# Patient Record
Sex: Male | Born: 1940
Health system: Southern US, Community
[De-identification: ages and names within clinical notes are randomized; demographics above are authoritative.]

## PROBLEM LIST (undated history)

## (undated) DIAGNOSIS — I2510 Atherosclerotic heart disease of native coronary artery without angina pectoris: Secondary | ICD-10-CM

## (undated) DIAGNOSIS — E7800 Pure hypercholesterolemia, unspecified: Secondary | ICD-10-CM

## (undated) DIAGNOSIS — R001 Bradycardia, unspecified: Secondary | ICD-10-CM

## (undated) DIAGNOSIS — I5189 Other ill-defined heart diseases: Secondary | ICD-10-CM

## (undated) DIAGNOSIS — H579 Unspecified disorder of eye and adnexa: Secondary | ICD-10-CM

## (undated) DIAGNOSIS — U071 COVID-19: Secondary | ICD-10-CM

## (undated) DIAGNOSIS — K635 Polyp of colon: Secondary | ICD-10-CM

## (undated) DIAGNOSIS — G4733 Obstructive sleep apnea (adult) (pediatric): Secondary | ICD-10-CM

## (undated) DIAGNOSIS — R918 Other nonspecific abnormal finding of lung field: Secondary | ICD-10-CM

## (undated) DIAGNOSIS — H9190 Unspecified hearing loss, unspecified ear: Secondary | ICD-10-CM

## (undated) DIAGNOSIS — M25519 Pain in unspecified shoulder: Secondary | ICD-10-CM

## (undated) DIAGNOSIS — K449 Diaphragmatic hernia without obstruction or gangrene: Secondary | ICD-10-CM

## (undated) DIAGNOSIS — R600 Localized edema: Secondary | ICD-10-CM

## (undated) DIAGNOSIS — K219 Gastro-esophageal reflux disease without esophagitis: Secondary | ICD-10-CM

## (undated) DIAGNOSIS — R42 Dizziness and giddiness: Secondary | ICD-10-CM

## (undated) DIAGNOSIS — N419 Inflammatory disease of prostate, unspecified: Secondary | ICD-10-CM

## (undated) DIAGNOSIS — N400 Enlarged prostate without lower urinary tract symptoms: Secondary | ICD-10-CM

## (undated) DIAGNOSIS — I10 Essential (primary) hypertension: Secondary | ICD-10-CM

## (undated) HISTORY — PX: CORONARY ANGIOPLASTY: SHX604

## (undated) HISTORY — DX: Obstructive sleep apnea (adult) (pediatric): G47.33

## (undated) HISTORY — DX: Localized edema: R60.0

## (undated) HISTORY — DX: Dizziness and giddiness: R42

## (undated) HISTORY — DX: COVID-19: U07.1

## (undated) HISTORY — PX: EYE SURGERY: SHX253

## (undated) HISTORY — DX: Inflammatory disease of prostate, unspecified: N41.9

## (undated) HISTORY — DX: Polyp of colon: K63.5

## (undated) HISTORY — PX: OTHER SURGICAL HISTORY: SHX169

## (undated) HISTORY — DX: Essential (primary) hypertension: I10

## (undated) HISTORY — DX: Pain in unspecified shoulder: M25.519

## (undated) HISTORY — PX: TRANSURETHRAL RESECTION OF PROSTATE: SHX73

---

## 1999-04-28 ENCOUNTER — Ambulatory Visit (HOSPITAL_COMMUNITY): Admission: RE | Admit: 1999-04-28 | Discharge: 1999-04-28 | Payer: Self-pay | Admitting: Gastroenterology

## 1999-04-28 ENCOUNTER — Encounter (INDEPENDENT_AMBULATORY_CARE_PROVIDER_SITE_OTHER): Payer: Self-pay | Admitting: Specialist

## 2000-03-03 ENCOUNTER — Ambulatory Visit (HOSPITAL_COMMUNITY): Admission: RE | Admit: 2000-03-03 | Discharge: 2000-03-03 | Payer: Self-pay | Admitting: Otolaryngology

## 2000-03-03 ENCOUNTER — Encounter: Payer: Self-pay | Admitting: Otolaryngology

## 2000-12-12 ENCOUNTER — Inpatient Hospital Stay (HOSPITAL_COMMUNITY): Admission: EM | Admit: 2000-12-12 | Discharge: 2000-12-13 | Payer: Self-pay | Admitting: Emergency Medicine

## 2000-12-12 ENCOUNTER — Encounter: Payer: Self-pay | Admitting: Cardiology

## 2001-10-12 ENCOUNTER — Encounter (HOSPITAL_BASED_OUTPATIENT_CLINIC_OR_DEPARTMENT_OTHER): Payer: Self-pay | Admitting: Internal Medicine

## 2001-10-12 ENCOUNTER — Encounter: Admission: RE | Admit: 2001-10-12 | Discharge: 2001-10-12 | Payer: Self-pay | Admitting: Internal Medicine

## 2003-05-02 ENCOUNTER — Emergency Department (HOSPITAL_COMMUNITY): Admission: AD | Admit: 2003-05-02 | Discharge: 2003-05-03 | Payer: Self-pay

## 2003-05-02 ENCOUNTER — Encounter: Payer: Self-pay | Admitting: Emergency Medicine

## 2003-05-26 ENCOUNTER — Ambulatory Visit (HOSPITAL_COMMUNITY): Admission: RE | Admit: 2003-05-26 | Discharge: 2003-05-26 | Payer: Self-pay | Admitting: Internal Medicine

## 2003-05-26 ENCOUNTER — Encounter (HOSPITAL_BASED_OUTPATIENT_CLINIC_OR_DEPARTMENT_OTHER): Payer: Self-pay | Admitting: Internal Medicine

## 2003-09-25 ENCOUNTER — Emergency Department (HOSPITAL_COMMUNITY): Admission: AD | Admit: 2003-09-25 | Discharge: 2003-09-25 | Payer: Self-pay | Admitting: Family Medicine

## 2006-10-31 ENCOUNTER — Encounter: Admission: RE | Admit: 2006-10-31 | Discharge: 2006-10-31 | Payer: Self-pay | Admitting: Internal Medicine

## 2007-01-13 ENCOUNTER — Emergency Department (HOSPITAL_COMMUNITY): Admission: EM | Admit: 2007-01-13 | Discharge: 2007-01-13 | Payer: Self-pay | Admitting: Family Medicine

## 2007-12-27 ENCOUNTER — Encounter: Admission: RE | Admit: 2007-12-27 | Discharge: 2007-12-27 | Payer: Self-pay | Admitting: Internal Medicine

## 2010-11-19 ENCOUNTER — Ambulatory Visit
Admission: RE | Admit: 2010-11-19 | Discharge: 2010-11-19 | Disposition: A | Discharge status: Home / Self Care | Payer: 59 | Source: Ambulatory Visit | Attending: Urology | Admitting: Urology

## 2010-11-19 ENCOUNTER — Other Ambulatory Visit: Payer: Self-pay | Admitting: Urology

## 2010-11-19 DIAGNOSIS — N400 Enlarged prostate without lower urinary tract symptoms: Secondary | ICD-10-CM

## 2010-11-24 ENCOUNTER — Other Ambulatory Visit: Payer: Self-pay | Admitting: Urology

## 2010-11-24 ENCOUNTER — Ambulatory Visit (HOSPITAL_BASED_OUTPATIENT_CLINIC_OR_DEPARTMENT_OTHER)
Admission: RE | Admit: 2010-11-24 | Discharge: 2010-11-25 | Disposition: A | Discharge status: Home / Self Care | Payer: 59 | Source: Ambulatory Visit | Attending: Urology | Admitting: Urology

## 2010-11-24 DIAGNOSIS — N138 Other obstructive and reflux uropathy: Secondary | ICD-10-CM | POA: Insufficient documentation

## 2010-11-24 DIAGNOSIS — N401 Enlarged prostate with lower urinary tract symptoms: Secondary | ICD-10-CM | POA: Insufficient documentation

## 2010-11-24 DIAGNOSIS — Z01812 Encounter for preprocedural laboratory examination: Secondary | ICD-10-CM | POA: Insufficient documentation

## 2010-12-14 NOTE — Op Note (Signed)
[name], [name]             ACCOUNT NO.:  [id number]  MEDICAL RECORD NO.:  [id number]          PATIENT TYPE:  AMB  LOCATION:                               FACILITY:  [facility]  PHYSICIAN:  Bertram Millard. Ulice Follett, M.D.DATE OF BIRTH:  02/14/1941  DATE OF PROCEDURE:  11/24/2010 DATE OF DISCHARGE:                              OPERATIVE REPORT   POSTOPERATIVE DIAGNOSIS:  Benign prostatic hypertrophy with obstruction, symptomatic.  POSTOPERATIVE DIAGNOSIS:  Benign prostatic hypertrophy with obstruction, symptomatic.  PRINCIPLE PROCEDURES:  Transurethral resection of the prostate with gyrus.  SURGEON:  Bertram Millard. Burdette Forehand, M.D.  ANESTHESIA:  General with LMA.  COMPLICATIONS:  None.  SPECIMEN:  Prostate chips to Pathology.  ESTIMATED BLOOD LOSS:  Less than 300 cc.  DRAINS:  24-French three-way Foley catheter.  BRIEF HISTORY:  [name] is a nice 70 year old male who I have been following for years.  He had symptomatic BPH, and underwent TUNA procedure in August 2004.  The patient has had symptomatic worsening, and has been on medical therapy.  Because of this large gland, and his worsening symptoms, he has desired surgical management so he can get off the medications.  The risks and complications of TURP have been discussed with the patient and his wife.  They understand these and desire to proceed.  DESCRIPTION OF PROCEDURE:  The patient was identified in the holding area and received preoperative IV antibiotics.  He was taken to the operating room where general anesthetic was administered using LMA.  He was placed in the dorsal lithotomy position.  Genitalia and perineum were prepped and draped.  Time-out was then performed.  The procedure then commenced.  A 28-French resectoscope sheath was placed with the obturator.  Inspection of the bladder revealed some trabeculations, the ureteral orifices were carefully identified in their position to the proximity of the bladder  neck.  The rest of the bladder was found to be normal.  Resection was then performed, first in the midline with taking down the median lobe which was somewhat small.  The cutting loop was used with the gyrus device.  The resection was taken down to the capsular fibers between the 5 o'clock and 7 o'clock position.  I then turned resection anteriorly to the 12 o'clock position.  I then moved from the 12 o'clock position counterclockwise, resecting the right prostatic lobe, again down to capsular fibers.  Some small perforations were made and some periprostatic fat was identified and small little bleeders coagulated.  The majority of the right lobe was taken down, carefully down to the surgical capsule.  I then performed the same procedure on the left lateral lobe, which was somewhat smaller than the right.  Again, resection was taken down to the surgical capsule.  Small bleeders were carefully electrocoagulated.  The chips were evacuated from the bladder.  Following completion of the resection, the fossa was neatly trimmed, and then all small bleeders carefully coagulated.  There was some bleeding from the sinus vessels on the right lobe in the small capsular perforation.  All chips were then irrigated again from the bladder, the prostate was found to be hemostatic and  the scope was removed.  I placed a 24-French three-way Foley catheter and filled the balloon with 45 cc of water.  This was then hooked to dependent drainage with overhead irrigation, with a slight amount of traction.  The patient tolerated the procedure well.  He was awakened and taken to the PACU in stable condition.  Chips were sent to Pathology for permanent section.     Bertram Millard. Macai Sisneros, M.D.     SMD/MEDQ  D:  11/24/2010  T:  11/24/2010  Job:  [id number]  cc:   Dr. Ivery Quale  Electronically Signed by Marcine Matar M.D. on 12/14/2010 01:20:33 PM

## 2011-01-14 NOTE — Cardiovascular Report (Signed)
[place]. [facility]  Patient:    [name], [name]                        MRN: [id number] Proc. Date: 12/13/00 Adm. Date:  [id number] Disc. Date: [id number] Attending:  Eleanora Neighbor CC:         Dr. Ivery Quale   Cardiac Catheterization  PROCEDURE:  Left heart catheterization with selective coronary angiography, left ventricular angiography.  INDICATIONS:  [name] presents with anterior chest pain.  He had myocardial infarction ruled out and is here for cardiac catheterization.  TYPE AND SITE OF ENTRY:  Percutaneous right femoral artery.  CATHETERS:  A 6 French 4 Judkins curved right and left coronary catheters, 6 French pigtail ventriculographic catheter.  CONTRAST MATERIAL:  Omnipaque.  MEDICATIONS GIVEN PRIOR TO THE PROCEDURE:  Valium 10 mg p.o.  MEDICATIONS GIVEN DURING THE PROCEDURE:  Versed 2 mg IV.  COMMENTS:  The patient tolerated the procedure well.  HEMODYNAMIC DATA:  The aortic pressure was 99/61, LV pressure was 102/17. There was no aortic valve gradient noted on pullback.  ANGIOGRAPHIC DATA:  The left ventricular angiogram was performed in the RAO position.  Overall, cardiac size and silhouette are normal.  Left ventricular function is normal.  The global ejection fraction is 60-65%.  A arteriogram demonstrated puncture that was probably just superior to the bifurcation of the femoral artery.  It was felt that the Perclose was not necessarily appropriate.  CORONARY ARTERIES: 1. Right coronary artery:  The right coronary artery has minor irregularities but is essentially normal.  It is a large dominant vessel.  2. Left main:  The left main coronary artery is normal.  3. Left anterior descending:  The left anterior descending has minor irregularities proximally but is essentially normal.  4. Left circumflex:  The left circumflex is normal.  5. Intermediate coronary:  The intermediate coronary has no  significant obstructive disease and is essentially normal.  OVERALL IMPRESSION: 1. Normal left ventricular function. 2. Essentially normal coronary arteries with minimal early atherosclerotic    irregularity.  PLAN:  It is felt that [name] is at low risk for coronary events.  We will continue to encourage him to modify cardiovascular risk factors. DD:  12/13/00 TD:  12/14/00 Job: [id number]

## 2011-01-14 NOTE — Discharge Summary (Signed)
[place]. [facility]  Patient:    CORNELIS, KLUVER                        MRN: [id number] Adm. Date:  [id number] Disc. Date: [id number] Attending:  Eleanora Neighbor Dictator:   Jennet Maduro. Earl Gala, R.N., A.N.P. CC:         Dr. Ivery Quale                           Discharge Summary  HISTORY OF PRESENT ILLNESS:  Mr. Cuthrell is a 70 year old white male who has basically been in good physical health with really no previous cardiac problems, who presents to the hospital with a one-week history of a sharp substernal chest discomfort which has been somewhat off and on.  He noted on the day prior to admission that he felt like his left arm was somewhat asleep. There was really no shortness of breath but he has had some relation with his discomfort in regard with exercise over the previous weekend.  There has been no real radiation of the discomfort, no clamminess, no nausea, no vomiting. He was treated with nitroglycerin in the emergency room with prompt relief. He was subsequently admitted for further evaluation.  PAST MEDICAL HISTORY:  This is significant for vasectomy, BPH, and gastroesophageal reflux disease.  LABORATORY DATA ON ADMISSION:  Cardiac enzymes were negative.  EKG showed normal sinus rhythm, it was nonacute.  CBC was all within normal limits. Chemistries were all within normal limits as well.  Guaiac stool was negative.  A portable chest x-ray showed no acute pulmonary disease.  HOSPITAL COURSE:  The patient was admitted to telemetry.  He was placed on IV nitroglycerin as well as IV heparin.  We preceded on with cardiac catheterization the following day per Colleen Can. Deborah Chalk, M.D.  This procedure was tolerated well without any known complications.  Left ventricular function is normal. The coronaries demonstrate only minor irregularities.  There is no frank obstructive disease.  His symptoms are not felt to be from a cardiac origin and he is  felt to be stable from a cardiac standpoint.  Post procedure he was transferred back to 5100.  His nitroglycerin and heparin were discontinued and plans were now, after his bedrest is complete and if the groin remains stable, he will be stable for discharge this afternoon.  CONDITION ON DISCHARGE:  Stable.  DISCHARGE MEDICATIONS:  Have advised him to take a coated aspirin daily.  He may take Tylenol as needed for any discomfort.  He may resume his Proscar and Flomax as he was taking before.  ACTIVITY:  Light.  He is not to return to work until this coming Friday.  DIET:  Low fat.  FOLLOW-UP:  He is to call our office for any problems with his groin.  That number is made available to him.  Otherwise, will have him plan to follow up with Dr. Ivery Quale in the next one to two weeks. DD:  12/13/00 TD:  12/14/00 Job: [id number]

## 2011-02-23 ENCOUNTER — Ambulatory Visit (HOSPITAL_BASED_OUTPATIENT_CLINIC_OR_DEPARTMENT_OTHER)
Admission: RE | Admit: 2011-02-23 | Discharge: 2011-02-23 | Disposition: A | Discharge status: Home / Self Care | Payer: 59 | Source: Ambulatory Visit | Attending: Urology | Admitting: Urology

## 2011-02-23 DIAGNOSIS — R42 Dizziness and giddiness: Secondary | ICD-10-CM | POA: Insufficient documentation

## 2011-02-23 DIAGNOSIS — Z7982 Long term (current) use of aspirin: Secondary | ICD-10-CM | POA: Insufficient documentation

## 2011-02-23 DIAGNOSIS — N35919 Unspecified urethral stricture, male, unspecified site: Secondary | ICD-10-CM | POA: Insufficient documentation

## 2011-02-23 DIAGNOSIS — G4733 Obstructive sleep apnea (adult) (pediatric): Secondary | ICD-10-CM | POA: Insufficient documentation

## 2011-02-23 DIAGNOSIS — Z79899 Other long term (current) drug therapy: Secondary | ICD-10-CM | POA: Insufficient documentation

## 2011-02-23 DIAGNOSIS — E7800 Pure hypercholesterolemia, unspecified: Secondary | ICD-10-CM | POA: Insufficient documentation

## 2011-02-23 DIAGNOSIS — K219 Gastro-esophageal reflux disease without esophagitis: Secondary | ICD-10-CM | POA: Insufficient documentation

## 2011-02-24 NOTE — Op Note (Signed)
  [name], [name]                 ACCOUNT NO.:  [id number]  MEDICAL RECORD NO.:  [id number]  LOCATION:                                 FACILITY:  PHYSICIAN:  [name], M.D.DATE OF BIRTH:  November 24, 1940  DATE OF PROCEDURE:  02/23/2011 DATE OF DISCHARGE:                              OPERATIVE REPORT   PREOPERATIVE DIAGNOSIS:  Urethral strictures, status post transurethral resection of the prostate.  POSTOPERATIVE DIAGNOSES:  Urethral strictures, status post transurethral resection of the prostate.  PRINCIPAL PROCEDURES:  Cystoscopy, balloon dilatation of urethral stricture.  SURGEON:  Donald Millard. Judea Riches, MD  ANESTHESIA:  General with LMA.  COMPLICATIONS:  None.  BRIEF HISTORY:  [name] is a 70 year old male who underwent TURP for BPH just 3 months ago.  He had 33 g of tissue resected.  Initially, he voided well, but he has been having problems with intermittency and feeling of incomplete emptying recently.  He was found to have a urethral stricture on cystoscopy recently.  He presents at this time for balloon dilatation of a significant urethral stricture.  Risks and complications have been discussed with the patient and are understood. He desires to proceed.  DESCRIPTION OF PROCEDURE:  The patient was identified in the holding area and received preoperative IV antibiotics.  He was taken to the operating room where general anesthetic was administered with LMA.  He is placed in the dorsal lithotomy position.  Genitalia and perineum were prepped and draped.  Time-out was then performed.  The procedure then commenced.  A 22-French panendoscope was advanced up to the urethra to the very dense stricture.  This was in the bulbous urethra.  I passed a guidewire through the stricture, which was negotiated, passed this very easily up into the bladder which was confirmed using fluoroscopy.  I then passed a 15-mm balloon, 10 cm through the stricture, and using  fluoroscopic guidance, filled the balloon to 20 atmospheres with saline.  This was left inflated for approximately 3 minutes.  The balloon was then deflated and the scope advanced.  This showed an excellent opening of the urethra, which easily admitted 22-French Foley.  The prostatic urethra was wide open without evidence of regrowth of any BPH.  The bladder was inspected.  It was quite full of urine, but no lesions were seen.  There were mild trabeculations.  At this point, the bladder was drained.  The urethra was dilated to 28-French with sounds and then a Foley catheter was placed and hooked to dependent drainage to a leg bag.  The patient tolerated procedure well.  He was awakened, extubated and taken to PACU in stable condition.     Donald Millard. Nishawn Rotan, M.D.     SMD/MEDQ  D:  02/23/2011  T:  02/23/2011  Job:  [id number]  cc:   Barry Dienes. Eloise Harman, M.D. Fax: [phone]  Electronically Signed by Marcine Matar M.D. on 02/24/2011 82:95:62 AM

## 2011-11-17 ENCOUNTER — Other Ambulatory Visit: Payer: Self-pay | Admitting: Dermatology

## 2011-12-09 DIAGNOSIS — H5010 Unspecified exotropia: Secondary | ICD-10-CM | POA: Insufficient documentation

## 2011-12-09 DIAGNOSIS — H269 Unspecified cataract: Secondary | ICD-10-CM | POA: Insufficient documentation

## 2011-12-09 DIAGNOSIS — H35379 Puckering of macula, unspecified eye: Secondary | ICD-10-CM | POA: Insufficient documentation

## 2012-01-20 DIAGNOSIS — H532 Diplopia: Secondary | ICD-10-CM | POA: Insufficient documentation

## 2012-04-26 DIAGNOSIS — Z79899 Other long term (current) drug therapy: Secondary | ICD-10-CM | POA: Diagnosis not present

## 2012-04-26 DIAGNOSIS — Z7982 Long term (current) use of aspirin: Secondary | ICD-10-CM | POA: Diagnosis not present

## 2012-04-26 DIAGNOSIS — H35379 Puckering of macula, unspecified eye: Secondary | ICD-10-CM | POA: Diagnosis not present

## 2012-05-18 DIAGNOSIS — Z23 Encounter for immunization: Secondary | ICD-10-CM | POA: Diagnosis not present

## 2012-05-25 DIAGNOSIS — N400 Enlarged prostate without lower urinary tract symptoms: Secondary | ICD-10-CM | POA: Diagnosis not present

## 2012-05-25 DIAGNOSIS — R972 Elevated prostate specific antigen [PSA]: Secondary | ICD-10-CM | POA: Diagnosis not present

## 2012-05-25 DIAGNOSIS — N35919 Unspecified urethral stricture, male, unspecified site: Secondary | ICD-10-CM | POA: Diagnosis not present

## 2012-06-06 DIAGNOSIS — H5010 Unspecified exotropia: Secondary | ICD-10-CM | POA: Diagnosis not present

## 2012-06-06 DIAGNOSIS — H269 Unspecified cataract: Secondary | ICD-10-CM | POA: Diagnosis not present

## 2012-06-06 DIAGNOSIS — H35379 Puckering of macula, unspecified eye: Secondary | ICD-10-CM | POA: Diagnosis not present

## 2012-11-16 DIAGNOSIS — H269 Unspecified cataract: Secondary | ICD-10-CM | POA: Diagnosis not present

## 2012-11-16 DIAGNOSIS — H35379 Puckering of macula, unspecified eye: Secondary | ICD-10-CM | POA: Diagnosis not present

## 2012-11-16 DIAGNOSIS — H5010 Unspecified exotropia: Secondary | ICD-10-CM | POA: Diagnosis not present

## 2012-11-16 DIAGNOSIS — H532 Diplopia: Secondary | ICD-10-CM | POA: Diagnosis not present

## 2013-02-20 DIAGNOSIS — Z125 Encounter for screening for malignant neoplasm of prostate: Secondary | ICD-10-CM | POA: Diagnosis not present

## 2013-02-20 DIAGNOSIS — I10 Essential (primary) hypertension: Secondary | ICD-10-CM | POA: Diagnosis not present

## 2013-02-20 DIAGNOSIS — E785 Hyperlipidemia, unspecified: Secondary | ICD-10-CM | POA: Diagnosis not present

## 2013-02-27 DIAGNOSIS — Z0000 Encounter for general adult medical examination without abnormal findings: Secondary | ICD-10-CM | POA: Diagnosis not present

## 2013-02-27 DIAGNOSIS — Z6827 Body mass index (BMI) 27.0-27.9, adult: Secondary | ICD-10-CM | POA: Diagnosis not present

## 2013-02-27 DIAGNOSIS — I10 Essential (primary) hypertension: Secondary | ICD-10-CM | POA: Diagnosis not present

## 2013-02-27 DIAGNOSIS — K219 Gastro-esophageal reflux disease without esophagitis: Secondary | ICD-10-CM | POA: Diagnosis not present

## 2013-02-27 DIAGNOSIS — G4733 Obstructive sleep apnea (adult) (pediatric): Secondary | ICD-10-CM | POA: Diagnosis not present

## 2013-02-27 DIAGNOSIS — Z79899 Other long term (current) drug therapy: Secondary | ICD-10-CM | POA: Diagnosis not present

## 2013-02-27 DIAGNOSIS — Z1331 Encounter for screening for depression: Secondary | ICD-10-CM | POA: Diagnosis not present

## 2013-02-27 DIAGNOSIS — E785 Hyperlipidemia, unspecified: Secondary | ICD-10-CM | POA: Diagnosis not present

## 2013-03-06 DIAGNOSIS — G4733 Obstructive sleep apnea (adult) (pediatric): Secondary | ICD-10-CM | POA: Diagnosis not present

## 2013-03-12 DIAGNOSIS — L570 Actinic keratosis: Secondary | ICD-10-CM | POA: Diagnosis not present

## 2013-03-12 DIAGNOSIS — L821 Other seborrheic keratosis: Secondary | ICD-10-CM | POA: Diagnosis not present

## 2013-03-12 DIAGNOSIS — L719 Rosacea, unspecified: Secondary | ICD-10-CM | POA: Diagnosis not present

## 2013-03-12 DIAGNOSIS — D2370 Other benign neoplasm of skin of unspecified lower limb, including hip: Secondary | ICD-10-CM | POA: Diagnosis not present

## 2013-03-12 DIAGNOSIS — D1801 Hemangioma of skin and subcutaneous tissue: Secondary | ICD-10-CM | POA: Diagnosis not present

## 2013-05-17 DIAGNOSIS — Z23 Encounter for immunization: Secondary | ICD-10-CM | POA: Diagnosis not present

## 2013-05-21 DIAGNOSIS — H35379 Puckering of macula, unspecified eye: Secondary | ICD-10-CM | POA: Diagnosis not present

## 2013-05-21 DIAGNOSIS — H5010 Unspecified exotropia: Secondary | ICD-10-CM | POA: Diagnosis not present

## 2013-05-21 DIAGNOSIS — H532 Diplopia: Secondary | ICD-10-CM | POA: Diagnosis not present

## 2013-05-21 DIAGNOSIS — H269 Unspecified cataract: Secondary | ICD-10-CM | POA: Diagnosis not present

## 2013-05-24 DIAGNOSIS — R3915 Urgency of urination: Secondary | ICD-10-CM | POA: Diagnosis not present

## 2013-05-24 DIAGNOSIS — N400 Enlarged prostate without lower urinary tract symptoms: Secondary | ICD-10-CM | POA: Diagnosis not present

## 2013-06-27 DIAGNOSIS — H903 Sensorineural hearing loss, bilateral: Secondary | ICD-10-CM | POA: Diagnosis not present

## 2013-08-20 DIAGNOSIS — H35379 Puckering of macula, unspecified eye: Secondary | ICD-10-CM | POA: Diagnosis not present

## 2013-08-20 DIAGNOSIS — H269 Unspecified cataract: Secondary | ICD-10-CM | POA: Diagnosis not present

## 2013-08-20 DIAGNOSIS — H5010 Unspecified exotropia: Secondary | ICD-10-CM | POA: Diagnosis not present

## 2013-08-20 DIAGNOSIS — H532 Diplopia: Secondary | ICD-10-CM | POA: Diagnosis not present

## 2013-08-26 DIAGNOSIS — H35359 Cystoid macular degeneration, unspecified eye: Secondary | ICD-10-CM | POA: Insufficient documentation

## 2013-08-29 HISTORY — PX: PARS PLANA VITRECTOMY: SHX2166

## 2013-09-05 ENCOUNTER — Encounter (HOSPITAL_COMMUNITY): Payer: Self-pay | Admitting: Emergency Medicine

## 2013-09-05 ENCOUNTER — Emergency Department (HOSPITAL_COMMUNITY): Payer: Medicare Other

## 2013-09-05 ENCOUNTER — Emergency Department (HOSPITAL_COMMUNITY)
Admission: EM | Admit: 2013-09-05 | Discharge: 2013-09-05 | Disposition: A | Discharge status: Home / Self Care | Payer: Medicare Other | Attending: Emergency Medicine | Admitting: Emergency Medicine

## 2013-09-05 DIAGNOSIS — Z8719 Personal history of other diseases of the digestive system: Secondary | ICD-10-CM | POA: Diagnosis not present

## 2013-09-05 DIAGNOSIS — E785 Hyperlipidemia, unspecified: Secondary | ICD-10-CM | POA: Diagnosis not present

## 2013-09-05 DIAGNOSIS — R079 Chest pain, unspecified: Secondary | ICD-10-CM

## 2013-09-05 HISTORY — DX: Unspecified disorder of eye and adnexa: H57.9

## 2013-09-05 HISTORY — DX: Diaphragmatic hernia without obstruction or gangrene: K44.9

## 2013-09-05 HISTORY — DX: Unspecified hearing loss, unspecified ear: H91.90

## 2013-09-05 HISTORY — DX: Benign prostatic hyperplasia without lower urinary tract symptoms: N40.0

## 2013-09-05 HISTORY — DX: Pure hypercholesterolemia, unspecified: E78.00

## 2013-09-05 HISTORY — DX: Gastro-esophageal reflux disease without esophagitis: K21.9

## 2013-09-05 LAB — BASIC METABOLIC PANEL
BUN: 14 mg/dL (ref 6–23)
CHLORIDE: 102 meq/L (ref 96–112)
CO2: 20 mEq/L (ref 19–32)
Calcium: 9.8 mg/dL (ref 8.4–10.5)
Creatinine, Ser: 0.91 mg/dL (ref 0.50–1.35)
GFR calc non Af Amer: 82 mL/min — ABNORMAL LOW (ref >90)
Glucose, Bld: 110 mg/dL — ABNORMAL HIGH (ref 70–99)
POTASSIUM: 4.4 meq/L (ref 3.7–5.3)
Sodium: 139 mEq/L (ref 137–147)

## 2013-09-05 LAB — CBC WITH DIFFERENTIAL/PLATELET
BASOS ABS: 0 10*3/uL (ref 0.0–0.1)
BASOS PCT: 1 % (ref 0–1)
Eosinophils Absolute: 0.1 10*3/uL (ref 0.0–0.7)
Eosinophils Relative: 2 % (ref 0–5)
HCT: 44.2 % (ref 39.0–52.0)
HEMOGLOBIN: 15.4 g/dL (ref 13.0–17.0)
Lymphocytes Relative: 11 % — ABNORMAL LOW (ref 12–46)
Lymphs Abs: 0.8 10*3/uL (ref 0.7–4.0)
MCH: 32.6 pg (ref 26.0–34.0)
MCHC: 34.8 g/dL (ref 30.0–36.0)
MCV: 93.4 fL (ref 78.0–100.0)
Monocytes Absolute: 0.8 10*3/uL (ref 0.1–1.0)
Monocytes Relative: 11 % (ref 3–12)
NEUTROS ABS: 5.5 10*3/uL (ref 1.7–7.7)
NEUTROS PCT: 76 % (ref 43–77)
Platelets: 249 10*3/uL (ref 150–400)
RBC: 4.73 MIL/uL (ref 4.22–5.81)
RDW: 12.6 % (ref 11.5–15.5)
WBC: 7.2 10*3/uL (ref 4.0–10.5)

## 2013-09-05 LAB — POCT I-STAT TROPONIN I: TROPONIN I, POC: 0.01 ng/mL (ref 0.00–0.08)

## 2013-09-05 LAB — TROPONIN I: Troponin I: <0.3 ng/mL (ref <0.30)

## 2013-09-05 MED ORDER — NITROGLYCERIN 0.4 MG SL SUBL
SUBLINGUAL_TABLET | SUBLINGUAL | Status: AC
  Filled 2013-09-05: qty 25

## 2013-09-05 MED ORDER — ROSUVASTATIN CALCIUM 20 MG PO TABS: 20.0000 mg | ORAL_TABLET | Freq: Every day | ORAL | Status: DC

## 2013-09-05 MED ORDER — ASPIRIN 81 MG PO CHEW
243.0000 mg | CHEWABLE_TABLET | Freq: Once | ORAL | Status: AC
  Administered 2013-09-05: 243 mg via ORAL
  Filled 2013-09-05: qty 3

## 2013-09-05 MED ORDER — IOHEXOL 350 MG/ML SOLN
80.0000 mL | Freq: Once | INTRAVENOUS | Status: AC | PRN
  Administered 2013-09-05: 80 mL via INTRAVENOUS

## 2013-09-05 NOTE — Discharge Instructions (Signed)
1. Medications: Crestor, usual home medications 2. Treatment: rest, drink plenty of fluids,  3. Follow Up: Please followup with your primary doctor for discussion of your diagnoses and further evaluation after today's visit; please also followup with Dr. [name] in the cardiology clinic for further evaluation of your chest pain   Chest Pain (Nonspecific)  HOME CARE INSTRUCTIONS  For the next few days, avoid physical activities that bring on chest pain. Continue physical activities as directed.  Do not smoke cigarettes or drink alcohol until your symptoms are gone.  Only take over-the-counter or prescription medicine for pain, discomfort, or fever as directed by your caregiver.  Follow your caregiver's suggestions for further testing if your chest pain does not go away.  Keep any follow-up appointments you made. If you do not go to an appointment, you could develop lasting (chronic) problems with pain. If there is any problem keeping an appointment, you must call to reschedule.  SEEK MEDICAL CARE IF:  You think you are having problems from the medicine you are taking. Read your medicine instructions carefully.  Your chest pain does not go away, even after treatment.  You develop a rash with blisters on your chest.  SEEK IMMEDIATE MEDICAL CARE IF:  You have increased chest pain or pain that spreads to your arm, neck, jaw, back, or belly (abdomen).  You develop shortness of breath, an increasing cough, or you are coughing up blood.  You have severe back or abdominal pain, feel sick to your stomach (nauseous) or throw up (vomit).  You develop severe weakness, fainting, or chills.  You have an oral temperature above 102 F (38.9 C), not controlled by medicine.   THIS IS AN EMERGENCY. Do not wait to see if the pain will go away. Get medical help at once. Call your local emergency services (911 in [place] Do not drive yourself to the hospital.

## 2013-09-05 NOTE — H&P (Signed)
History and Physical  Patient ID: [name] MRN: [id number], DOB: 03/20/1941 Date of Encounter: 09/05/2013, 2:40 PM Primary Physician: No primary provider on file. Primary Cardiologist: New, being seen by Dr. [name]  Chief Complaint: CP Reason for Admission: CP  HPI: [name] is a 73 y/o nonsmoking M with no prior cardiac history (essentially normal coronaries 2002), controlled GERD, HL who presented to [facility] with chest pain. He has no HTN or family history of CAD. He is active and exercises 2x/week at a local health club. Over the holidays he and his wife took a hiatus. They went back on Monday and exercised at a slower pace than usual but did well. Today he was on the treadmill doing well for about 30-40 minutes. However, towards the end of exercise while on an incline, he developed L sided chest pressure radiating to his shoulder blade. His wife notes he had mild SOB but he does not remember any significant breathing disturbance beyond usual exertion level. He denies any nausea, vomiting, palpitations, syncope, dizziness, lightheadedness or acute diaphoresis. He kept exercising for several minutes through the pain and it seemed to worsen slightly. He thought he'd better stopped so got off the treadmill. The pain persisted then gradually began to subside. His wife drove him to the ER. He was still having slight chest pain when he arrived but it is gone now. Total time of pain 15 minutes. Otherwise no acute symptoms, no LEE, recent travel, bedrest or surgery. He reports prior episodes of similar pain, occuring exclusively with harder treadmill exertions, but this doesn't always occur. ECG is unremarkable and labwork normal except glu 110, neg troponin x1. CXR shows small-mod hiatal hernia but otherwise nonacute. He took 81mg  ASA this AM then 243mg  in the ER.  Past Medical History  Diagnosis Date  . Acid reflux     a. well-controlled with ranitidine.  . High cholesterol   . BPH  (benign prostatic hyperplasia)     a. s/p TURP 2012.  Marland Kitchen Hard of hearing   . Eye problem     a. L eye blurred vision - seeing eye doctor for tissue pulling away from eye.  . Hiatal hernia     a. Small-mod by CXR 2015.     Most Recent Cardiac Studies: Cardiac Cath 2002 Cardiac Catheterization  PROCEDURE: Left heart catheterization with selective coronary angiography,  left ventricular angiography.  INDICATIONS: Mr. Salmi presents with anterior chest pain. He had myocardial  infarction ruled out and is here for cardiac catheterization.  TYPE AND SITE OF ENTRY: Percutaneous right femoral artery.  CATHETERS: A 6 French 4 Judkins curved right and left coronary catheters, 6  French pigtail ventriculographic catheter.  CONTRAST MATERIAL: Omnipaque.  MEDICATIONS GIVEN PRIOR TO THE PROCEDURE: Valium 10 mg p.o.  MEDICATIONS GIVEN DURING THE PROCEDURE: Versed 2 mg IV.  COMMENTS: The patient tolerated the procedure well.  HEMODYNAMIC DATA: The aortic pressure was 99/61, LV pressure was 102/17.  There was no aortic valve gradient noted on pullback.  ANGIOGRAPHIC DATA: The left ventricular angiogram was performed in the RAO  position. Overall, cardiac size and silhouette are normal. Left ventricular  function is normal. The global ejection fraction is 60-65%.  A arteriogram demonstrated puncture that was probably just superior to the  bifurcation of the femoral artery. It was felt that the Perclose was not  necessarily appropriate.  CORONARY ARTERIES:  1. Right coronary artery: The right coronary artery has minor irregularities  but is essentially  normal. It is a large dominant vessel.  2. Left main: The left main coronary artery is normal.  3. Left anterior descending: The left anterior descending has minor  irregularities proximally but is essentially normal.  4. Left circumflex: The left circumflex is normal.  5. Intermediate coronary: The intermediate coronary has no significant    obstructive disease and is essentially normal.  OVERALL IMPRESSION:  1. Normal left ventricular function.  2. Essentially normal coronary arteries with minimal early atherosclerotic  irregularity.  PLAN: It is felt that Mr. Mckoy is at low risk for coronary events. We will  continue to encourage him to modify cardiovascular risk factors.    Surgical History:  Past Surgical History  Procedure Laterality Date  . Urethral stricture surgery    . Transurethral resection of prostate       Home Meds: Prior to Admission medications   Medication Sig Start Date End Date Taking? Authorizing Provider  aspirin 81 MG tablet Take 81 mg by mouth daily.   Yes Historical Provider, MD  Bromfenac Sodium 0.09 % SOLN Place 1 drop into the left eye 2 (two) times daily. 08/20/13  Yes Historical Provider, MD  Calcium Carbonate-Vitamin D (CALTRATE 600+D PO) Take 1 tablet by mouth daily.   Yes Historical Provider, MD  ketorolac (ACULAR) 0.5 % ophthalmic solution Place 1 drop into the left eye 2 (two) times daily. 07/07/13  Yes Historical Provider, MD  meclizine (ANTIVERT) 25 MG tablet Take 25 mg by mouth daily. 06/05/13  Yes Historical Provider, MD  Multiple Vitamins-Minerals (CENTRUM SILVER PO) Take 1 tablet by mouth daily.   Yes Historical Provider, MD  Phosphatidylserine-DHA-EPA (VAYACOG PO) Take 1 capsule by mouth daily.   Yes Historical Provider, MD  pravastatin (PRAVACHOL) 40 MG tablet Take 40 mg by mouth daily. 06/05/13  Yes Historical Provider, MD  ranitidine (ZANTAC) 150 MG capsule Take 150 mg by mouth 2 (two) times daily. 09/03/13  Yes Historical Provider, MD    Allergies:  Allergies  Allergen Reactions  . Sulfa Antibiotics Nausea Only and Rash    History   Social History  . Marital Status: Married    Spouse Name: N/A    Number of Children: N/A  . Years of Education: N/A   Occupational History  . Not on file.   Social History Main Topics  . Smoking status: Never Smoker   . Smokeless  tobacco: Not on file  . Alcohol Use: Yes     Comment: Once per month  . Drug Use: No  . Sexual Activity: Not on file   Other Topics Concern  . Not on file   Social History Narrative  . No narrative on file     Family History  Problem Relation Age of Onset  . Stroke Father     Died at 10  . Other Mother     Died at 63 - "old age"  . CAD Neg Hx     Review of Systems: General: negative for chills, fever, night sweats or significant weight changes.  Cardiovascular: negative for chest pain, edema, orthopnea, palpitations, paroxysmal nocturnal dyspnea, shortness of breath or dyspnea on exertion Dermatological: negative for rash Respiratory: negative for cough or wheezing Urologic: negative for hematuria Abdominal: negative for nausea, vomiting, diarrhea, bright red blood per rectum, melena, or hematemesis Neurologic: negative for visual changes, syncope, or dizziness All other systems reviewed and are otherwise negative except as noted above.  Labs:   Lab Results  Component Value Date   WBC  7.2 09/05/2013   HGB 15.4 09/05/2013   HCT 44.2 09/05/2013   MCV 93.4 09/05/2013   PLT 249 09/05/2013     Recent Labs Lab 09/05/13 1045  NA 139  K 4.4  CL 102  CO2 20  BUN 14  CREATININE 0.91  CALCIUM 9.8  GLUCOSE 110*   POC troponin neg x 1  Radiology/Studies:  Dg Chest 2 View  09/05/2013   CLINICAL DATA:  Left-sided chest pain while exercising.  EXAM: CHEST  2 VIEW  COMPARISON:  DG CHEST 2 VIEW dated 11/19/2010  FINDINGS: Lateral view degraded by patient arm position. Midline trachea. Normal heart size with a small the moderate hiatal hernia. No pleural effusion or pneumothorax. Clear lungs.  IMPRESSION: Small to moderate hiatal hernia.  No acute cardiopulmonary disease.   Electronically Signed   By: Abigail Miyamoto M.D.   On: 09/05/2013 11:39    EKG: NSR 71bpm no acute ST-T changes  Physical Exam: Blood pressure 117/66, pulse 51, temperature 98.1 F (36.7 C), resp. rate 14, weight  187 lb (84.823 kg), SpO2 100.00%. General: Well developed, well nourished WM in no acute distress. Head: Normocephalic, atraumatic, sclera non-icteric, no xanthomas, nares are without discharge.  Neck: Negative for carotid bruits. JVD not elevated. Lungs: Clear bilaterally to auscultation without wheezes, rales, or rhonchi. Breathing is unlabored. Heart: RRR with S1 S2. No murmurs, rubs, or gallops appreciated. Abdomen: Soft, non-tender, non-distended with normoactive bowel sounds. No hepatomegaly. No rebound/guarding. No obvious abdominal masses. Msk:  Strength and tone appear normal for age. Extremities: No clubbing or cyanosis. No edema.  Distal pedal pulses are 2+ and equal bilaterally. Neuro: Alert and oriented X 3. No focal deficit. No facial asymmetry. Moves all extremities spontaneously. Psych:  Responds to questions appropriately with a normal affect.   ASSESSMENT AND PLAN:  1. Chest pain with typical/atypical features and no evidence of ischemia 2. Hyperlipidemia 3. Mild hyperglycemia 4. Small-mod hiatal hernia by CXR  Patient seen and examined with Dr. [name]. He would be an ideal candidate for cardiac CT for risk stratification. He will not require beta blocker prep due to HR in the 50's. If this is low risk, he would be a candidate for discharge from the ER with outpatient follow-up. If abnormal, he will need admission for cath tomorrow.    Signed, Melina Copa PA-C 09/05/2013, 2:40 PM  The patient was seen, examined and discussed with Melina Copa, PA-C and I agree with the above.  In summary, this is a 69 younger appearing gentleman who has been very active, exercising daily without any limitations. He developed rather atypical left sided sharp chest pain today that started after 30 minutes on treadmill and resolved 15 minutes later. Patient's risk factors include hyperlipidemia (treated with statin). No family h/o CAD, no smoking, hypertension. He denies DM, however he has mildly  elevated glucose today. He is currently asymptomatic, ECG is completely normal, first troponin is negative. We will proceed with coronary CT to evaluate for AS plague burden.   Continue ASA, pravastatin, no heparin for now, unless second troponin becomes negative. If coronary CT normal or non-obstructive CAD he can be discharged from the ED.  The patient was seen, examined and discussed with Melina Copa, PA-C and I agree with the above.  09/05/2013

## 2013-09-05 NOTE — ED Provider Notes (Signed)
CSN: [id number]     Arrival date & time 09/05/13  1019 History   First MD Initiated Contact with Patient 09/05/13 1028     Chief Complaint  Patient presents with  . Chest Pain   (Consider location/radiation/quality/duration/timing/severity/associated sxs/prior Treatment) The history is provided by the patient and medical records.   This is a 73 year old male with past medical history significant for hyperlipidemia, acid reflux, presented to the ED for chest pain. Patient states he exercises weekly at a local health club, today toward the end of the workout he was walking on the treadmill he began having some sharp, left-sided chest pain with radiation to the shoulder blade. He denies any radiation into the extremities or neck, palpitations, dizziness, weakness, nausea, or vomiting.  Pt was sweating and breathing heavily from the workout so unsure of diaphoresis or SOB.  Pt notes he has had episodes like this in the past, lasting <15 minutes with each occurrence, usually following exertional activities.  Pt was evaluated for CP once before-- cardiac catherization in 2002 with minimal atherosclerotic disease.  No hx of MI.  Pt is not a smoker.  VS stable on arrival.  Past Medical History  Diagnosis Date  . Acid reflux   . High cholesterol    No past surgical history on file. No family history on file. History  Substance Use Topics  . Smoking status: Never Smoker   . Smokeless tobacco: Not on file  . Alcohol Use: Yes    Review of Systems  Cardiovascular: Positive for chest pain.  All other systems reviewed and are negative.    Allergies  Sulfa antibiotics  Home Medications  No current outpatient prescriptions on file. BP 130/68  Pulse 72  Temp(Src) 98.1 F (36.7 C)  Resp 16  Wt 187 lb (84.823 kg)  SpO2 96%  Physical Exam  Nursing note and vitals reviewed. Constitutional: He is oriented to person, place, and time. He appears well-developed and well-nourished. No distress.   HENT:  Head: Normocephalic and atraumatic.  Mouth/Throat: Oropharynx is clear and moist.  Eyes: Conjunctivae and EOM are normal. Pupils are equal, round, and reactive to light.  Neck: Normal range of motion. Neck supple.  Cardiovascular: Normal rate, regular rhythm and normal heart sounds.   Pulmonary/Chest: Effort normal and breath sounds normal. No respiratory distress. He has no wheezes.  Musculoskeletal: Normal range of motion.  Neurological: He is alert and oriented to person, place, and time.  Skin: Skin is warm and dry. He is not diaphoretic.  Psychiatric: He has a normal mood and affect.    ED Course  Procedures (including critical care time)   Date: 09/05/2013  Rate: 71  Rhythm: normal sinus rhythm  QRS Axis: normal  Intervals: normal  ST/T Wave abnormalities: normal  Conduction Disutrbances:none  Narrative Interpretation:   Old EKG Reviewed: none available   Labs Review Labs Reviewed  CBC WITH DIFFERENTIAL - Abnormal; Notable for the following:    Lymphocytes Relative 11 (*)    All other components within normal limits  BASIC METABOLIC PANEL - Abnormal; Notable for the following:    Glucose, Bld 110 (*)    GFR calc non Af Amer 82 (*)    All other components within normal limits  POCT I-STAT TROPONIN I   Imaging Review Dg Chest 2 View  09/05/2013   CLINICAL DATA:  Left-sided chest pain while exercising.  EXAM: CHEST  2 VIEW  COMPARISON:  DG CHEST 2 VIEW dated 11/19/2010  FINDINGS: Lateral view  degraded by patient arm position. Midline trachea. Normal heart size with a small the moderate hiatal hernia. No pleural effusion or pneumothorax. Clear lungs.  IMPRESSION: Small to moderate hiatal hernia.  No acute cardiopulmonary disease.   Electronically Signed   By: Abigail Miyamoto M.D.   On: 09/05/2013 11:39    EKG Interpretation   None       MDM   1. Chest pain   2. Hyperlipidemia    EKG NSR, no acute ischemic changes.  Trop negative.  CXR clear.  Labs as above.   Pt has had intermittent episodes of exertional chest pain over the past few weeks without any recent cardiac testing-- this is concerning and i feel he would benefit from hospital admission for further evaluation and cycling of cardiac enzymes.  Spoke with pts PCP, Dr. Philip Aspen from [facility]-- states he agrees with assessment but would prefer cardiac admission.  Consulted cardiology-- they have seen and evaluated pt in the ED and ordered cardiac CT.  If negative, pt possibly could be discharged home with OP FU.  3:46 PM Cardiac CT pending.  Care signed out to [place] who will follow results.  Larene Pickett, PA-C 09/05/13 1547

## 2013-09-05 NOTE — ED Provider Notes (Signed)
Medical screening examination/treatment/procedure(s) were performed by non-physician practitioner and as supervising physician I was immediately available for consultation/collaboration.  EKG Interpretation   None         Ephraim Hamburger, MD 09/05/13 2034

## 2013-09-05 NOTE — ED Notes (Signed)
Patient transported to X-ray 

## 2013-09-05 NOTE — ED Notes (Signed)
Pt on home O2 at night at 2L/m/[place]

## 2013-09-05 NOTE — ED Provider Notes (Signed)
Care assumed from Donald Carnes, PA-C.    [name] presents with exertional CP on the treadmill at the gyn this AM.  Last cath 2002 with minimal dz.  Pt with no CP on arrival.  No associated symptoms.  PCP: [name] from [facility] eval complete with plan for cardiac CT and disposition accordingly.  Cardiac CT for risk stratification and assessment of plaque burden.  If reassuring then plan for d/c but will need to consult with Cards [name]).    Face to face Exam:   General: Awake  HEENT: Atraumatic  Resp: Normal effort  Cardiac: RRR, no murmur Abd: Nondistended  Neuro:No focal weakness  Lymph: No adenopathy    5:57 PM  CT initially read with no acute incidental noncardiac findings to account for the patient's symptoms. However approximately one hour later an addendum was added stating moderate diffuse nonobstructive CAD aggressive medical management recommended.  Will consult with cardiology.  6:50 PM Discussed with Dr. Meda Beasley he recommends patient stop pravastatin and begin Crestor. He is to followup with primary care and cardiology for further evaluation of chest pain. Patient alert, oriented, nontoxic and nonseptic appearing. Vital signs stable.  BP 110/53  Pulse 50  Temp(Src) 98.1 F (36.7 C)  Resp 15  Wt 187 lb (84.823 kg)  SpO2 100%   Donald Butts, PA-C 09/05/13 1851

## 2013-09-05 NOTE — ED Notes (Addendum)
Was exersizingg this am and had some cp into his back no n/v

## 2013-09-06 ENCOUNTER — Telehealth: Payer: Self-pay | Admitting: Cardiology

## 2013-09-06 NOTE — Telephone Encounter (Signed)
Pt is going to see Dr [name] for the first time on 09/23/13. He cant afford Creator and wants to know if it can be changed to a med that is less expensive. Please advise.

## 2013-09-06 NOTE — Telephone Encounter (Signed)
New message  The cost of crestor is $ 238.49 . Looking for alternative . Because insurance will not pay for it.

## 2013-09-06 NOTE — Telephone Encounter (Signed)
Yes, He can be changed to atorvastatin (LIpitor) that comes as generic. But I would like to see him first. [place] Siren

## 2013-09-09 NOTE — Telephone Encounter (Signed)
**Note De-Identified  Obfuscation** Per pt request his appt. with Dr [name] has been moved up to tomorrow at 3:15.

## 2013-09-10 ENCOUNTER — Ambulatory Visit (INDEPENDENT_AMBULATORY_CARE_PROVIDER_SITE_OTHER): Payer: Medicare Other | Admitting: Cardiology

## 2013-09-10 ENCOUNTER — Encounter: Payer: Self-pay | Admitting: Cardiology

## 2013-09-10 VITALS — BP 137/76 | HR 57 | Ht 70.0 in | Wt 190.0 lb

## 2013-09-10 DIAGNOSIS — E785 Hyperlipidemia, unspecified: Secondary | ICD-10-CM | POA: Diagnosis not present

## 2013-09-10 DIAGNOSIS — I2510 Atherosclerotic heart disease of native coronary artery without angina pectoris: Secondary | ICD-10-CM | POA: Diagnosis not present

## 2013-09-10 DIAGNOSIS — I10 Essential (primary) hypertension: Secondary | ICD-10-CM | POA: Diagnosis not present

## 2013-09-10 DIAGNOSIS — E7800 Pure hypercholesterolemia, unspecified: Secondary | ICD-10-CM

## 2013-09-10 HISTORY — DX: Essential (primary) hypertension: I10

## 2013-09-10 MED ORDER — ATORVASTATIN CALCIUM 40 MG PO TABS
40.0000 mg | ORAL_TABLET | Freq: Every day | ORAL | Status: DC
Start: 2013-09-10 — End: 2014-08-11

## 2013-09-10 NOTE — Patient Instructions (Addendum)
Your physician wants you to follow-up in: [place] will receive a reminder letter in the mail two months in advance. If you don't receive a letter, please call our office to schedule the follow-up appointment.  STOP CRESTOR  STOP PRAVASTATIN  Your physician recommends that you return for lab work in: [place] TO LAB WORK

## 2013-09-10 NOTE — ED Provider Notes (Signed)
Medical screening examination/treatment/procedure(s) were conducted as a shared visit with non-physician practitioner(s) and myself.  I personally evaluated the patient during the encounter.  EKG Interpretation    Date/Time:  Thursday September 05 2013 10:23:15 EST Ventricular Rate:  71 PR Interval:  158 QRS Duration: 84 QT Interval:  394 QTC Calculation: 428 R Axis:   22 Text Interpretation:  Normal sinus rhythm Normal ECG ED PHYSICIAN INTERPRETATION AVAILABLE IN [facility] Confirmed by TEST, RECORD ([id number]) on 09/07/2013 2:56:09 PM           Patient seen and examined for chest pain. Patient had pain after working out earlier today and does report intermittent exertional chest pain over the last several weeks. Patient will require further workup for this chest pain, cardiology consulted to determine treatment course.   Orpah Greek, MD 09/10/13 1058

## 2013-09-10 NOTE — Progress Notes (Signed)
Patient ID: [name], male   DOB: [birth year], 73 y.o.   MRN: [id number]     Patient Name: [name] Date of Encounter: 09/10/2013  Primary Care Provider:  [name], MD Primary Cardiologist:  Ena Dawley, H  Problem List   Past Medical History  Diagnosis Date  . Acid reflux     a. well-controlled with ranitidine.  . High cholesterol   . BPH (benign prostatic hyperplasia)     a. s/p TURP 2012.  Marland Kitchen Hard of hearing   . Eye problem     a. L eye blurred vision - seeing eye doctor for tissue pulling away from eye.  . Hiatal hernia     a. Small-mod by CXR 2015.   Past Surgical History  Procedure Laterality Date  . Urethral stricture surgery    . Transurethral resection of prostate     Allergies  Allergies  Allergen Reactions  . Sulfa Antibiotics Nausea Only and Rash   HPI  The patient seen in the ER on 09/05/2012 with chest pain after 40 minutes on the treadmill (please see consult note for full history). Coronary CT showed moderate non-obstructive CAD, discharged from the ED.  Home Medications  Prior to Admission medications   Medication Sig Start Date End Date Taking? Authorizing Provider  aspirin 81 MG tablet Take 81 mg by mouth daily.   Yes Historical Provider, MD  Bromfenac Sodium 0.09 % SOLN Place 1 drop into the left eye 2 (two) times daily. 08/20/13  Yes Historical Provider, MD  Calcium Carbonate-Vitamin D (CALTRATE 600+D PO) Take 1 tablet by mouth daily.   Yes Historical Provider, MD  ketorolac (ACULAR) 0.5 % ophthalmic solution Place 1 drop into the left eye 2 (two) times daily. 07/07/13  Yes Historical Provider, MD  meclizine (ANTIVERT) 25 MG tablet Take 25 mg by mouth daily. 06/05/13  Yes Historical Provider, MD  Multiple Vitamins-Minerals (CENTRUM SILVER PO) Take 1 tablet by mouth daily.   Yes Historical Provider, MD  Phosphatidylserine-DHA-EPA (VAYACOG PO) Take 1 capsule by mouth daily.   Yes Historical Provider, MD  pravastatin (PRAVACHOL) 40  MG tablet Take 40 mg by mouth daily. 06/05/13  Yes Historical Provider, MD  ranitidine (ZANTAC) 150 MG capsule Take 150 mg by mouth 2 (two) times daily. 09/03/13  Yes Historical Provider, MD  rosuvastatin (CRESTOR) 20 MG tablet Take 1 tablet (20 mg total) by mouth daily. 09/05/13   Jarrett Soho Muthersbaugh, PA-C   Family History  Family History  Problem Relation Age of Onset  . Stroke Father     Died at 7  . Other Mother     Died at 50 - "old age"  . CAD Neg Hx     Social History  History   Social History  . Marital Status: Married    Spouse Name: N/A    Number of Children: N/A  . Years of Education: N/A   Occupational History  . Not on file.   Social History Main Topics  . Smoking status: Never Smoker   . Smokeless tobacco: Not on file  . Alcohol Use: Yes     Comment: Once per month  . Drug Use: No  . Sexual Activity: Not on file   Other Topics Concern  . Not on file   Social History Narrative  . No narrative on file     Review of Systems, as per HPI, otherwise negative General:  No chills, fever, night sweats or weight changes.  Cardiovascular:  No  chest pain, dyspnea on exertion, edema, orthopnea, palpitations, paroxysmal nocturnal dyspnea. Dermatological: No rash, lesions/masses Respiratory: No cough, dyspnea Urologic: No hematuria, dysuria Abdominal:   No nausea, vomiting, diarrhea, bright red blood per rectum, melena, or hematemesis Neurologic:  No visual changes, wkns, changes in mental status. All other systems reviewed and are otherwise negative except as noted above.  Physical Exam  Blood pressure 137/76, pulse 57, height 5\' 10"  (1.778 m), weight 190 lb (86.183 kg).  General: Pleasant, NAD Psych: Normal affect. Neuro: Alert and oriented X 3. Moves all extremities spontaneously. HEENT: Normal  Neck: Supple without bruits or JVD. Lungs:  Resp regular and unlabored, CTA. Heart: RRR no s3, s4, or murmurs. Abdomen: Soft, non-tender, non-distended, BS + x 4.    Extremities: No clubbing, cyanosis or edema. DP/PT/Radials 2+ and equal bilaterally.  Labs:  No results found for this basename: CKTOTAL, CKMB, TROPONINI,  in the last 72 hours Lab Results  Component Value Date   WBC 7.2 09/05/2013   HGB 15.4 09/05/2013   HCT 44.2 09/05/2013   MCV 93.4 09/05/2013   PLT 249 09/05/2013    Recent Labs Lab 09/05/13 1045  NA 139  K 4.4  CL 102  CO2 20  BUN 14  CREATININE 0.91  CALCIUM 9.8  GLUCOSE 110*   Accessory Clinical Findings  Echocardiogram - none  ECG - SR, normal ECG   Assessment & Plan  1. 89 younger appearing gentleman who has been very active, exercising daily without any limitations. He developed rather atypical left sided sharp chest pain today that started after 30 minutes on treadmill and resolved 15 minutes later. Patient's risk factors include hyperlipidemia (treated with statin). No family h/o CAD, no smoking, hypertension. He denies DM, however he has mildly elevated glucose today.  He is currently asymptomatic, ECG is completely normal, first troponin is negative.  We will proceed with coronary CT to evaluate for AS plague burden.  Continue ASA, pravastatin, no heparin for now, unless second troponin becomes negative.  Moderate diffuse non-obstructive CAD. Aggressive medical management is recommended. We will start 40 mg atorvastatin QHS, follow lipids and LFTs in 3 weeks.  He is encouraged to continue exercing, but advised to buy a HR monitor and not to exceed HR 125 BPM, no need for stress testing at this point.  2. HTN - controlled  Follow up in 4 months    Dorothy Spark, MD, [facility] 09/10/2013, 3:30 PM

## 2013-09-23 ENCOUNTER — Encounter: Payer: 59 | Admitting: Cardiology

## 2013-10-03 ENCOUNTER — Other Ambulatory Visit (INDEPENDENT_AMBULATORY_CARE_PROVIDER_SITE_OTHER): Payer: Medicare Other

## 2013-10-03 DIAGNOSIS — E7800 Pure hypercholesterolemia, unspecified: Secondary | ICD-10-CM | POA: Diagnosis not present

## 2013-10-03 LAB — LIPID PANEL
Cholesterol: 113 mg/dL (ref 0–200)
HDL: 50.5 mg/dL (ref >39.00)
LDL Cholesterol: 48 mg/dL (ref 0–99)
Total CHOL/HDL Ratio: 2
Triglycerides: 72 mg/dL (ref 0.0–149.0)
VLDL: 14.4 mg/dL (ref 0.0–40.0)

## 2013-10-03 LAB — HEPATIC FUNCTION PANEL
ALT: 22 U/L (ref 0–53)
AST: 23 U/L (ref 0–37)
Albumin: 4 g/dL (ref 3.5–5.2)
Alkaline Phosphatase: 51 U/L (ref 39–117)
Bilirubin, Direct: 0.1 mg/dL (ref 0.0–0.3)
Total Bilirubin: 1 mg/dL (ref 0.3–1.2)
Total Protein: 6.6 g/dL (ref 6.0–8.3)

## 2013-12-03 DIAGNOSIS — H35359 Cystoid macular degeneration, unspecified eye: Secondary | ICD-10-CM | POA: Diagnosis not present

## 2013-12-03 DIAGNOSIS — H532 Diplopia: Secondary | ICD-10-CM | POA: Diagnosis not present

## 2013-12-03 DIAGNOSIS — H35379 Puckering of macula, unspecified eye: Secondary | ICD-10-CM | POA: Diagnosis not present

## 2013-12-31 DIAGNOSIS — Z87438 Personal history of other diseases of male genital organs: Secondary | ICD-10-CM | POA: Insufficient documentation

## 2013-12-31 DIAGNOSIS — K219 Gastro-esophageal reflux disease without esophagitis: Secondary | ICD-10-CM | POA: Insufficient documentation

## 2013-12-31 DIAGNOSIS — R42 Dizziness and giddiness: Secondary | ICD-10-CM | POA: Insufficient documentation

## 2013-12-31 DIAGNOSIS — I2510 Atherosclerotic heart disease of native coronary artery without angina pectoris: Secondary | ICD-10-CM | POA: Insufficient documentation

## 2013-12-31 DIAGNOSIS — E7800 Pure hypercholesterolemia, unspecified: Secondary | ICD-10-CM | POA: Insufficient documentation

## 2013-12-31 DIAGNOSIS — G4733 Obstructive sleep apnea (adult) (pediatric): Secondary | ICD-10-CM | POA: Insufficient documentation

## 2014-01-15 DIAGNOSIS — I2510 Atherosclerotic heart disease of native coronary artery without angina pectoris: Secondary | ICD-10-CM | POA: Diagnosis not present

## 2014-01-15 DIAGNOSIS — K219 Gastro-esophageal reflux disease without esophagitis: Secondary | ICD-10-CM | POA: Diagnosis not present

## 2014-01-15 DIAGNOSIS — H35379 Puckering of macula, unspecified eye: Secondary | ICD-10-CM | POA: Diagnosis not present

## 2014-01-15 DIAGNOSIS — Z882 Allergy status to sulfonamides status: Secondary | ICD-10-CM | POA: Diagnosis not present

## 2014-01-15 DIAGNOSIS — E7800 Pure hypercholesterolemia, unspecified: Secondary | ICD-10-CM | POA: Diagnosis not present

## 2014-01-15 DIAGNOSIS — H35359 Cystoid macular degeneration, unspecified eye: Secondary | ICD-10-CM | POA: Diagnosis not present

## 2014-01-15 DIAGNOSIS — Z9889 Other specified postprocedural states: Secondary | ICD-10-CM | POA: Diagnosis not present

## 2014-01-15 DIAGNOSIS — G4733 Obstructive sleep apnea (adult) (pediatric): Secondary | ICD-10-CM | POA: Diagnosis not present

## 2014-01-16 DIAGNOSIS — Z9889 Other specified postprocedural states: Secondary | ICD-10-CM | POA: Diagnosis not present

## 2014-01-16 DIAGNOSIS — H35359 Cystoid macular degeneration, unspecified eye: Secondary | ICD-10-CM | POA: Diagnosis not present

## 2014-01-16 DIAGNOSIS — H35379 Puckering of macula, unspecified eye: Secondary | ICD-10-CM | POA: Diagnosis not present

## 2014-01-24 DIAGNOSIS — H35379 Puckering of macula, unspecified eye: Secondary | ICD-10-CM | POA: Diagnosis not present

## 2014-01-24 DIAGNOSIS — H35359 Cystoid macular degeneration, unspecified eye: Secondary | ICD-10-CM | POA: Diagnosis not present

## 2014-02-06 ENCOUNTER — Encounter: Payer: Self-pay | Admitting: *Deleted

## 2014-02-07 ENCOUNTER — Ambulatory Visit: Payer: Medicare Other | Admitting: Cardiology

## 2014-02-10 ENCOUNTER — Encounter: Payer: Self-pay | Admitting: Cardiology

## 2014-02-10 ENCOUNTER — Ambulatory Visit (INDEPENDENT_AMBULATORY_CARE_PROVIDER_SITE_OTHER): Payer: Medicare Other | Admitting: Cardiology

## 2014-02-10 VITALS — BP 122/64 | HR 62 | Ht 70.0 in | Wt 192.0 lb

## 2014-02-10 DIAGNOSIS — I2510 Atherosclerotic heart disease of native coronary artery without angina pectoris: Secondary | ICD-10-CM

## 2014-02-10 NOTE — Patient Instructions (Signed)
Your physician recommends that you continue on your current medications as directed. Please refer to the Current Medication list given to you today.  Your physician recommends that you return for lab work in: today  Your physician wants you to follow-up in: 6 months. You will receive a reminder letter in the mail two months in advance. If you don't receive a letter, please call our office to schedule the follow-up appointment.   

## 2014-02-10 NOTE — Progress Notes (Signed)
Patient ID: MAYA SCHOLER, male   DOB: 1940/11/01, 73 y.o.   MRN: [id number]    Patient Name: [name] Date of Encounter: 02/10/2014  Primary Care Provider:  [name], MD Primary Cardiologist:  Dorothy Spark  Problem List   Past Medical History  Diagnosis Date  . Acid reflux     a. well-controlled with ranitidine.  . High cholesterol   . BPH (benign prostatic hyperplasia)     a. s/p TURP 2012.  Marland Kitchen Hard of hearing   . Eye problem     a. L eye blurred vision - seeing eye doctor for tissue pulling away from eye.  . Hiatal hernia     a. Small-mod by CXR 2015.  Marland Kitchen Essential hypertension 09/10/2013   Past Surgical History  Procedure Laterality Date  . Urethral stricture surgery    . Transurethral resection of prostate    . Pars plana vitrectomy Left 2015    WITH MEMBRANE PEEL, 23 GAUGE   Allergies  Allergies  Allergen Reactions  . Sulfa Antibiotics Nausea Only and Rash  . Sulfamethoxazole Nausea Only and Rash  . Sulfasalazine Nausea Only and Rash   HPI  The patient seen in the ER on 09/05/2012 with chest pain after 30 minutes on the treadmill (please see consult note for full history). Coronary CT showed moderate non-obstructive CAD, discharged from the ED. the patient was started on statins and is coming for her six-month followup. In the meanwhile he underwent retinal surgery and is doing well with exercise restriction. He is still exercising just avoiding bending down and lifting weights. He is completely asymptomatic and denies any side effects of Lipitor is concerned about impaired memory with Lipitor.  Home Medications  Prior to Admission medications   Medication Sig Start Date End Date Taking? Authorizing Provider  aspirin 81 MG tablet Take 81 mg by mouth daily.   Yes Historical Provider, MD  Bromfenac Sodium 0.09 % SOLN Place 1 drop into the left eye 2 (two) times daily. 08/20/13  Yes Historical Provider, MD  Calcium Carbonate-Vitamin D (CALTRATE 600+D  PO) Take 1 tablet by mouth daily.   Yes Historical Provider, MD  ketorolac (ACULAR) 0.5 % ophthalmic solution Place 1 drop into the left eye 2 (two) times daily. 07/07/13  Yes Historical Provider, MD  meclizine (ANTIVERT) 25 MG tablet Take 25 mg by mouth daily. 06/05/13  Yes Historical Provider, MD  Multiple Vitamins-Minerals (CENTRUM SILVER PO) Take 1 tablet by mouth daily.   Yes Historical Provider, MD  Phosphatidylserine-DHA-EPA (VAYACOG PO) Take 1 capsule by mouth daily.   Yes Historical Provider, MD  pravastatin (PRAVACHOL) 40 MG tablet Take 40 mg by mouth daily. 06/05/13  Yes Historical Provider, MD  ranitidine (ZANTAC) 150 MG capsule Take 150 mg by mouth 2 (two) times daily. 09/03/13  Yes Historical Provider, MD  rosuvastatin (CRESTOR) 20 MG tablet Take 1 tablet (20 mg total) by mouth daily. 09/05/13   Jarrett Soho Muthersbaugh, PA-C   Family History  Family History  Problem Relation Age of Onset  . Stroke Father     Died at 21  . Other Mother     Died at 21 - "old age"  . CAD Neg Hx     Social History  History   Social History  . Marital Status: Married    Spouse Name: N/A    Number of Children: N/A  . Years of Education: N/A   Occupational History  . Not on file.   Social  History Main Topics  . Smoking status: Never Smoker   . Smokeless tobacco: Not on file  . Alcohol Use: Yes     Comment: Once per month  . Drug Use: No  . Sexual Activity: Not on file   Other Topics Concern  . Not on file   Social History Narrative  . No narrative on file     Review of Systems, as per HPI, otherwise negative General:  No chills, fever, night sweats or weight changes.  Cardiovascular:  No chest pain, dyspnea on exertion, edema, orthopnea, palpitations, paroxysmal nocturnal dyspnea. Dermatological: No rash, lesions/masses Respiratory: No cough, dyspnea Urologic: No hematuria, dysuria Abdominal:   No nausea, vomiting, diarrhea, bright red blood per rectum, melena, or  hematemesis Neurologic:  No visual changes, wkns, changes in mental status. All other systems reviewed and are otherwise negative except as noted above.  Physical Exam  Blood pressure 122/64, pulse 62, height 5\' 10"  (1.778 m), weight 192 lb (87.091 kg), SpO2 96.00%.  General: Pleasant, NAD Psych: Normal affect. Neuro: Alert and oriented X 3. Moves all extremities spontaneously. HEENT: Normal  Neck: Supple without bruits or JVD. Lungs:  Resp regular and unlabored, CTA. Heart: RRR no s3, s4, or murmurs. Abdomen: Soft, non-tender, non-distended, BS + x 4.  Extremities: No clubbing, cyanosis or edema. DP/PT/Radials 2+ and equal bilaterally.  Labs:  No results found for this basename: CKTOTAL, CKMB, TROPONINI,  in the last 72 hours Lab Results  Component Value Date   WBC 7.2 09/05/2013   HGB 15.4 09/05/2013   HCT 44.2 09/05/2013   MCV 93.4 09/05/2013   PLT 249 09/05/2013   No results found for this basename: NA, K, CL, CO2, BUN, CREATININE, CALCIUM, LABALBU, PROT, BILITOT, ALKPHOS, ALT, AST, GLUCOSE,  in the last 168 hours Accessory Clinical Findings  Echocardiogram - none  ECG - SR, normal ECG   Assessment & Plan  1. 39 younger appearing gentleman who has been very active, exercising daily without any limitations. He developed rather atypical left sided sharp chest pain today that started after 30 minutes on treadmill and resolved 15 minutes later. Patient's risk factors include hyperlipidemia (treated with statin). No family h/o CAD, no smoking, hypertension. He denies DM, however he had mildly elevated glucose in the past.  He is currently asymptomatic, ECG is completely normal, cardiac CT showed Moderate diffuse non-obstructive CAD. Aggressive medical was management is recommended. Pravastatin was switched to 40 mg atorvastatin QHS, we will check LFTs today  He is encouraged to continue exercing, but advised to buy a HR monitor and not to exceed HR 125 BPM, no need for stress testing at  this point.  2. HTN - controlled  Follow up in 6 months    Dorothy Spark, MD, [facility] 02/10/2014, 9:10 AM

## 2014-02-11 LAB — COMPREHENSIVE METABOLIC PANEL
ALT: 22 IU/L (ref 0–44)
AST: 21 IU/L (ref 0–40)
Albumin/Globulin Ratio: 3 — ABNORMAL HIGH (ref 1.1–2.5)
Albumin: 4.5 g/dL (ref 3.5–4.8)
Alkaline Phosphatase: 68 IU/L (ref 39–117)
BUN/Creatinine Ratio: 13 (ref 10–22)
BUN: 12 mg/dL (ref 8–27)
CO2: 25 mmol/L (ref 18–29)
Calcium: 9.4 mg/dL (ref 8.6–10.2)
Chloride: 103 mmol/L (ref 97–108)
Creatinine, Ser: 0.93 mg/dL (ref 0.76–1.27)
GFR calc Af Amer: 93 mL/min/{1.73_m2} (ref >59)
GFR calc non Af Amer: 81 mL/min/{1.73_m2} (ref >59)
Globulin, Total: 1.5 g/dL (ref 1.5–4.5)
Glucose: 94 mg/dL (ref 65–99)
Potassium: 4.8 mmol/L (ref 3.5–5.2)
Sodium: 140 mmol/L (ref 134–144)
Total Bilirubin: 0.4 mg/dL (ref 0.0–1.2)
Total Protein: 6 g/dL (ref 6.0–8.5)

## 2014-02-14 ENCOUNTER — Encounter: Payer: Self-pay | Admitting: *Deleted

## 2014-02-27 DIAGNOSIS — Z79899 Other long term (current) drug therapy: Secondary | ICD-10-CM | POA: Diagnosis not present

## 2014-02-27 DIAGNOSIS — I10 Essential (primary) hypertension: Secondary | ICD-10-CM | POA: Diagnosis not present

## 2014-02-27 DIAGNOSIS — Z125 Encounter for screening for malignant neoplasm of prostate: Secondary | ICD-10-CM | POA: Diagnosis not present

## 2014-02-27 DIAGNOSIS — E785 Hyperlipidemia, unspecified: Secondary | ICD-10-CM | POA: Diagnosis not present

## 2014-03-07 DIAGNOSIS — Z0000 Encounter for general adult medical examination without abnormal findings: Secondary | ICD-10-CM | POA: Diagnosis not present

## 2014-03-07 DIAGNOSIS — M545 Low back pain, unspecified: Secondary | ICD-10-CM | POA: Diagnosis not present

## 2014-03-07 DIAGNOSIS — G4733 Obstructive sleep apnea (adult) (pediatric): Secondary | ICD-10-CM | POA: Diagnosis not present

## 2014-03-07 DIAGNOSIS — F29 Unspecified psychosis not due to a substance or known physiological condition: Secondary | ICD-10-CM | POA: Diagnosis not present

## 2014-03-07 DIAGNOSIS — Z1331 Encounter for screening for depression: Secondary | ICD-10-CM | POA: Diagnosis not present

## 2014-03-07 DIAGNOSIS — Z6827 Body mass index (BMI) 27.0-27.9, adult: Secondary | ICD-10-CM | POA: Diagnosis not present

## 2014-03-07 DIAGNOSIS — E785 Hyperlipidemia, unspecified: Secondary | ICD-10-CM | POA: Diagnosis not present

## 2014-03-07 DIAGNOSIS — Z79899 Other long term (current) drug therapy: Secondary | ICD-10-CM | POA: Diagnosis not present

## 2014-03-10 DIAGNOSIS — Z1212 Encounter for screening for malignant neoplasm of rectum: Secondary | ICD-10-CM | POA: Diagnosis not present

## 2014-04-29 DIAGNOSIS — D239 Other benign neoplasm of skin, unspecified: Secondary | ICD-10-CM | POA: Diagnosis not present

## 2014-04-29 DIAGNOSIS — D1801 Hemangioma of skin and subcutaneous tissue: Secondary | ICD-10-CM | POA: Diagnosis not present

## 2014-04-29 DIAGNOSIS — L570 Actinic keratosis: Secondary | ICD-10-CM | POA: Diagnosis not present

## 2014-04-29 DIAGNOSIS — L821 Other seborrheic keratosis: Secondary | ICD-10-CM | POA: Diagnosis not present

## 2014-05-22 DIAGNOSIS — Z23 Encounter for immunization: Secondary | ICD-10-CM | POA: Diagnosis not present

## 2014-05-30 DIAGNOSIS — R3915 Urgency of urination: Secondary | ICD-10-CM | POA: Diagnosis not present

## 2014-05-30 DIAGNOSIS — Z87448 Personal history of other diseases of urinary system: Secondary | ICD-10-CM | POA: Diagnosis not present

## 2014-05-30 DIAGNOSIS — L720 Epidermal cyst: Secondary | ICD-10-CM | POA: Diagnosis not present

## 2014-05-30 DIAGNOSIS — N400 Enlarged prostate without lower urinary tract symptoms: Secondary | ICD-10-CM | POA: Diagnosis not present

## 2014-07-01 DIAGNOSIS — H35372 Puckering of macula, left eye: Secondary | ICD-10-CM | POA: Diagnosis not present

## 2014-07-01 DIAGNOSIS — H35352 Cystoid macular degeneration, left eye: Secondary | ICD-10-CM | POA: Diagnosis not present

## 2014-07-01 DIAGNOSIS — H532 Diplopia: Secondary | ICD-10-CM | POA: Diagnosis not present

## 2014-08-11 ENCOUNTER — Ambulatory Visit (HOSPITAL_COMMUNITY): Payer: Medicare Other | Attending: Cardiology | Admitting: Cardiology

## 2014-08-11 ENCOUNTER — Encounter: Payer: Self-pay | Admitting: Cardiology

## 2014-08-11 ENCOUNTER — Ambulatory Visit (INDEPENDENT_AMBULATORY_CARE_PROVIDER_SITE_OTHER): Payer: Medicare Other | Admitting: Cardiology

## 2014-08-11 VITALS — BP 126/78 | HR 56 | Ht 70.0 in | Wt 188.0 lb

## 2014-08-11 DIAGNOSIS — E785 Hyperlipidemia, unspecified: Secondary | ICD-10-CM

## 2014-08-11 DIAGNOSIS — I2510 Atherosclerotic heart disease of native coronary artery without angina pectoris: Secondary | ICD-10-CM | POA: Insufficient documentation

## 2014-08-11 DIAGNOSIS — R072 Precordial pain: Secondary | ICD-10-CM

## 2014-08-11 DIAGNOSIS — R413 Other amnesia: Secondary | ICD-10-CM | POA: Diagnosis not present

## 2014-08-11 DIAGNOSIS — I6523 Occlusion and stenosis of bilateral carotid arteries: Secondary | ICD-10-CM | POA: Diagnosis not present

## 2014-08-11 DIAGNOSIS — G454 Transient global amnesia: Secondary | ICD-10-CM | POA: Insufficient documentation

## 2014-08-11 DIAGNOSIS — I10 Essential (primary) hypertension: Secondary | ICD-10-CM | POA: Diagnosis not present

## 2014-08-11 LAB — HEPATIC FUNCTION PANEL
ALT: 25 U/L (ref 0–53)
AST: 32 U/L (ref 0–37)
Albumin: 4.1 g/dL (ref 3.5–5.2)
Alkaline Phosphatase: 55 U/L (ref 39–117)
Bilirubin, Direct: 0.1 mg/dL (ref 0.0–0.3)
Total Bilirubin: 0.6 mg/dL (ref 0.2–1.2)
Total Protein: 6.2 g/dL (ref 6.0–8.3)

## 2014-08-11 LAB — BASIC METABOLIC PANEL
BUN: 12 mg/dL (ref 6–23)
CO2: 28 mEq/L (ref 19–32)
Calcium: 9.4 mg/dL (ref 8.4–10.5)
Chloride: 106 mEq/L (ref 96–112)
Creatinine, Ser: 0.9 mg/dL (ref 0.4–1.5)
GFR: 87.55 mL/min (ref >60.00)
Glucose, Bld: 96 mg/dL (ref 70–99)
Potassium: 4.6 mEq/L (ref 3.5–5.1)
Sodium: 138 mEq/L (ref 135–145)

## 2014-08-11 MED ORDER — PRAVASTATIN SODIUM 40 MG PO TABS: 40.0000 mg | ORAL_TABLET | Freq: Every evening | ORAL | Status: DC

## 2014-08-11 NOTE — Patient Instructions (Addendum)
Your physician wants you to follow-up in:  12 months.  You will receive a reminder letter in the mail two months in advance. If you don't receive a letter, please call our office to schedule the follow-up appointment.   Your physician has requested that you have a carotid duplex. This test is an ultrasound of the carotid arteries in your neck. It looks at blood flow through these arteries that supply the brain with blood. Allow one hour for this exam. There are no restrictions or special instructions.   Your physician has recommended you make the following change in your medication--Stop Atorvastatin. Start Pravastatin 40 mg by mouth daily   Lab work to be done today--BMP, Liver profile

## 2014-08-11 NOTE — Progress Notes (Signed)
Carotid duplex performed 

## 2014-08-11 NOTE — Progress Notes (Signed)
Patient ID: [name], male   DOB: [birth year], 73 y.o.   MRN: [id number]    Patient Name: [name] Date of Encounter: 08/11/2014  Primary Care Provider:  [name], MD Primary Cardiologist:  Dorothy Spark  Problem List   Past Medical History  Diagnosis Date  . Acid reflux     a. well-controlled with ranitidine.  . High cholesterol   . BPH (benign prostatic hyperplasia)     a. s/p TURP 2012.  Marland Kitchen Hard of hearing   . Eye problem     a. L eye blurred vision - seeing eye doctor for tissue pulling away from eye.  . Hiatal hernia     a. Small-mod by CXR 2015.  Marland Kitchen Essential hypertension 09/10/2013   Past Surgical History  Procedure Laterality Date  . Urethral stricture surgery    . Transurethral resection of prostate    . Pars plana vitrectomy Left 2015    WITH MEMBRANE PEEL, 23 GAUGE   Allergies  Allergies  Allergen Reactions  . Sulfa Antibiotics Nausea Only and Rash  . Sulfamethoxazole Nausea Only and Rash  . Sulfasalazine Nausea Only and Rash   HPI  The patient seen in the ER on 09/05/2012 with chest pain after 30 minutes on the treadmill (please see consult note for full history). Coronary CT showed moderate non-obstructive CAD, discharged from the ED. the patient was started on statins and is coming for her six-month followup. In the meanwhile he underwent retinal surgery and is doing well with exercise restriction. He is still exercising just avoiding bending down and lifting weights. He is completely asymptomatic and denies any side effects of Lipitor is concerned about impaired memory with Lipitor.  08/11/14 - the patient is coming after 6 months , he states that he is physically very fit exercises for an hour twice a week without any problems in regards of chest pain or shortness of breath. He also denies palpitations, lower extremity edema or syncope. His most significant concern is memory loss for which she was seeing his primary care physician.  Home  Medications  Prior to Admission medications   Medication Sig Start Date End Date Taking? Authorizing Provider  aspirin 81 MG tablet Take 81 mg by mouth daily.   Yes Historical Provider, MD  Bromfenac Sodium 0.09 % SOLN Place 1 drop into the left eye 2 (two) times daily. 08/20/13  Yes Historical Provider, MD  Calcium Carbonate-Vitamin D (CALTRATE 600+D PO) Take 1 tablet by mouth daily.   Yes Historical Provider, MD  ketorolac (ACULAR) 0.5 % ophthalmic solution Place 1 drop into the left eye 2 (two) times daily. 07/07/13  Yes Historical Provider, MD  meclizine (ANTIVERT) 25 MG tablet Take 25 mg by mouth daily. 06/05/13  Yes Historical Provider, MD  Multiple Vitamins-Minerals (CENTRUM SILVER PO) Take 1 tablet by mouth daily.   Yes Historical Provider, MD  Phosphatidylserine-DHA-EPA (VAYACOG PO) Take 1 capsule by mouth daily.   Yes Historical Provider, MD  pravastatin (PRAVACHOL) 40 MG tablet Take 40 mg by mouth daily. 06/05/13  Yes Historical Provider, MD  ranitidine (ZANTAC) 150 MG capsule Take 150 mg by mouth 2 (two) times daily. 09/03/13  Yes Historical Provider, MD  rosuvastatin (CRESTOR) 20 MG tablet Take 1 tablet (20 mg total) by mouth daily. 09/05/13   Jarrett Soho Muthersbaugh, PA-C   Family History  Family History  Problem Relation Age of Onset  . Stroke Father     Died at 59  . Other Mother  Died at 8 - "old age"  . CAD Neg Hx     Social History  History   Social History  . Marital Status: Married    Spouse Name: N/A    Number of Children: N/A  . Years of Education: N/A   Occupational History  . Not on file.   Social History Main Topics  . Smoking status: Never Smoker   . Smokeless tobacco: Not on file  . Alcohol Use: Yes     Comment: Once per month  . Drug Use: No  . Sexual Activity: Not on file   Other Topics Concern  . Not on file   Social History Narrative     Review of Systems, as per HPI, otherwise negative General:  No chills, fever, night sweats or weight  changes.  Cardiovascular:  No chest pain, dyspnea on exertion, edema, orthopnea, palpitations, paroxysmal nocturnal dyspnea. Dermatological: No rash, lesions/masses Respiratory: No cough, dyspnea Urologic: No hematuria, dysuria Abdominal:   No nausea, vomiting, diarrhea, bright red blood per rectum, melena, or hematemesis Neurologic:  No visual changes, wkns, changes in mental status. All other systems reviewed and are otherwise negative except as noted above.  Physical Exam  Blood pressure 126/78, pulse 56, height 5\' 10"  (1.778 m), weight 188 lb (85.276 kg), SpO2 96 %.  General: Pleasant, NAD Psych: Normal affect. Neuro: Alert and oriented X 3. Moves all extremities spontaneously. HEENT: Normal  Neck: Supple without bruits or JVD. Lungs:  Resp regular and unlabored, CTA. Heart: RRR no s3, s4, or murmurs. Abdomen: Soft, non-tender, non-distended, BS + x 4.  Extremities: No clubbing, cyanosis or edema. DP/PT/Radials 2+ and equal bilaterally.  Labs:  No results for input(s): CKTOTAL, CKMB, TROPONINI in the last 72 hours. Lab Results  Component Value Date   WBC 7.2 09/05/2013   HGB 15.4 09/05/2013   HCT 44.2 09/05/2013   MCV 93.4 09/05/2013   PLT 249 09/05/2013   No results for input(s): NA, K, CL, CO2, BUN, CREATININE, CALCIUM, PROT, BILITOT, ALKPHOS, ALT, AST, GLUCOSE in the last 168 hours.  Invalid input(s): LABALBU Accessory Clinical Findings  Echocardiogram - none  ECG - SR, normal ECG   Assessment & Plan  1. Atypical chest pain - 63 younger appearing gentleman who has been very active, exercising daily without any limitations. He developed rather atypical left sided sharp chest pain today that started after 30 minutes on treadmill and resolved 15 minutes later. Patient's risk factors include hyperlipidemia (treated with statin). No family h/o CAD, no smoking, hypertension. He denies DM, however he had mildly elevated glucose in the past.  Cardiac CT showed moderate  diffuse non-obstructive CAD. Aggressive medical  Management was initiated, and pravastatin was switched to atorvastatin. The patient is doing great and he is completely asymptomatic however he has noticed more memory loss compared to prior.  We will switch back to pravastatin 40 mg daily we will check LFTs today.  He is encouraged to continue exercing, but advised to buy a HR monitor and not to exceed HR 125 BPM, no need for stress testing at this point.  2. HTN - controlled  3.  Memory loss -  We'll switch Lipitor to pravastatin 40 mg daily check today. We will also check ultrasound of the carotids bilaterally.  4.  Hyperlipidemia -  As above.  Follow up in 1 year.    Dorothy Spark, MD, [facility] 08/11/2014, 11:47 AM

## 2014-08-15 ENCOUNTER — Encounter: Payer: Self-pay | Admitting: *Deleted

## 2014-08-18 ENCOUNTER — Telehealth: Payer: Self-pay | Admitting: Cardiology

## 2014-08-18 DIAGNOSIS — H903 Sensorineural hearing loss, bilateral: Secondary | ICD-10-CM | POA: Diagnosis not present

## 2014-08-18 NOTE — Telephone Encounter (Signed)
New message        Pt returning ivy call

## 2014-08-18 NOTE — Telephone Encounter (Signed)
Informed the pt that per Dr [name] his carotid duplex was normal , with minimal plaque. Pt verbalized understanding

## 2014-09-10 DIAGNOSIS — H903 Sensorineural hearing loss, bilateral: Secondary | ICD-10-CM | POA: Diagnosis not present

## 2014-10-15 DIAGNOSIS — H5203 Hypermetropia, bilateral: Secondary | ICD-10-CM | POA: Diagnosis not present

## 2014-10-15 DIAGNOSIS — H2513 Age-related nuclear cataract, bilateral: Secondary | ICD-10-CM | POA: Diagnosis not present

## 2014-11-05 DIAGNOSIS — H2513 Age-related nuclear cataract, bilateral: Secondary | ICD-10-CM | POA: Diagnosis not present

## 2014-11-11 DIAGNOSIS — R310 Gross hematuria: Secondary | ICD-10-CM | POA: Diagnosis not present

## 2014-11-24 DIAGNOSIS — N400 Enlarged prostate without lower urinary tract symptoms: Secondary | ICD-10-CM | POA: Diagnosis not present

## 2014-11-24 DIAGNOSIS — R310 Gross hematuria: Secondary | ICD-10-CM | POA: Diagnosis not present

## 2014-12-10 DIAGNOSIS — R310 Gross hematuria: Secondary | ICD-10-CM | POA: Diagnosis not present

## 2015-01-20 ENCOUNTER — Emergency Department (HOSPITAL_COMMUNITY)
Admission: EM | Admit: 2015-01-20 | Discharge: 2015-01-20 | Disposition: A | Discharge status: Home / Self Care | Payer: Medicare Other | Attending: Emergency Medicine | Admitting: Emergency Medicine

## 2015-01-20 ENCOUNTER — Encounter (HOSPITAL_COMMUNITY): Payer: Self-pay | Admitting: Cardiology

## 2015-01-20 ENCOUNTER — Emergency Department (HOSPITAL_COMMUNITY): Payer: Medicare Other

## 2015-01-20 DIAGNOSIS — Z87438 Personal history of other diseases of male genital organs: Secondary | ICD-10-CM | POA: Diagnosis not present

## 2015-01-20 DIAGNOSIS — Z7982 Long term (current) use of aspirin: Secondary | ICD-10-CM | POA: Insufficient documentation

## 2015-01-20 DIAGNOSIS — Z79899 Other long term (current) drug therapy: Secondary | ICD-10-CM | POA: Diagnosis not present

## 2015-01-20 DIAGNOSIS — Z8669 Personal history of other diseases of the nervous system and sense organs: Secondary | ICD-10-CM | POA: Insufficient documentation

## 2015-01-20 DIAGNOSIS — K219 Gastro-esophageal reflux disease without esophagitis: Secondary | ICD-10-CM | POA: Diagnosis not present

## 2015-01-20 DIAGNOSIS — E780 Pure hypercholesterolemia: Secondary | ICD-10-CM | POA: Insufficient documentation

## 2015-01-20 DIAGNOSIS — I10 Essential (primary) hypertension: Secondary | ICD-10-CM | POA: Diagnosis not present

## 2015-01-20 DIAGNOSIS — R1031 Right lower quadrant pain: Secondary | ICD-10-CM | POA: Diagnosis not present

## 2015-01-20 LAB — URINALYSIS, ROUTINE W REFLEX MICROSCOPIC
Bilirubin Urine: NEGATIVE
GLUCOSE, UA: NEGATIVE mg/dL
Hgb urine dipstick: NEGATIVE
Ketones, ur: NEGATIVE mg/dL
Leukocytes, UA: NEGATIVE
Nitrite: NEGATIVE
PROTEIN: NEGATIVE mg/dL
SPECIFIC GRAVITY, URINE: 1.011 (ref 1.005–1.030)
Urobilinogen, UA: 0.2 mg/dL (ref 0.0–1.0)
pH: 7 (ref 5.0–8.0)

## 2015-01-20 LAB — COMPREHENSIVE METABOLIC PANEL
ALT: 24 U/L (ref 17–63)
AST: 28 U/L (ref 15–41)
Albumin: 3.9 g/dL (ref 3.5–5.0)
Alkaline Phosphatase: 48 U/L (ref 38–126)
Anion gap: 7 (ref 5–15)
BILIRUBIN TOTAL: 0.9 mg/dL (ref 0.3–1.2)
BUN: 9 mg/dL (ref 6–20)
CALCIUM: 9.1 mg/dL (ref 8.9–10.3)
CO2: 26 mmol/L (ref 22–32)
Chloride: 106 mmol/L (ref 101–111)
Creatinine, Ser: 0.98 mg/dL (ref 0.61–1.24)
GFR calc Af Amer: >60 mL/min (ref >60)
GFR calc non Af Amer: >60 mL/min (ref >60)
Glucose, Bld: 106 mg/dL — ABNORMAL HIGH (ref 65–99)
Potassium: 4.6 mmol/L (ref 3.5–5.1)
SODIUM: 139 mmol/L (ref 135–145)
TOTAL PROTEIN: 6.1 g/dL — AB (ref 6.5–8.1)

## 2015-01-20 LAB — CBC WITH DIFFERENTIAL/PLATELET
Basophils Absolute: 0.1 10*3/uL (ref 0.0–0.1)
Basophils Relative: 1 % (ref 0–1)
EOS PCT: 3 % (ref 0–5)
Eosinophils Absolute: 0.1 10*3/uL (ref 0.0–0.7)
HCT: 44.6 % (ref 39.0–52.0)
HEMOGLOBIN: 15 g/dL (ref 13.0–17.0)
Lymphocytes Relative: 18 % (ref 12–46)
Lymphs Abs: 1 10*3/uL (ref 0.7–4.0)
MCH: 31.4 pg (ref 26.0–34.0)
MCHC: 33.6 g/dL (ref 30.0–36.0)
MCV: 93.3 fL (ref 78.0–100.0)
MONOS PCT: 13 % — AB (ref 3–12)
Monocytes Absolute: 0.7 10*3/uL (ref 0.1–1.0)
Neutro Abs: 3.4 10*3/uL (ref 1.7–7.7)
Neutrophils Relative %: 65 % (ref 43–77)
PLATELETS: 219 10*3/uL (ref 150–400)
RBC: 4.78 MIL/uL (ref 4.22–5.81)
RDW: 13 % (ref 11.5–15.5)
WBC: 5.2 10*3/uL (ref 4.0–10.5)

## 2015-01-20 MED ORDER — IOHEXOL 300 MG/ML  SOLN
100.0000 mL | Freq: Once | INTRAMUSCULAR | Status: AC | PRN
  Administered 2015-01-20: 100 mL via INTRAVENOUS

## 2015-01-20 MED ORDER — IOHEXOL 300 MG/ML  SOLN
25.0000 mL | Freq: Once | INTRAMUSCULAR | Status: AC | PRN
  Administered 2015-01-20: 25 mL via ORAL

## 2015-01-20 MED ORDER — OXYCODONE-ACETAMINOPHEN 5-325 MG PO TABS: 1.0000 | ORAL_TABLET | Freq: Four times a day (QID) | ORAL | Status: DC | PRN

## 2015-01-20 MED ORDER — SODIUM CHLORIDE 0.9 % IV BOLUS (SEPSIS)
500.0000 mL | Freq: Once | INTRAVENOUS | Status: AC
Start: 2015-01-20 — End: 2015-01-20
  Administered 2015-01-20: 500 mL via INTRAVENOUS

## 2015-01-20 NOTE — Discharge Instructions (Signed)

## 2015-01-20 NOTE — ED Provider Notes (Signed)
CSN: [id number]     Arrival date & time 01/20/15  0865 History   First MD Initiated Contact with Patient 01/20/15 [phone]     Chief Complaint  Patient presents with  . Abdominal Pain     (Consider location/radiation/quality/duration/timing/severity/associated sxs/prior Treatment) Patient is a 74 y.o. male presenting with abdominal pain. The history is provided by the patient.  Abdominal Pain Pain location:  RLQ Pain quality: sharp   Pain radiates to:  Does not radiate Pain severity:  Moderate Onset quality:  Sudden Duration:  5 hours Timing:  Intermittent Progression:  Waxing and waning Chronicity:  New Context comment:  While sleeping Relieved by:  Nothing Worsened by:  Nothing tried Ineffective treatments:  None tried Associated symptoms: no chest pain, no cough, no diarrhea, no dysuria, no fever, no hematuria, no nausea, no shortness of breath and no vomiting     Past Medical History  Diagnosis Date  . Acid reflux     a. well-controlled with ranitidine.  . High cholesterol   . BPH (benign prostatic hyperplasia)     a. s/p TURP 2012.  Marland Kitchen Hard of hearing   . Eye problem     a. L eye blurred vision - seeing eye doctor for tissue pulling away from eye.  . Hiatal hernia     a. Small-mod by CXR 2015.  Marland Kitchen Essential hypertension 09/10/2013   Past Surgical History  Procedure Laterality Date  . Urethral stricture surgery    . Transurethral resection of prostate    . Pars plana vitrectomy Left 2015    WITH MEMBRANE PEEL, 23 GAUGE   Family History  Problem Relation Age of Onset  . Stroke Father     Died at 35  . Other Mother     Died at 6 - "old age"  . CAD Neg Hx    History  Substance Use Topics  . Smoking status: Never Smoker   . Smokeless tobacco: Not on file  . Alcohol Use: Yes     Comment: Once per month    Review of Systems  Constitutional: Negative for fever.  HENT: Negative for drooling and rhinorrhea.   Eyes: Negative for pain.  Respiratory: Negative  for cough and shortness of breath.   Cardiovascular: Negative for chest pain and leg swelling.  Gastrointestinal: Positive for abdominal pain. Negative for nausea, vomiting and diarrhea.  Genitourinary: Negative for dysuria and hematuria.  Musculoskeletal: Negative for gait problem and neck pain.  Skin: Negative for color change.  Neurological: Negative for numbness and headaches.  Hematological: Negative for adenopathy.  Psychiatric/Behavioral: Negative for behavioral problems.  All other systems reviewed and are negative.     Allergies  Sulfa antibiotics; Sulfamethoxazole; and Sulfasalazine  Home Medications   Prior to Admission medications   Medication Sig Start Date End Date Taking? Authorizing Provider  acetaminophen (TYLENOL) 500 MG tablet Take 1,000 mg by mouth every 6 (six) hours as needed for mild pain or moderate pain.   Yes Historical Provider, MD  aspirin 81 MG tablet Take 81 mg by mouth daily.   Yes Historical Provider, MD  Calcium Carbonate-Vitamin D (CALTRATE 600+D PO) Take 1 tablet by mouth daily.   Yes Historical Provider, MD  COENZYME Q-10 PO Take 1 tablet by mouth daily.   Yes Historical Provider, MD  meclizine (ANTIVERT) 25 MG tablet Take 25 mg by mouth daily. 06/05/13  Yes Historical Provider, MD  Multiple Vitamins-Minerals (CENTRUM SILVER PO) Take 1 tablet by mouth daily.   Yes Historical  Provider, MD  Polyethyl Glycol-Propyl Glycol (SYSTANE OP) Apply 2 drops to eye daily as needed (Dry eyes). 2 drops each eye   Yes Historical Provider, MD  pravastatin (PRAVACHOL) 40 MG tablet Take 1 tablet (40 mg total) by mouth every evening. 08/11/14  Yes Dorothy Spark, MD  ranitidine (ZANTAC) 150 MG capsule Take 150 mg by mouth 2 (two) times daily. 09/03/13  Yes Historical Provider, MD  VAYACOG 100-19.5-6.5 MG CAPS Take 1 capsule by mouth daily. 08/05/14  Yes Historical Provider, MD   BP 138/72 mmHg  Pulse 50  Temp(Src) 97.9 F (36.6 C) (Oral)  Resp 18  Ht 5\' 10"  (1.778  m)  Wt 180 lb (81.647 kg)  BMI 25.83 kg/m2  SpO2 96% Physical Exam  Constitutional: He is oriented to person, place, and time. He appears well-developed and well-nourished.  HENT:  Head: Normocephalic and atraumatic.  Right Ear: External ear normal.  Left Ear: External ear normal.  Nose: Nose normal.  Mouth/Throat: Oropharynx is clear and moist. No oropharyngeal exudate.  Eyes: Conjunctivae and EOM are normal. Pupils are equal, round, and reactive to light.  Neck: Normal range of motion. Neck supple.  Cardiovascular: Normal rate, regular rhythm, normal heart sounds and intact distal pulses.  Exam reveals no gallop and no friction rub.   No murmur heard. Pulmonary/Chest: Effort normal and breath sounds normal. No respiratory distress. He has no wheezes.  Abdominal: Soft. Bowel sounds are normal. He exhibits no distension. There is tenderness (mild ttp of RLQ). There is no rebound and no guarding.  Musculoskeletal: Normal range of motion. He exhibits no edema or tenderness.  Neurological: He is alert and oriented to person, place, and time.  Skin: Skin is warm and dry.  Psychiatric: He has a normal mood and affect. His behavior is normal.  Nursing note and vitals reviewed.   ED Course  Procedures (including critical care time) Labs Review Labs Reviewed  CBC WITH DIFFERENTIAL/PLATELET - Abnormal; Notable for the following:    Monocytes Relative 13 (*)    All other components within normal limits  COMPREHENSIVE METABOLIC PANEL - Abnormal; Notable for the following:    Glucose, Bld 106 (*)    Total Protein 6.1 (*)    All other components within normal limits  URINALYSIS, ROUTINE W REFLEX MICROSCOPIC    Imaging Review Ct Abdomen Pelvis W Contrast  01/20/2015   CLINICAL DATA:  Right lower quadrant pain for 1 day  EXAM: CT ABDOMEN AND PELVIS WITH CONTRAST  TECHNIQUE: Multidetector CT imaging of the abdomen and pelvis was performed using the standard protocol following bolus  administration of intravenous contrast.  CONTRAST:  11mL OMNIPAQUE IOHEXOL 300 MG/ML  SOLN  COMPARISON:  11/24/2014  FINDINGS: Lung bases are free of acute infiltrate or sizable effusion. The previously seen nodular density in the right upper lobe is stable. A moderate-sized hiatal hernia is noted. It is stable in appearance from the prior exam.  The liver, gallbladder, spleen, adrenal glands and pancreas are within normal limits. The kidneys are well visualized bilaterally show no evidence of renal calculi or obstructive changes. A few tiny cysts are noted within the kidneys bilaterally.  Aortoiliac calcifications are noted. The appendix is not well visualized although no inflammatory changes to suggest appendicitis are seen. The bladder is partially distended. Changes of enlarged prostate are again seen with a TURP defect stable in appearance. No pelvic mass lesion or sidewall abnormality is noted. Bony structures show degenerative change of the lumbar spine. No compression  deformities are noted.  IMPRESSION: Stable right upper lobe nodule. In retrospect this is been stable since January of 2015 consistent with a benign etiology. No further followup is necessary.  Nonvisualization of the appendix although no inflammatory changes to suggest appendicitis are seen.  Chronic changes as described above.   Electronically Signed   By: Inez Catalina M.D.   On: 01/20/2015 11:52     EKG Interpretation None      MDM   Final diagnoses:  RLQ abdominal pain    9:30 AM 74 y.o. male who presents with sharp right lower quadrant pain which began this morning at 4 AM. He's been intermittent since that time. He denies any nausea, vomiting, fevers, or diarrhea. Vital signs unremarkable here. We'll get screening labs and imaging. The patient declines pain medicine at this time.  12:56 PM: I interpreted/reviewed the labs and/or imaging which were non-contributory.  Pt denies any testicular or genital pain. No GU sx.   Continues to appear well on exam. He has not required any pain medicine here. I have discussed the diagnosis/risks/treatment options with the patient and believe the pt to be eligible for discharge home to follow-up with his pcp in 1-2 days for repeat eval. We also discussed returning to the ED immediately if new or worsening sx occur. We discussed the sx which are most concerning (e.g., worsening pain, fever, vomiting) that necessitate immediate return. Medications administered to the patient during their visit and any new prescriptions provided to the patient are listed below.  Medications given during this visit Medications  sodium chloride 0.9 % bolus 500 mL (0 mLs Intravenous Stopped 01/20/15 1048)  iohexol (OMNIPAQUE) 300 MG/ML solution 25 mL (25 mLs Oral Contrast Given 01/20/15 0950)  iohexol (OMNIPAQUE) 300 MG/ML solution 100 mL (100 mLs Intravenous Contrast Given 01/20/15 1116)    New Prescriptions   OXYCODONE-ACETAMINOPHEN (PERCOCET) 5-325 MG PER TABLET    Take 1-2 tablets by mouth every 6 (six) hours as needed for moderate pain.      Pamella Pert, MD 01/20/15 1256

## 2015-01-20 NOTE — ED Notes (Signed)
Pt reports sharp right lower quad pain that started this morning. Denies any urinary or bowel changes. No n/v. Denies any hx of kidney stones.

## 2015-02-19 DIAGNOSIS — H2513 Age-related nuclear cataract, bilateral: Secondary | ICD-10-CM | POA: Diagnosis not present

## 2015-02-19 DIAGNOSIS — H35372 Puckering of macula, left eye: Secondary | ICD-10-CM | POA: Diagnosis not present

## 2015-03-04 DIAGNOSIS — H2513 Age-related nuclear cataract, bilateral: Secondary | ICD-10-CM | POA: Diagnosis not present

## 2015-03-04 DIAGNOSIS — H35372 Puckering of macula, left eye: Secondary | ICD-10-CM | POA: Diagnosis not present

## 2015-03-16 DIAGNOSIS — Z125 Encounter for screening for malignant neoplasm of prostate: Secondary | ICD-10-CM | POA: Diagnosis not present

## 2015-03-16 DIAGNOSIS — E785 Hyperlipidemia, unspecified: Secondary | ICD-10-CM | POA: Diagnosis not present

## 2015-03-16 DIAGNOSIS — I10 Essential (primary) hypertension: Secondary | ICD-10-CM | POA: Diagnosis not present

## 2015-03-23 DIAGNOSIS — E785 Hyperlipidemia, unspecified: Secondary | ICD-10-CM | POA: Diagnosis not present

## 2015-03-23 DIAGNOSIS — Z1389 Encounter for screening for other disorder: Secondary | ICD-10-CM | POA: Diagnosis not present

## 2015-03-23 DIAGNOSIS — I10 Essential (primary) hypertension: Secondary | ICD-10-CM | POA: Diagnosis not present

## 2015-03-23 DIAGNOSIS — Z6827 Body mass index (BMI) 27.0-27.9, adult: Secondary | ICD-10-CM | POA: Diagnosis not present

## 2015-03-23 DIAGNOSIS — G4733 Obstructive sleep apnea (adult) (pediatric): Secondary | ICD-10-CM | POA: Diagnosis not present

## 2015-03-23 DIAGNOSIS — Z23 Encounter for immunization: Secondary | ICD-10-CM | POA: Diagnosis not present

## 2015-03-23 DIAGNOSIS — Z0000 Encounter for general adult medical examination without abnormal findings: Secondary | ICD-10-CM | POA: Diagnosis not present

## 2015-03-23 DIAGNOSIS — K219 Gastro-esophageal reflux disease without esophagitis: Secondary | ICD-10-CM | POA: Diagnosis not present

## 2015-03-23 DIAGNOSIS — F0390 Unspecified dementia without behavioral disturbance: Secondary | ICD-10-CM | POA: Diagnosis not present

## 2015-03-23 DIAGNOSIS — M545 Low back pain: Secondary | ICD-10-CM | POA: Diagnosis not present

## 2015-03-24 DIAGNOSIS — Z1212 Encounter for screening for malignant neoplasm of rectum: Secondary | ICD-10-CM | POA: Diagnosis not present

## 2015-04-15 DIAGNOSIS — H2513 Age-related nuclear cataract, bilateral: Secondary | ICD-10-CM | POA: Diagnosis not present

## 2015-04-23 DIAGNOSIS — K219 Gastro-esophageal reflux disease without esophagitis: Secondary | ICD-10-CM | POA: Diagnosis not present

## 2015-04-23 DIAGNOSIS — Z888 Allergy status to other drugs, medicaments and biological substances status: Secondary | ICD-10-CM | POA: Diagnosis not present

## 2015-04-23 DIAGNOSIS — G4733 Obstructive sleep apnea (adult) (pediatric): Secondary | ICD-10-CM | POA: Diagnosis not present

## 2015-04-23 DIAGNOSIS — H2513 Age-related nuclear cataract, bilateral: Secondary | ICD-10-CM | POA: Diagnosis not present

## 2015-04-23 DIAGNOSIS — Z882 Allergy status to sulfonamides status: Secondary | ICD-10-CM | POA: Diagnosis not present

## 2015-04-23 DIAGNOSIS — Z7982 Long term (current) use of aspirin: Secondary | ICD-10-CM | POA: Diagnosis not present

## 2015-04-23 DIAGNOSIS — Z79899 Other long term (current) drug therapy: Secondary | ICD-10-CM | POA: Diagnosis not present

## 2015-04-23 DIAGNOSIS — H52202 Unspecified astigmatism, left eye: Secondary | ICD-10-CM | POA: Diagnosis not present

## 2015-04-23 DIAGNOSIS — F0390 Unspecified dementia without behavioral disturbance: Secondary | ICD-10-CM | POA: Diagnosis not present

## 2015-04-23 DIAGNOSIS — I2510 Atherosclerotic heart disease of native coronary artery without angina pectoris: Secondary | ICD-10-CM | POA: Diagnosis not present

## 2015-04-23 DIAGNOSIS — E780 Pure hypercholesterolemia: Secondary | ICD-10-CM | POA: Diagnosis not present

## 2015-04-23 DIAGNOSIS — H25812 Combined forms of age-related cataract, left eye: Secondary | ICD-10-CM | POA: Diagnosis not present

## 2015-04-24 DIAGNOSIS — Z961 Presence of intraocular lens: Secondary | ICD-10-CM | POA: Insufficient documentation

## 2015-05-05 DIAGNOSIS — D2372 Other benign neoplasm of skin of left lower limb, including hip: Secondary | ICD-10-CM | POA: Diagnosis not present

## 2015-05-05 DIAGNOSIS — L821 Other seborrheic keratosis: Secondary | ICD-10-CM | POA: Diagnosis not present

## 2015-05-05 DIAGNOSIS — D2239 Melanocytic nevi of other parts of face: Secondary | ICD-10-CM | POA: Diagnosis not present

## 2015-05-05 DIAGNOSIS — D1801 Hemangioma of skin and subcutaneous tissue: Secondary | ICD-10-CM | POA: Diagnosis not present

## 2015-05-05 DIAGNOSIS — L570 Actinic keratosis: Secondary | ICD-10-CM | POA: Diagnosis not present

## 2015-05-16 DIAGNOSIS — Z23 Encounter for immunization: Secondary | ICD-10-CM | POA: Diagnosis not present

## 2015-05-29 DIAGNOSIS — K219 Gastro-esophageal reflux disease without esophagitis: Secondary | ICD-10-CM | POA: Diagnosis not present

## 2015-05-29 DIAGNOSIS — Z961 Presence of intraocular lens: Secondary | ICD-10-CM | POA: Diagnosis not present

## 2015-05-29 DIAGNOSIS — Z79899 Other long term (current) drug therapy: Secondary | ICD-10-CM | POA: Diagnosis not present

## 2015-05-29 DIAGNOSIS — Z48810 Encounter for surgical aftercare following surgery on the sense organs: Secondary | ICD-10-CM | POA: Diagnosis not present

## 2015-05-29 DIAGNOSIS — H2511 Age-related nuclear cataract, right eye: Secondary | ICD-10-CM | POA: Diagnosis not present

## 2015-05-29 DIAGNOSIS — Z882 Allergy status to sulfonamides status: Secondary | ICD-10-CM | POA: Diagnosis not present

## 2015-05-29 DIAGNOSIS — Z7982 Long term (current) use of aspirin: Secondary | ICD-10-CM | POA: Diagnosis not present

## 2015-05-29 DIAGNOSIS — F0390 Unspecified dementia without behavioral disturbance: Secondary | ICD-10-CM | POA: Diagnosis not present

## 2015-05-29 DIAGNOSIS — Z9889 Other specified postprocedural states: Secondary | ICD-10-CM | POA: Diagnosis not present

## 2015-05-29 DIAGNOSIS — G4733 Obstructive sleep apnea (adult) (pediatric): Secondary | ICD-10-CM | POA: Diagnosis not present

## 2015-06-01 DIAGNOSIS — R972 Elevated prostate specific antigen [PSA]: Secondary | ICD-10-CM | POA: Diagnosis not present

## 2015-06-01 DIAGNOSIS — R310 Gross hematuria: Secondary | ICD-10-CM | POA: Diagnosis not present

## 2015-06-01 DIAGNOSIS — N400 Enlarged prostate without lower urinary tract symptoms: Secondary | ICD-10-CM | POA: Diagnosis not present

## 2015-06-04 DIAGNOSIS — H2511 Age-related nuclear cataract, right eye: Secondary | ICD-10-CM | POA: Diagnosis not present

## 2015-06-04 DIAGNOSIS — Z882 Allergy status to sulfonamides status: Secondary | ICD-10-CM | POA: Diagnosis not present

## 2015-06-04 DIAGNOSIS — Z961 Presence of intraocular lens: Secondary | ICD-10-CM | POA: Diagnosis not present

## 2015-06-04 DIAGNOSIS — H25811 Combined forms of age-related cataract, right eye: Secondary | ICD-10-CM | POA: Diagnosis not present

## 2015-06-04 DIAGNOSIS — G4733 Obstructive sleep apnea (adult) (pediatric): Secondary | ICD-10-CM | POA: Diagnosis not present

## 2015-06-04 DIAGNOSIS — K219 Gastro-esophageal reflux disease without esophagitis: Secondary | ICD-10-CM | POA: Diagnosis not present

## 2015-06-04 DIAGNOSIS — I2510 Atherosclerotic heart disease of native coronary artery without angina pectoris: Secondary | ICD-10-CM | POA: Diagnosis not present

## 2015-06-04 DIAGNOSIS — F0390 Unspecified dementia without behavioral disturbance: Secondary | ICD-10-CM | POA: Diagnosis not present

## 2015-06-05 DIAGNOSIS — Z9841 Cataract extraction status, right eye: Secondary | ICD-10-CM | POA: Diagnosis not present

## 2015-06-05 DIAGNOSIS — K219 Gastro-esophageal reflux disease without esophagitis: Secondary | ICD-10-CM | POA: Diagnosis not present

## 2015-06-05 DIAGNOSIS — E7800 Pure hypercholesterolemia, unspecified: Secondary | ICD-10-CM | POA: Diagnosis not present

## 2015-06-05 DIAGNOSIS — Z48810 Encounter for surgical aftercare following surgery on the sense organs: Secondary | ICD-10-CM | POA: Diagnosis not present

## 2015-06-05 DIAGNOSIS — Z9842 Cataract extraction status, left eye: Secondary | ICD-10-CM | POA: Diagnosis not present

## 2015-06-05 DIAGNOSIS — Z79899 Other long term (current) drug therapy: Secondary | ICD-10-CM | POA: Diagnosis not present

## 2015-06-05 DIAGNOSIS — Z961 Presence of intraocular lens: Secondary | ICD-10-CM | POA: Diagnosis not present

## 2015-06-05 DIAGNOSIS — Z7982 Long term (current) use of aspirin: Secondary | ICD-10-CM | POA: Diagnosis not present

## 2015-06-05 DIAGNOSIS — H35373 Puckering of macula, bilateral: Secondary | ICD-10-CM | POA: Diagnosis not present

## 2015-06-05 DIAGNOSIS — H4389 Other disorders of vitreous body: Secondary | ICD-10-CM | POA: Diagnosis not present

## 2015-07-07 ENCOUNTER — Encounter: Payer: Self-pay | Admitting: Cardiology

## 2015-07-07 ENCOUNTER — Ambulatory Visit (INDEPENDENT_AMBULATORY_CARE_PROVIDER_SITE_OTHER): Payer: Medicare Other | Admitting: Cardiology

## 2015-07-07 VITALS — BP 128/68 | HR 53 | Ht 70.0 in | Wt 193.2 lb

## 2015-07-07 DIAGNOSIS — R413 Other amnesia: Secondary | ICD-10-CM | POA: Diagnosis not present

## 2015-07-07 DIAGNOSIS — I2510 Atherosclerotic heart disease of native coronary artery without angina pectoris: Secondary | ICD-10-CM | POA: Diagnosis not present

## 2015-07-07 DIAGNOSIS — E785 Hyperlipidemia, unspecified: Secondary | ICD-10-CM | POA: Diagnosis not present

## 2015-07-07 DIAGNOSIS — I10 Essential (primary) hypertension: Secondary | ICD-10-CM | POA: Diagnosis not present

## 2015-07-07 MED ORDER — PRAVASTATIN SODIUM 20 MG PO TABS: 20.0000 mg | ORAL_TABLET | Freq: Every evening | ORAL | Status: DC

## 2015-07-07 NOTE — Patient Instructions (Signed)
Medication Instructions:   DECREASE YOUR PRAVASTATIN TO 20 MG ONCE DAILY--TAKE THIS IN THE EVENINGS.        Follow-Up:   Your physician wants you to follow-up in: [place] will receive a reminder letter in the mail two months in advance. If you don't receive a letter, please call our office to schedule the follow-up appointment.        If you need a refill on your cardiac medications before your next appointment, please call your pharmacy.

## 2015-07-07 NOTE — Progress Notes (Signed)
Patient ID: [name], male   DOB: 18-Jan-1941, 73 y.o.   MRN: [id number]    Patient Name: TENNIS Beasley Date of Encounter: 07/07/2015  Primary Care Provider:  [name], MD Primary Cardiologist:  Dorothy Spark  Problem List   Past Medical History  Diagnosis Date  . Acid reflux     a. well-controlled with ranitidine.  . High cholesterol   . BPH (benign prostatic hyperplasia)     a. s/p TURP 2012.  Marland Kitchen Hard of hearing   . Eye problem     a. L eye blurred vision - seeing eye doctor for tissue pulling away from eye.  . Hiatal hernia     a. Small-mod by CXR 2015.  Marland Kitchen Essential hypertension 09/10/2013   Past Surgical History  Procedure Laterality Date  . Urethral stricture surgery    . Transurethral resection of prostate    . Pars plana vitrectomy Left 2015    WITH MEMBRANE PEEL, 23 GAUGE   Allergies  Allergies  Allergen Reactions  . Sulfa Antibiotics Nausea Only and Rash  . Sulfamethoxazole Nausea Only and Rash  . Sulfasalazine Nausea Only and Rash   HPI  The patient seen in the ER on 09/05/2012 with chest pain after 30 minutes on the treadmill (please see consult note for full history). Coronary CT showed moderate non-obstructive CAD, discharged from the ED. the patient was started on statins and is coming for her six-month followup. In the meanwhile he underwent retinal surgery and is doing well with exercise restriction. He is still exercising just avoiding bending down and lifting weights. He is completely asymptomatic and denies any side effects of Lipitor is concerned about impaired memory with Lipitor.  07/07/2015 - the patient is coming after 1 year, he is doing great, going to the gym twice a week and exercising with a personal trainer, he has no CP, DOE, no palpitation, claudications or syncope. HR gets up max to 94/minute.  His only concern is worsening forgetfulness, his PCP prescribed Namenda that helps his slightly. He is complaint with his meds.   Home  Medications  Prior to Admission medications   Medication Sig Start Date End Date Taking? Authorizing Provider  aspirin 81 MG tablet Take 81 mg by mouth daily.   Yes Historical Provider, MD  Bromfenac Sodium 0.09 % SOLN Place 1 drop into the left eye 2 (two) times daily. 08/20/13  Yes Historical Provider, MD  Calcium Carbonate-Vitamin D (CALTRATE 600+D PO) Take 1 tablet by mouth daily.   Yes Historical Provider, MD  ketorolac (ACULAR) 0.5 % ophthalmic solution Place 1 drop into the left eye 2 (two) times daily. 07/07/13  Yes Historical Provider, MD  meclizine (ANTIVERT) 25 MG tablet Take 25 mg by mouth daily. 06/05/13  Yes Historical Provider, MD  Multiple Vitamins-Minerals (CENTRUM SILVER PO) Take 1 tablet by mouth daily.   Yes Historical Provider, MD  Phosphatidylserine-DHA-EPA (VAYACOG PO) Take 1 capsule by mouth daily.   Yes Historical Provider, MD  pravastatin (PRAVACHOL) 40 MG tablet Take 40 mg by mouth daily. 06/05/13  Yes Historical Provider, MD  ranitidine (ZANTAC) 150 MG capsule Take 150 mg by mouth 2 (two) times daily. 09/03/13  Yes Historical Provider, MD  rosuvastatin (CRESTOR) 20 MG tablet Take 1 tablet (20 mg total) by mouth daily. 09/05/13   Jarrett Soho Muthersbaugh, PA-C   Family History  Family History  Problem Relation Age of Onset  . Stroke Father     Died at 70  .  Other Mother     Died at 50 - "old age"  . CAD Neg Hx     Social History  Social History   Social History  . Marital Status: Married    Spouse Name: N/A  . Number of Children: N/A  . Years of Education: N/A   Occupational History  . Not on file.   Social History Main Topics  . Smoking status: Never Smoker   . Smokeless tobacco: Not on file  . Alcohol Use: Yes     Comment: Once per month  . Drug Use: No  . Sexual Activity: Not on file   Other Topics Concern  . Not on file   Social History Narrative     Review of Systems, as per HPI, otherwise negative General:  No chills, fever, night sweats or  weight changes.  Cardiovascular:  No chest pain, dyspnea on exertion, edema, orthopnea, palpitations, paroxysmal nocturnal dyspnea. Dermatological: No rash, lesions/masses Respiratory: No cough, dyspnea Urologic: No hematuria, dysuria Abdominal:   No nausea, vomiting, diarrhea, bright red blood per rectum, melena, or hematemesis Neurologic:  No visual changes, wkns, changes in mental status. All other systems reviewed and are otherwise negative except as noted above.  Physical Exam  Blood pressure 128/68, pulse 53, height 5\' 10"  (1.778 m), weight 193 lb 3.2 oz (87.635 kg).  General: Pleasant, NAD Psych: Normal affect. Neuro: Alert and oriented X 3. Moves all extremities spontaneously. HEENT: Normal  Neck: Supple without bruits or JVD. Lungs:  Resp regular and unlabored, CTA. Heart: RRR no s3, s4, or murmurs. Abdomen: Soft, non-tender, non-distended, BS + x 4.  Extremities: No clubbing, cyanosis or edema. DP/PT/Radials 2+ and equal bilaterally.  Labs:  No results for input(s): CKTOTAL, CKMB, TROPONINI in the last 72 hours. Lab Results  Component Value Date   WBC 5.2 01/20/2015   HGB 15.0 01/20/2015   HCT 44.6 01/20/2015   MCV 93.3 01/20/2015   PLT 219 01/20/2015   No results for input(s): NA, K, CL, CO2, BUN, CREATININE, CALCIUM, PROT, BILITOT, ALKPHOS, ALT, AST, GLUCOSE in the last 168 hours.  Invalid input(s): LABALBU Accessory Clinical Findings  Echocardiogram - none  ECG - SR, normal ECG    Assessment & Plan  1. Atypical chest pain - 84 younger appearing gentleman who has been very active, exercising daily without any limitations. He developed rather atypical left sided sharp chest pain today that started after 30 minutes on treadmill and resolved 15 minutes later. Patient's risk factors include hyperlipidemia (treated with statin). No family h/o CAD, no smoking, hypertension. He denies DM, however he had mildly elevated glucose in the past.  Cardiac CT showed  moderate diffuse non-obstructive CAD. Aggressive medical management was initiated, however with atorvastatin his memory loss worsened, so we switched back to high dose pravastatin. His memory continues to deteriorate so I will decrease to 20 mg po daily. He is encouraged to continue exercing, but advised to buy a HR monitor and not to exceed HR 110 BPM, no need for stress testing at this point.  2. HTN - controlled  3.  Memory loss -  Decrease pravastatin to 20 mg po daily, ultrasound of the carotids bilaterally showed minimal plaque in 2015.  4.  Hyperlipidemia -  As above.  Follow up in 1 year.    Dorothy Spark, MD, [facility] 07/07/2015, 10:48 AM

## 2015-07-16 DIAGNOSIS — G4733 Obstructive sleep apnea (adult) (pediatric): Secondary | ICD-10-CM | POA: Diagnosis not present

## 2015-07-16 DIAGNOSIS — Z6827 Body mass index (BMI) 27.0-27.9, adult: Secondary | ICD-10-CM | POA: Diagnosis not present

## 2015-07-16 DIAGNOSIS — I10 Essential (primary) hypertension: Secondary | ICD-10-CM | POA: Diagnosis not present

## 2015-07-16 DIAGNOSIS — F0390 Unspecified dementia without behavioral disturbance: Secondary | ICD-10-CM | POA: Diagnosis not present

## 2015-07-16 DIAGNOSIS — E785 Hyperlipidemia, unspecified: Secondary | ICD-10-CM | POA: Diagnosis not present

## 2015-07-21 IMAGING — CR DG CHEST 2V
2 series · 2 of 2 positions shown · non-contrast
Comparison: DG CHEST 2 VIEW dated 11/19/2010

CLINICAL DATA: Left-sided chest pain while exercising.

EXAM:
CHEST  2 VIEW

[w chest pa]
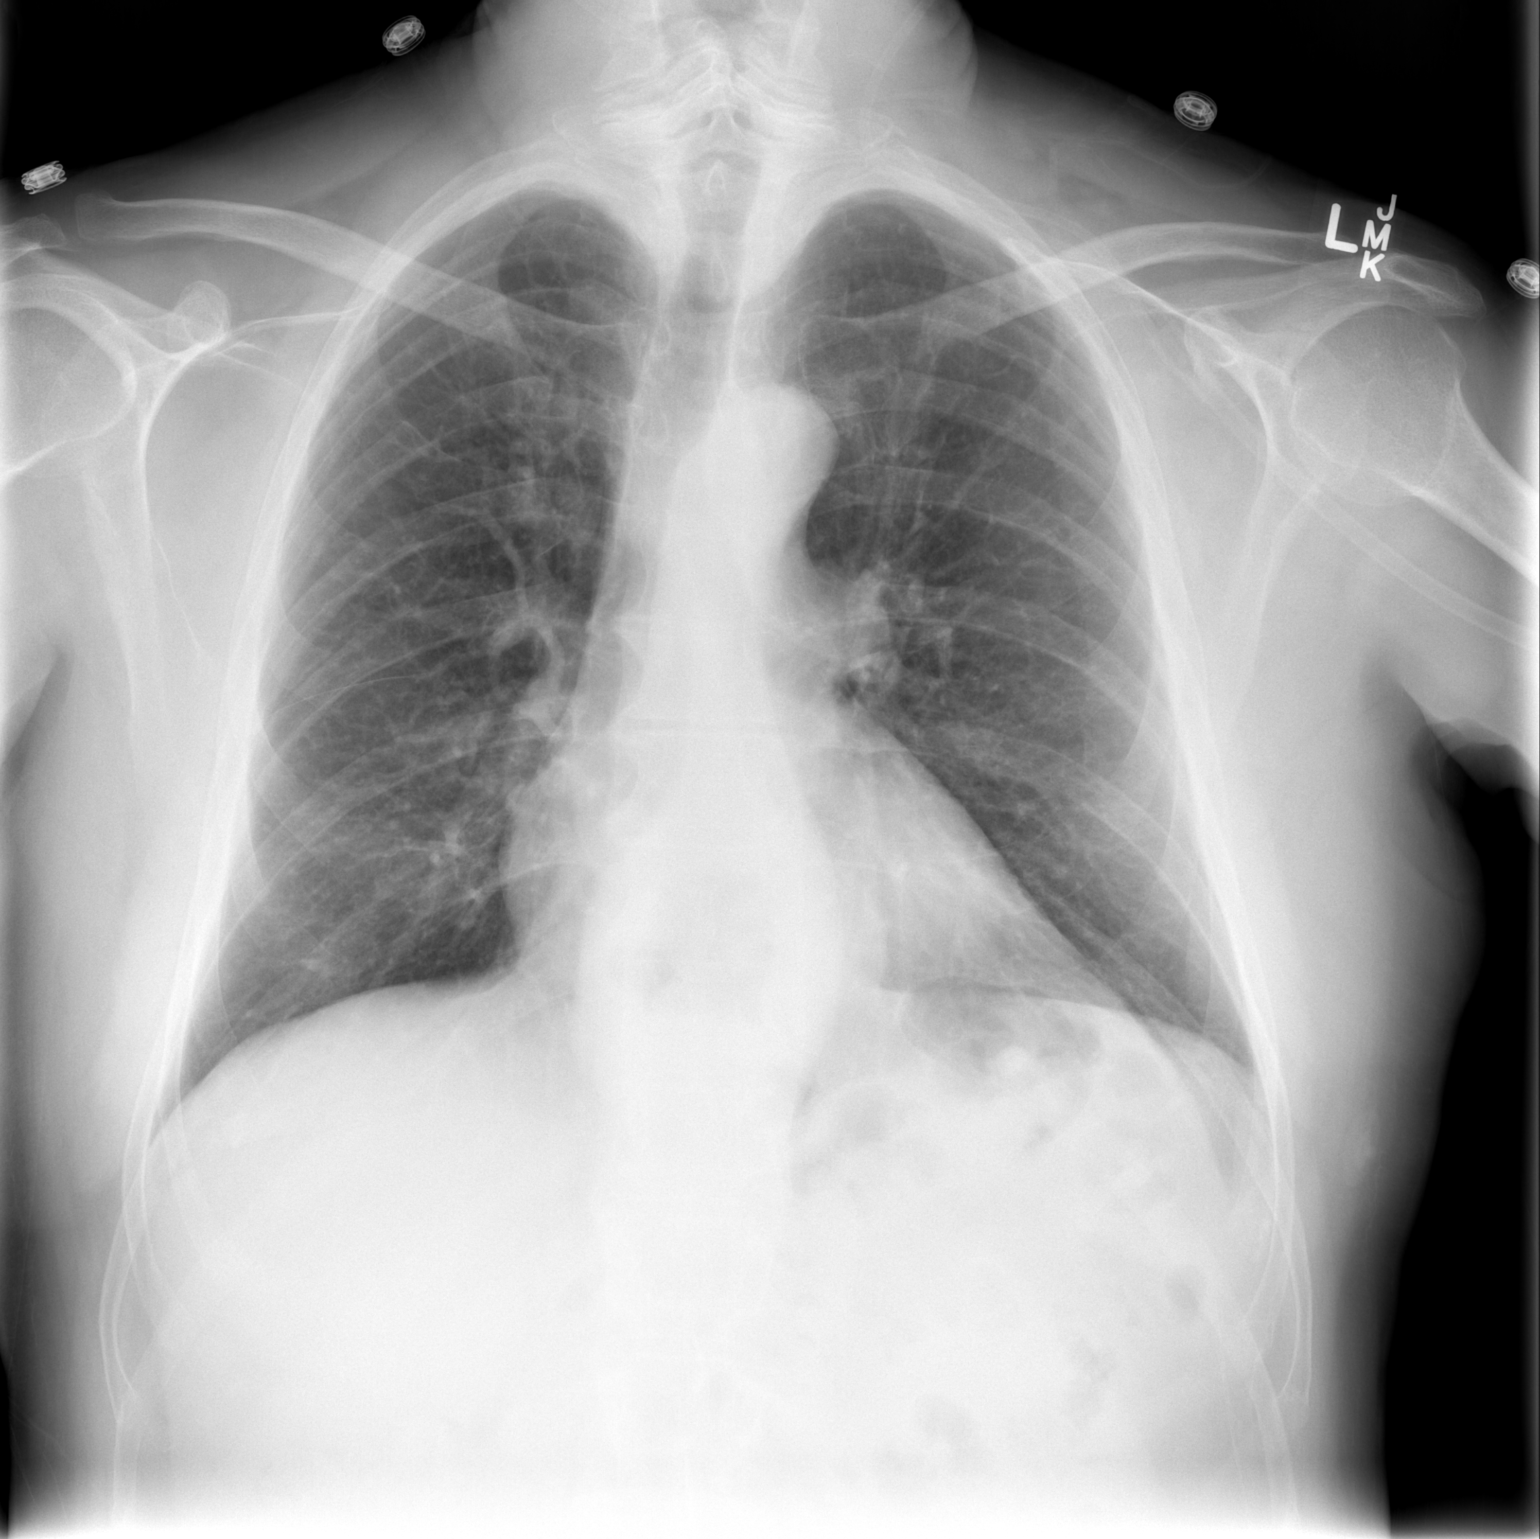

[w chest lat]
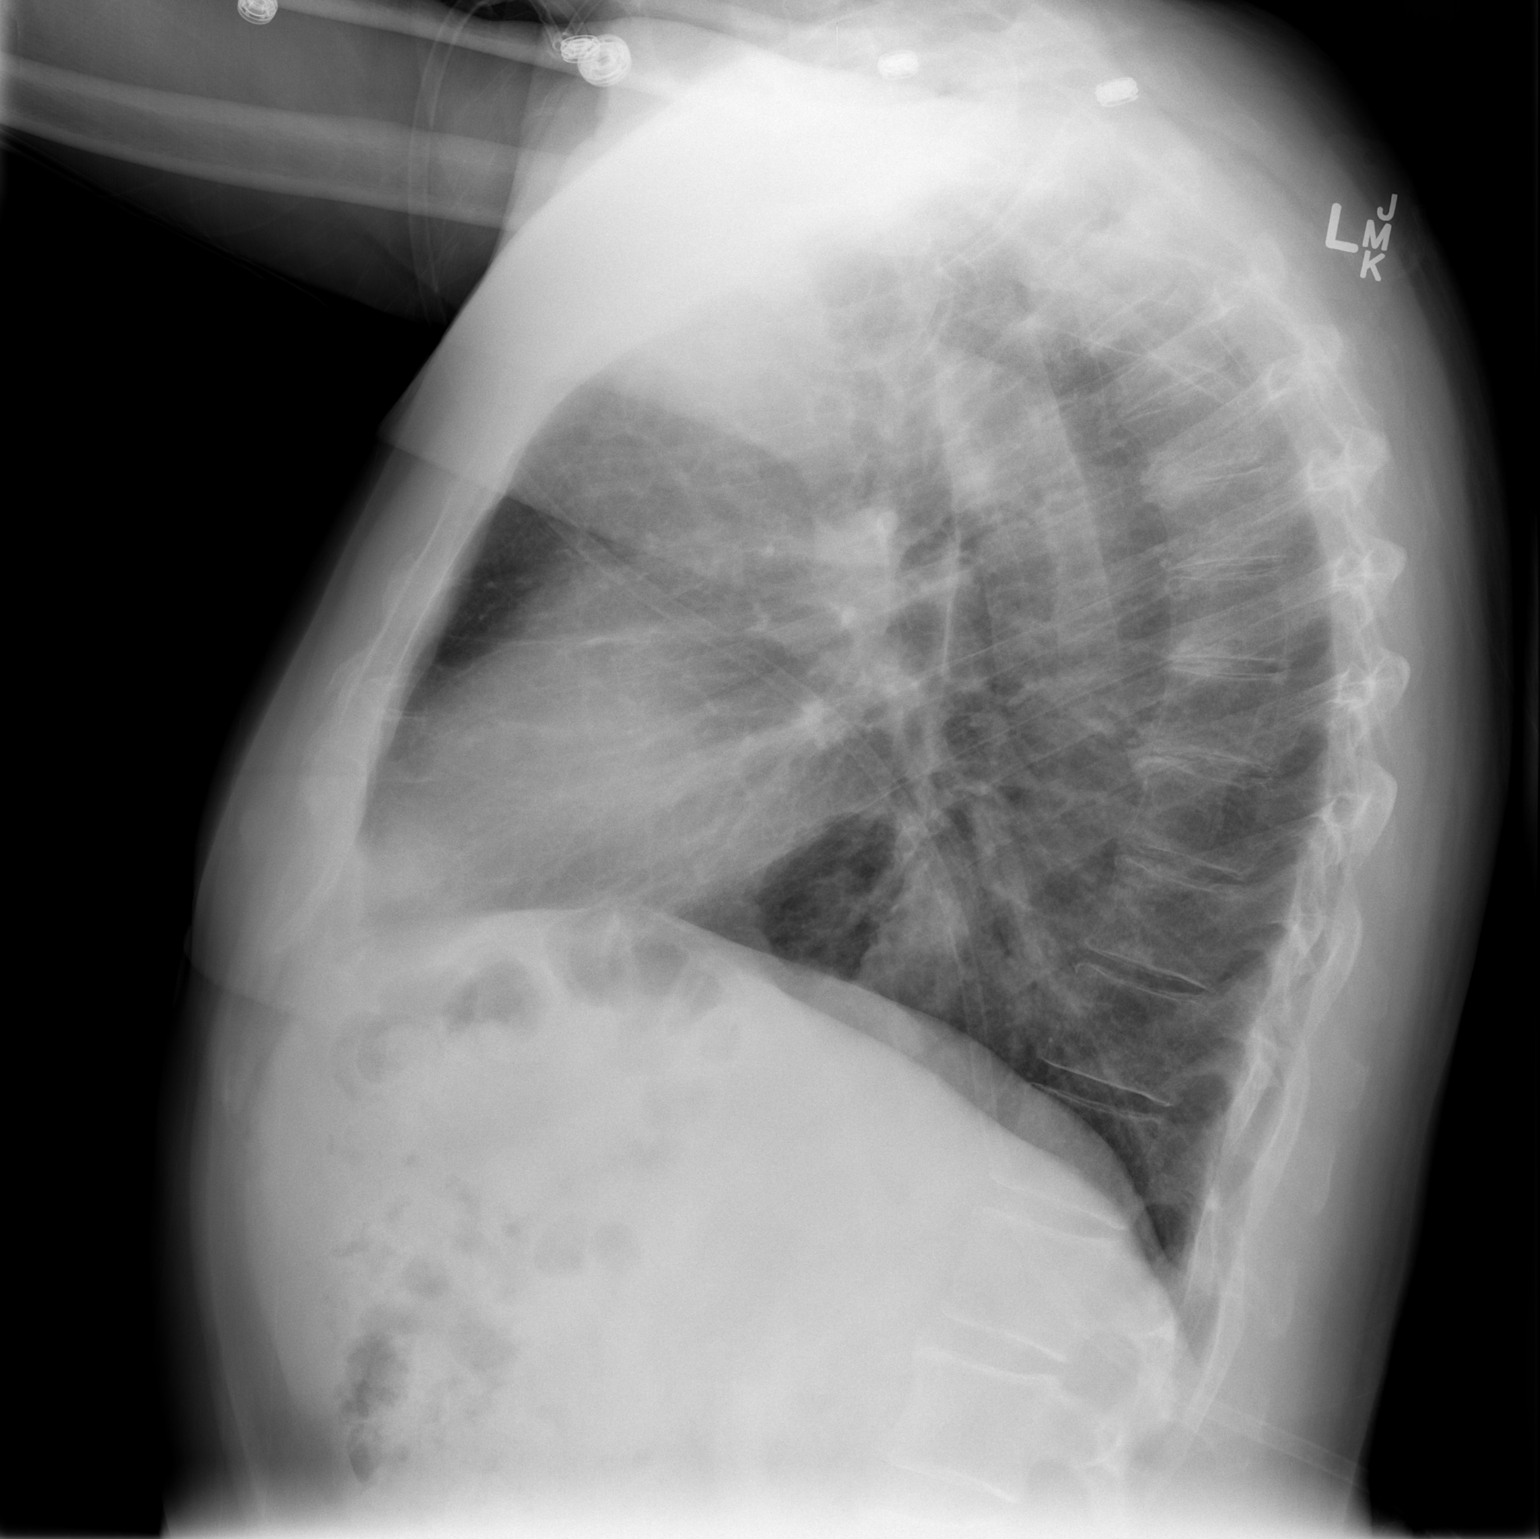

[2 of 2 positions shown; findings below may reference images not displayed]

FINDINGS: Lateral view degraded by patient arm position. Midline trachea.
Normal heart size with a small the moderate hiatal hernia. No
pleural effusion or pneumothorax. Clear lungs.
IMPRESSION: Small to moderate hiatal hernia.

No acute cardiopulmonary disease.

## 2015-07-21 IMAGING — CT CT HEART MORP W/ CTA COR W/ SCORE W/ CA W/CM &/OR W/O CM
1 of 12 series · 3 of 20 positions shown, 4 images · non-contrast
Comparison: None.

CLINICAL DATA: Chest pain

EXAM:
Cardiac CT
TECHNIQUE: The patient was scanned on a Philips 256 scanner. A 100 kV
prospective scan was triggered in the descending thoracic aorta at
111 HU's. Gantry rotation speed was 270 msecs and collimation was .9
mm. No beta blockade and 0.4 mg sl nitro was given. The 3D data set
was reconstructed in 5% intervals of the 67-82 of the cardiac cycle.
The patient received 80 cc of contrast.

[Series 17: w/o ec, 80.0% · axial · non-contrast · 0.39mm/px · z∈[-239,-92]mm · 3 of 368 slices shown, 4 images]
[im 1/368  vessel]
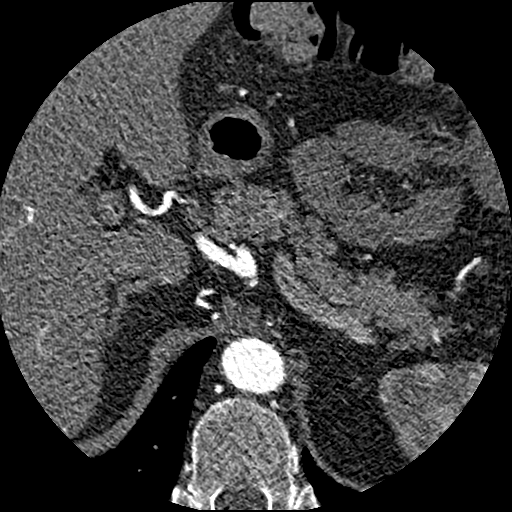
[im 1/368  lung]
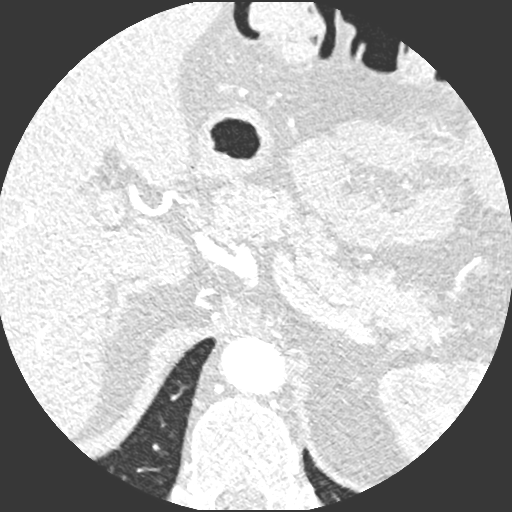
[im 184/368  vessel]
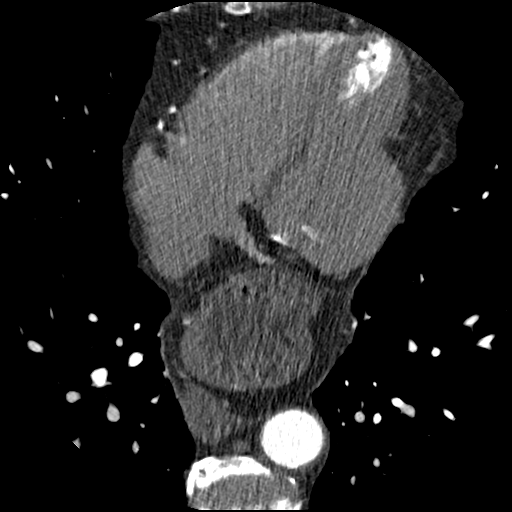
[im 368/368  vessel]
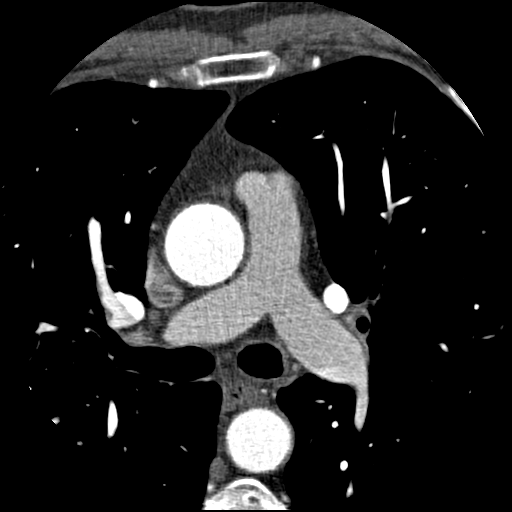

[3 of 20 positions shown; findings below may reference images not displayed]

FINDINGS: Coronary Arteries:  Originating in the normal position.

Left main has only minimal atherosclerotic plague. LM gives rise to
LAD, small ramus intermedius and non-dominant LCX.

Ostial LAD has a mild mixed plague with 0-25% stenosis. LAD gives
rise to a large diagonal branch (almost like dual LAD).

Proximal and mid LAD has a long moderate circumferential calcified
plague with associated 25-50% stenosis. Distal LAD has only mild
luminal irregularities.

First diagonal branch has calcified plague with associated 0-25%
stenosis.

LCX is medium size non-dominant vessel. There is mild calcified
plague in its mid segment associated with 0-25% stenosis.

RCA is a large dominant vessel. There is mild noncalcified plague in
the mid RCA associated with 25-50% stenosis. Distal RCA has minimal
calcified plague associated with <25% stenosis.

RCA gives rise to a large acute marginal branch that supplies PDA
territory and a large PLVB. They both have only minimal calcified
plague with associated 0-25% stenosis.
IMPRESSION: 1. Moderate diffuse non-obstructive CAD. Aggressive medical
management is recommended.

Yashish Primrose

EXAM:
OVER-READ INTERPRETATION  CT CHEST

The following report is an over-read performed by radiologist Dr.
over-read does not include interpretation of cardiac or coronary
anatomy or pathology. The interpretation by the cardiologist is
attached.
FINDINGS: Multiple tiny pulmonary nodules scattered throughout the lung bases
bilaterally, several which are calcified. Specific examples include
the following: 3 mm nodule in the lateral segment of the right
middle lobe (image 27 of series 5), 2 mm right lower lobe nodule
(image 40 of series 5), 2 mm right lower lobe nodule (image 27 of
series 5), and a 4 mm calcified granuloma in the medial aspect of
the left lower lobe (image 31 of series 5). No other larger more
suspicious appearing pulmonary nodules or masses are otherwise noted
in the visualized portions of the thorax. No consolidative airspace
disease, pneumothorax or pleural effusions in the visualized thorax.
Large hiatal hernia. Visualized portions of the upper abdomen are
unremarkable. There are no aggressive appearing lytic or blastic
lesions noted in the visualized portions of the skeleton.
IMPRESSION: 1. No acute incidental noncardiac findings to account for the
patient's symptoms.
2. However, the patient does have a large hiatal hernia.
3. Multiple small pulmonary nodules scattered throughout the lung
bases bilaterally, some of which are calcified. These are favored to
represent benign disease, likely a mixture of calcified and
noncalcified granulomas, however, follow-up studies may be
appropriate. The largest noncalcified nodule is a 3 mm nodule in the
lateral segment of the right middle lobe (image 27 of series 5). If
the patient is at high risk for bronchogenic carcinoma, follow-up
chest CT at 9year is recommended. If the patient is at low risk, no
follow-up is needed. This recommendation follows the consensus
statement: Guidelines for Management of Small Pulmonary Nodules
Detected on CT Scans: A Statement from the [HOSPITAL] as

## 2015-08-01 ENCOUNTER — Other Ambulatory Visit: Payer: Self-pay | Admitting: Cardiology

## 2015-08-13 ENCOUNTER — Ambulatory Visit: Payer: Medicare Other | Admitting: Cardiology

## 2015-09-18 ENCOUNTER — Other Ambulatory Visit: Payer: Self-pay | Admitting: Gastroenterology

## 2015-09-18 DIAGNOSIS — Z1211 Encounter for screening for malignant neoplasm of colon: Secondary | ICD-10-CM | POA: Diagnosis not present

## 2015-09-18 DIAGNOSIS — D122 Benign neoplasm of ascending colon: Secondary | ICD-10-CM | POA: Diagnosis not present

## 2015-09-18 DIAGNOSIS — D124 Benign neoplasm of descending colon: Secondary | ICD-10-CM | POA: Diagnosis not present

## 2015-09-18 DIAGNOSIS — D126 Benign neoplasm of colon, unspecified: Secondary | ICD-10-CM | POA: Diagnosis not present

## 2015-09-18 DIAGNOSIS — K621 Rectal polyp: Secondary | ICD-10-CM | POA: Diagnosis not present

## 2015-09-18 DIAGNOSIS — D125 Benign neoplasm of sigmoid colon: Secondary | ICD-10-CM | POA: Diagnosis not present

## 2015-10-12 DIAGNOSIS — R05 Cough: Secondary | ICD-10-CM | POA: Diagnosis not present

## 2015-10-12 DIAGNOSIS — R509 Fever, unspecified: Secondary | ICD-10-CM | POA: Diagnosis not present

## 2015-10-12 DIAGNOSIS — Z6827 Body mass index (BMI) 27.0-27.9, adult: Secondary | ICD-10-CM | POA: Diagnosis not present

## 2015-10-12 DIAGNOSIS — J209 Acute bronchitis, unspecified: Secondary | ICD-10-CM | POA: Diagnosis not present

## 2015-11-05 DIAGNOSIS — Z961 Presence of intraocular lens: Secondary | ICD-10-CM | POA: Diagnosis not present

## 2015-11-05 DIAGNOSIS — H26493 Other secondary cataract, bilateral: Secondary | ICD-10-CM | POA: Insufficient documentation

## 2015-11-05 DIAGNOSIS — H35372 Puckering of macula, left eye: Secondary | ICD-10-CM | POA: Diagnosis not present

## 2015-11-05 DIAGNOSIS — H35371 Puckering of macula, right eye: Secondary | ICD-10-CM | POA: Diagnosis not present

## 2015-11-23 DIAGNOSIS — H903 Sensorineural hearing loss, bilateral: Secondary | ICD-10-CM | POA: Diagnosis not present

## 2016-02-01 DIAGNOSIS — H26493 Other secondary cataract, bilateral: Secondary | ICD-10-CM | POA: Diagnosis not present

## 2016-02-01 DIAGNOSIS — Z961 Presence of intraocular lens: Secondary | ICD-10-CM | POA: Diagnosis not present

## 2016-03-16 DIAGNOSIS — I10 Essential (primary) hypertension: Secondary | ICD-10-CM | POA: Diagnosis not present

## 2016-03-16 DIAGNOSIS — Z125 Encounter for screening for malignant neoplasm of prostate: Secondary | ICD-10-CM | POA: Diagnosis not present

## 2016-03-16 DIAGNOSIS — E784 Other hyperlipidemia: Secondary | ICD-10-CM | POA: Diagnosis not present

## 2016-03-29 DIAGNOSIS — Z6828 Body mass index (BMI) 28.0-28.9, adult: Secondary | ICD-10-CM | POA: Diagnosis not present

## 2016-03-29 DIAGNOSIS — G4733 Obstructive sleep apnea (adult) (pediatric): Secondary | ICD-10-CM | POA: Diagnosis not present

## 2016-03-29 DIAGNOSIS — E784 Other hyperlipidemia: Secondary | ICD-10-CM | POA: Diagnosis not present

## 2016-03-29 DIAGNOSIS — Z1389 Encounter for screening for other disorder: Secondary | ICD-10-CM | POA: Diagnosis not present

## 2016-03-29 DIAGNOSIS — I10 Essential (primary) hypertension: Secondary | ICD-10-CM | POA: Diagnosis not present

## 2016-03-29 DIAGNOSIS — Z0000 Encounter for general adult medical examination without abnormal findings: Secondary | ICD-10-CM | POA: Diagnosis not present

## 2016-03-29 DIAGNOSIS — F0390 Unspecified dementia without behavioral disturbance: Secondary | ICD-10-CM | POA: Diagnosis not present

## 2016-05-04 DIAGNOSIS — D1801 Hemangioma of skin and subcutaneous tissue: Secondary | ICD-10-CM | POA: Diagnosis not present

## 2016-05-04 DIAGNOSIS — L570 Actinic keratosis: Secondary | ICD-10-CM | POA: Diagnosis not present

## 2016-05-04 DIAGNOSIS — L821 Other seborrheic keratosis: Secondary | ICD-10-CM | POA: Diagnosis not present

## 2016-05-14 DIAGNOSIS — Z23 Encounter for immunization: Secondary | ICD-10-CM | POA: Diagnosis not present

## 2016-06-13 DIAGNOSIS — N35011 Post-traumatic bulbous urethral stricture: Secondary | ICD-10-CM | POA: Diagnosis not present

## 2016-06-13 DIAGNOSIS — N400 Enlarged prostate without lower urinary tract symptoms: Secondary | ICD-10-CM | POA: Diagnosis not present

## 2016-06-21 ENCOUNTER — Encounter: Payer: Self-pay | Admitting: Cardiology

## 2016-07-01 ENCOUNTER — Encounter: Payer: Self-pay | Admitting: Cardiology

## 2016-07-01 ENCOUNTER — Ambulatory Visit (INDEPENDENT_AMBULATORY_CARE_PROVIDER_SITE_OTHER): Payer: Medicare Other | Admitting: Cardiology

## 2016-07-01 ENCOUNTER — Other Ambulatory Visit: Payer: Self-pay | Admitting: Cardiology

## 2016-07-01 VITALS — BP 112/56 | HR 59 | Ht 70.0 in | Wt 191.0 lb

## 2016-07-01 DIAGNOSIS — I2510 Atherosclerotic heart disease of native coronary artery without angina pectoris: Secondary | ICD-10-CM

## 2016-07-01 DIAGNOSIS — I6523 Occlusion and stenosis of bilateral carotid arteries: Secondary | ICD-10-CM

## 2016-07-01 DIAGNOSIS — E7849 Other hyperlipidemia: Secondary | ICD-10-CM

## 2016-07-01 DIAGNOSIS — E784 Other hyperlipidemia: Secondary | ICD-10-CM

## 2016-07-01 DIAGNOSIS — R413 Other amnesia: Secondary | ICD-10-CM | POA: Diagnosis not present

## 2016-07-01 DIAGNOSIS — R072 Precordial pain: Secondary | ICD-10-CM

## 2016-07-01 MED ORDER — PRAVASTATIN SODIUM 10 MG PO TABS: 10.0000 mg | ORAL_TABLET | Freq: Every day | ORAL | 3 refills | Status: DC

## 2016-07-01 NOTE — Patient Instructions (Signed)
Medication Instructions:   DECREASE YOUR PRAVASTATIN TO 10 MG ONCE DAILY    Follow-Up:  Your physician wants you to follow-up in: [place] will receive a reminder letter in the mail two months in advance. If you don't receive a letter, please call our office to schedule the follow-up appointment.        If you need a refill on your cardiac medications before your next appointment, please call your pharmacy.

## 2016-07-01 NOTE — Progress Notes (Signed)
Patient ID: [name], male   DOB: Dec 01, 1940, 75 y.o.   MRN: [id number]    Patient Name: [name] Date of Encounter: 07/01/2016  Primary Care Provider:  [name], MD Primary Cardiologist:  Ena Dawley  Problem List   Past Medical History:  Diagnosis Date  . Acid reflux    a. well-controlled with ranitidine.  Marland Kitchen BPH (benign prostatic hyperplasia)    a. s/p TURP 2012.  . Essential hypertension 09/10/2013  . Eye problem    a. L eye blurred vision - seeing eye doctor for tissue pulling away from eye.  . Hard of hearing   . Hiatal hernia    a. Small-mod by CXR 2015.  Marland Kitchen High cholesterol    Past Surgical History:  Procedure Laterality Date  . PARS PLANA VITRECTOMY Left 2015   WITH MEMBRANE PEEL, 23 GAUGE  . TRANSURETHRAL RESECTION OF PROSTATE    . Urethral stricture surgery     Allergies  Allergies  Allergen Reactions  . Sulfa Antibiotics Nausea Only and Rash  . Sulfamethoxazole Nausea Only and Rash  . Sulfasalazine Nausea Only and Rash   HPI  The patient seen in the ER on 09/05/2012 with chest pain after 30 minutes on the treadmill (please see consult note for full history). Coronary CT showed moderate non-obstructive CAD, discharged from the ED. the patient was started on statins and is coming for her six-month followup. In the meanwhile he underwent retinal surgery and is doing well with exercise restriction. He is still exercising just avoiding bending down and lifting weights. He is completely asymptomatic and denies any side effects of Lipitor is concerned about impaired memory with Lipitor.  07/01/2016 - the patient is coming after 1 year, He is doing well, he has had no change in his symptoms and denies any chest pain or shortness of breath. He has been dealing with balance problems but is able to go to gym twice a week and complete his exercise routine without difficulties. No palpitations or syncope. He is major problem is memory loss.   Home  Medications  Prior to Admission medications   Medication Sig Start Date End Date Taking? Authorizing Provider  aspirin 81 MG tablet Take 81 mg by mouth daily.   Yes Historical Provider, MD  Bromfenac Sodium 0.09 % SOLN Place 1 drop into the left eye 2 (two) times daily. 08/20/13  Yes Historical Provider, MD  Calcium Carbonate-Vitamin D (CALTRATE 600+D PO) Take 1 tablet by mouth daily.   Yes Historical Provider, MD  ketorolac (ACULAR) 0.5 % ophthalmic solution Place 1 drop into the left eye 2 (two) times daily. 07/07/13  Yes Historical Provider, MD  meclizine (ANTIVERT) 25 MG tablet Take 25 mg by mouth daily. 06/05/13  Yes Historical Provider, MD  Multiple Vitamins-Minerals (CENTRUM SILVER PO) Take 1 tablet by mouth daily.   Yes Historical Provider, MD  Phosphatidylserine-DHA-EPA (VAYACOG PO) Take 1 capsule by mouth daily.   Yes Historical Provider, MD  pravastatin (PRAVACHOL) 40 MG tablet Take 40 mg by mouth daily. 06/05/13  Yes Historical Provider, MD  ranitidine (ZANTAC) 150 MG capsule Take 150 mg by mouth 2 (two) times daily. 09/03/13  Yes Historical Provider, MD  rosuvastatin (CRESTOR) 20 MG tablet Take 1 tablet (20 mg total) by mouth daily. 09/05/13   Jarrett Soho Muthersbaugh, PA-C   Family History  Family History  Problem Relation Age of Onset  . Other Mother     Died at 58 - "old age"  . Stroke  Father     Died at 9  . CAD Neg Hx     Social History  Social History   Social History  . Marital status: Married    Spouse name: N/A  . Number of children: N/A  . Years of education: N/A   Occupational History  . Not on file.   Social History Main Topics  . Smoking status: Never Smoker  . Smokeless tobacco: Never Used  . Alcohol use Yes     Comment: Once per month  . Drug use: No  . Sexual activity: Not on file   Other Topics Concern  . Not on file   Social History Narrative  . No narrative on file     Review of Systems, as per HPI, otherwise negative General:  No chills,  fever, night sweats or weight changes.  Cardiovascular:  No chest pain, dyspnea on exertion, edema, orthopnea, palpitations, paroxysmal nocturnal dyspnea. Dermatological: No rash, lesions/masses Respiratory: No cough, dyspnea Urologic: No hematuria, dysuria Abdominal:   No nausea, vomiting, diarrhea, bright red blood per rectum, melena, or hematemesis Neurologic:  No visual changes, wkns, changes in mental status. All other systems reviewed and are otherwise negative except as noted above.  Physical Exam  Blood pressure (!) 112/56, pulse (!) 59, height 5\' 10"  (1.778 m), weight 191 lb (86.6 kg).  General: Pleasant, NAD Psych: Normal affect. Neuro: Alert and oriented X 3. Moves all extremities spontaneously. HEENT: Normal  Neck: Supple without bruits or JVD. Lungs:  Resp regular and unlabored, CTA. Heart: RRR no s3, s4, or murmurs. Abdomen: Soft, non-tender, non-distended, BS + x 4.  Extremities: No clubbing, cyanosis or edema. DP/PT/Radials 2+ and equal bilaterally.  Labs:  No results for input(s): CKTOTAL, CKMB, TROPONINI in the last 72 hours. Lab Results  Component Value Date   WBC 5.2 01/20/2015   HGB 15.0 01/20/2015   HCT 44.6 01/20/2015   MCV 93.3 01/20/2015   PLT 219 01/20/2015   No results for input(s): NA, K, CL, CO2, BUN, CREATININE, CALCIUM, PROT, BILITOT, ALKPHOS, ALT, AST, GLUCOSE in the last 168 hours.  Invalid input(s): LABALBU Accessory Clinical Findings  Echocardiogram - none  ECG - SR, normal ECG   Assessment & Plan  1. Atypical chest pain - with moderate diffuse non-obstructive CAD on coronary CT, started on pravastatin 20 mg po daily that he tolerates well however he has memory issues and will decrease to 10 mg daily. We will continue with aspirin 81 mg daily, he is on no other medicines since his blood pressure and heart rate are low.   2.  Memory loss -  Decrease pravastatin to 10 mg po daily, ultrasound of the carotids bilaterally showed minimal  plaque in 2015.  3.  Hyperlipidemia -  As above.  Follow up in 1 year.   Ena Dawley, MD, [facility] 07/01/2016, 11:37 AM

## 2016-08-08 DIAGNOSIS — H26493 Other secondary cataract, bilateral: Secondary | ICD-10-CM | POA: Diagnosis not present

## 2016-08-08 DIAGNOSIS — Z961 Presence of intraocular lens: Secondary | ICD-10-CM | POA: Diagnosis not present

## 2016-09-30 DIAGNOSIS — H26493 Other secondary cataract, bilateral: Secondary | ICD-10-CM | POA: Diagnosis not present

## 2016-09-30 DIAGNOSIS — Z961 Presence of intraocular lens: Secondary | ICD-10-CM | POA: Diagnosis not present

## 2016-10-28 DIAGNOSIS — Z961 Presence of intraocular lens: Secondary | ICD-10-CM | POA: Diagnosis not present

## 2016-10-28 DIAGNOSIS — H35372 Puckering of macula, left eye: Secondary | ICD-10-CM | POA: Diagnosis not present

## 2016-10-28 DIAGNOSIS — H26492 Other secondary cataract, left eye: Secondary | ICD-10-CM | POA: Diagnosis not present

## 2016-11-17 DIAGNOSIS — H35372 Puckering of macula, left eye: Secondary | ICD-10-CM | POA: Diagnosis not present

## 2017-04-11 DIAGNOSIS — Z125 Encounter for screening for malignant neoplasm of prostate: Secondary | ICD-10-CM | POA: Diagnosis not present

## 2017-04-11 DIAGNOSIS — E784 Other hyperlipidemia: Secondary | ICD-10-CM | POA: Diagnosis not present

## 2017-04-11 DIAGNOSIS — I10 Essential (primary) hypertension: Secondary | ICD-10-CM | POA: Diagnosis not present

## 2017-04-18 DIAGNOSIS — Z6827 Body mass index (BMI) 27.0-27.9, adult: Secondary | ICD-10-CM | POA: Diagnosis not present

## 2017-04-18 DIAGNOSIS — R351 Nocturia: Secondary | ICD-10-CM | POA: Diagnosis not present

## 2017-04-18 DIAGNOSIS — Z0000 Encounter for general adult medical examination without abnormal findings: Secondary | ICD-10-CM | POA: Diagnosis not present

## 2017-04-18 DIAGNOSIS — G4733 Obstructive sleep apnea (adult) (pediatric): Secondary | ICD-10-CM | POA: Diagnosis not present

## 2017-04-18 DIAGNOSIS — E784 Other hyperlipidemia: Secondary | ICD-10-CM | POA: Diagnosis not present

## 2017-04-18 DIAGNOSIS — I10 Essential (primary) hypertension: Secondary | ICD-10-CM | POA: Diagnosis not present

## 2017-04-18 DIAGNOSIS — F0390 Unspecified dementia without behavioral disturbance: Secondary | ICD-10-CM | POA: Diagnosis not present

## 2017-04-18 DIAGNOSIS — Z1389 Encounter for screening for other disorder: Secondary | ICD-10-CM | POA: Diagnosis not present

## 2017-04-18 DIAGNOSIS — G4734 Idiopathic sleep related nonobstructive alveolar hypoventilation: Secondary | ICD-10-CM | POA: Diagnosis not present

## 2017-04-24 DIAGNOSIS — Z1212 Encounter for screening for malignant neoplasm of rectum: Secondary | ICD-10-CM | POA: Diagnosis not present

## 2017-05-09 DIAGNOSIS — D2372 Other benign neoplasm of skin of left lower limb, including hip: Secondary | ICD-10-CM | POA: Diagnosis not present

## 2017-05-09 DIAGNOSIS — L821 Other seborrheic keratosis: Secondary | ICD-10-CM | POA: Diagnosis not present

## 2017-05-09 DIAGNOSIS — L570 Actinic keratosis: Secondary | ICD-10-CM | POA: Diagnosis not present

## 2017-05-09 DIAGNOSIS — D225 Melanocytic nevi of trunk: Secondary | ICD-10-CM | POA: Diagnosis not present

## 2017-05-27 DIAGNOSIS — Z23 Encounter for immunization: Secondary | ICD-10-CM | POA: Diagnosis not present

## 2017-06-16 DIAGNOSIS — N35014 Post-traumatic urethral stricture, male, unspecified: Secondary | ICD-10-CM | POA: Diagnosis not present

## 2017-06-16 DIAGNOSIS — N400 Enlarged prostate without lower urinary tract symptoms: Secondary | ICD-10-CM | POA: Diagnosis not present

## 2017-06-23 ENCOUNTER — Other Ambulatory Visit: Payer: Self-pay | Admitting: Cardiology

## 2017-06-23 DIAGNOSIS — E7849 Other hyperlipidemia: Secondary | ICD-10-CM

## 2017-08-15 ENCOUNTER — Encounter (INDEPENDENT_AMBULATORY_CARE_PROVIDER_SITE_OTHER): Payer: Self-pay

## 2017-08-15 ENCOUNTER — Ambulatory Visit (INDEPENDENT_AMBULATORY_CARE_PROVIDER_SITE_OTHER): Payer: Medicare Other | Admitting: Cardiology

## 2017-08-15 ENCOUNTER — Encounter: Payer: Self-pay | Admitting: Cardiology

## 2017-08-15 VITALS — BP 126/60 | HR 58 | Ht 70.0 in | Wt 189.0 lb

## 2017-08-15 DIAGNOSIS — R413 Other amnesia: Secondary | ICD-10-CM

## 2017-08-15 DIAGNOSIS — E7849 Other hyperlipidemia: Secondary | ICD-10-CM | POA: Diagnosis not present

## 2017-08-15 DIAGNOSIS — R072 Precordial pain: Secondary | ICD-10-CM

## 2017-08-15 DIAGNOSIS — I6523 Occlusion and stenosis of bilateral carotid arteries: Secondary | ICD-10-CM | POA: Diagnosis not present

## 2017-08-15 DIAGNOSIS — I2510 Atherosclerotic heart disease of native coronary artery without angina pectoris: Secondary | ICD-10-CM

## 2017-08-15 MED ORDER — FISH OIL 1000 MG PO CAPS: 1000.0000 mg | ORAL_CAPSULE | Freq: Every day | ORAL | 0 refills | Status: DC

## 2017-08-15 NOTE — Progress Notes (Signed)
Patient ID: ADARSH MUNDORF, male   DOB: 03-21-1941, 76 y.o.   MRN: [id number]    Patient Name: [name] Date of Encounter: 08/15/2017  Primary Care Provider:  Leanna Battles, MD Primary Cardiologist:  Ena Dawley  Problem List   Past Medical History:  Diagnosis Date  . Acid reflux    a. well-controlled with ranitidine.  Marland Kitchen BPH (benign prostatic hyperplasia)    a. s/p TURP 2012.  . Essential hypertension 09/10/2013  . Eye problem    a. L eye blurred vision - seeing eye doctor for tissue pulling away from eye.  . Hard of hearing   . Hiatal hernia    a. Small-mod by CXR 2015.  Marland Kitchen High cholesterol    Past Surgical History:  Procedure Laterality Date  . PARS PLANA VITRECTOMY Left 2015   WITH MEMBRANE PEEL, 23 GAUGE  . TRANSURETHRAL RESECTION OF PROSTATE    . Urethral stricture surgery     Allergies  Allergies  Allergen Reactions  . Sulfa Antibiotics Nausea Only and Rash  . Sulfamethoxazole Nausea Only and Rash  . Sulfasalazine Nausea Only and Rash   HPI  The patient seen in the ER on 09/05/2012 with chest pain after 30 minutes on the treadmill (please see consult note for full history). Coronary CT showed moderate non-obstructive CAD, discharged from the ED. the patient was started on statins and is coming for her six-month followup. In the meanwhile he underwent retinal surgery and is doing well with exercise restriction. He is still exercising just avoiding bending down and lifting weights. He is completely asymptomatic and denies any side effects of Lipitor is concerned about impaired memory with Lipitor.  07/01/2016 - the patient is coming after 1 year, He is doing well, he has had no change in his symptoms and denies any chest pain or shortness of breath. He has been dealing with balance problems but is able to go to gym twice a week and complete his exercise routine without difficulties. No palpitations or syncope. He is major problem is memory loss.   08/15/2017 -  the patient is coming after one year, he is feeling great, he goes to gym and exercises with a personal trainer twice a week and has no chest pain shortness of breath no claudication, palpitations. Syncope. He occasionally feels dizzy when he gets up. He has been compliant to medications and has no side effects. No bleeding. His pravastatin was discontinued by Dr. Sharlett Iles as he has progressively worsening memory issues.  Home Medications  Prior to Admission medications   Medication Sig Start Date End Date Taking? Authorizing Provider  aspirin 81 MG tablet Take 81 mg by mouth daily.   Yes Historical Provider, MD  Bromfenac Sodium 0.09 % SOLN Place 1 drop into the left eye 2 (two) times daily. 08/20/13  Yes Historical Provider, MD  Calcium Carbonate-Vitamin D (CALTRATE 600+D PO) Take 1 tablet by mouth daily.   Yes Historical Provider, MD  ketorolac (ACULAR) 0.5 % ophthalmic solution Place 1 drop into the left eye 2 (two) times daily. 07/07/13  Yes Historical Provider, MD  meclizine (ANTIVERT) 25 MG tablet Take 25 mg by mouth daily. 06/05/13  Yes Historical Provider, MD  Multiple Vitamins-Minerals (CENTRUM SILVER PO) Take 1 tablet by mouth daily.   Yes Historical Provider, MD  Phosphatidylserine-DHA-EPA (VAYACOG PO) Take 1 capsule by mouth daily.   Yes Historical Provider, MD  pravastatin (PRAVACHOL) 40 MG tablet Take 40 mg by mouth daily. 06/05/13  Yes Historical  Provider, MD  ranitidine (ZANTAC) 150 MG capsule Take 150 mg by mouth 2 (two) times daily. 09/03/13  Yes Historical Provider, MD  rosuvastatin (CRESTOR) 20 MG tablet Take 1 tablet (20 mg total) by mouth daily. 09/05/13   Jarrett Soho Muthersbaugh, PA-C   Family History  Family History  Problem Relation Age of Onset  . Other Mother        Died at 101 - "old age"  . Stroke Father        Died at 3  . CAD Neg Hx     Social History  Social History   Socioeconomic History  . Marital status: Married    Spouse name: Not on file  . Number of  children: Not on file  . Years of education: Not on file  . Highest education level: Not on file  Social Needs  . Financial resource strain: Not on file  . Food insecurity - worry: Not on file  . Food insecurity - inability: Not on file  . Transportation needs - medical: Not on file  . Transportation needs - non-medical: Not on file  Occupational History  . Not on file  Tobacco Use  . Smoking status: Never Smoker  . Smokeless tobacco: Never Used  Substance and Sexual Activity  . Alcohol use: Yes    Comment: Once per month  . Drug use: No  . Sexual activity: Not on file  Other Topics Concern  . Not on file  Social History Narrative  . Not on file     Review of Systems, as per HPI, otherwise negative General:  No chills, fever, night sweats or weight changes.  Cardiovascular:  No chest pain, dyspnea on exertion, edema, orthopnea, palpitations, paroxysmal nocturnal dyspnea. Dermatological: No rash, lesions/masses Respiratory: No cough, dyspnea Urologic: No hematuria, dysuria Abdominal:   No nausea, vomiting, diarrhea, bright red blood per rectum, melena, or hematemesis Neurologic:  No visual changes, wkns, changes in mental status. All other systems reviewed and are otherwise negative except as noted above.  Physical Exam  Blood pressure 126/60, pulse (!) 58, height 5\' 10"  (1.778 m), weight 189 lb (85.7 kg).  General: Pleasant, NAD Psych: Normal affect. Neuro: Alert and oriented X 3. Moves all extremities spontaneously. HEENT: Normal  Neck: Supple without bruits or JVD. Lungs:  Resp regular and unlabored, CTA. Heart: RRR no s3, s4, or murmurs. Abdomen: Soft, non-tender, non-distended, BS + x 4.  Extremities: No clubbing, cyanosis or edema. DP/PT/Radials 2+ and equal bilaterally.  Labs:  No results for input(s): CKTOTAL, CKMB, TROPONINI in the last 72 hours. Lab Results  Component Value Date   WBC 5.2 01/20/2015   HGB 15.0 01/20/2015   HCT 44.6 01/20/2015   MCV  93.3 01/20/2015   PLT 219 01/20/2015   No results for input(s): NA, K, CL, CO2, BUN, CREATININE, CALCIUM, PROT, BILITOT, ALKPHOS, ALT, AST, GLUCOSE in the last 168 hours.  Invalid input(s): LABALBU Accessory Clinical Findings  Echocardiogram - none  ECG - performed today 08/15/2017 and personally reviewed, shows sinus bradycardia otherwise normal EKG unchanged from prior.   Assessment & Plan  1. Atypical chest pain - with moderate diffuse non-obstructive CAD on coronary CT in May 2016, started on pravastatin 20 mg po daily, however this was discontinued as he has progressively worsening memory issues, he is physically active and asymptomatic we will continue aspirin only. His EKG remains normal.  2.  Memory loss -  discontinued pravastatin to 10 mg po daily, ultrasound of the carotids  bilaterally showed minimal plaque in 2015.  3.  Hyperlipidemia -  As above.  Follow up in 1 year.   Ena Dawley, MD, [facility] 08/15/2017, 10:22 AM

## 2017-08-15 NOTE — Patient Instructions (Signed)
Medication Instructions:   START TAKING OVER-THE-COUNTER FISH OIL 1000 MG ONCE DAILY    Follow-Up:  Your physician wants you to follow-up in: [place] will receive a reminder letter in the mail two months in advance. If you don't receive a letter, please call our office to schedule the follow-up appointment.        If you need a refill on your cardiac medications before your next appointment, please call your pharmacy.

## 2017-09-12 DIAGNOSIS — Z1389 Encounter for screening for other disorder: Secondary | ICD-10-CM | POA: Diagnosis not present

## 2017-09-12 DIAGNOSIS — I10 Essential (primary) hypertension: Secondary | ICD-10-CM | POA: Diagnosis not present

## 2017-09-12 DIAGNOSIS — Z6828 Body mass index (BMI) 28.0-28.9, adult: Secondary | ICD-10-CM | POA: Diagnosis not present

## 2017-09-12 DIAGNOSIS — E7849 Other hyperlipidemia: Secondary | ICD-10-CM | POA: Diagnosis not present

## 2017-09-12 DIAGNOSIS — F0390 Unspecified dementia without behavioral disturbance: Secondary | ICD-10-CM | POA: Diagnosis not present

## 2017-09-12 DIAGNOSIS — G4733 Obstructive sleep apnea (adult) (pediatric): Secondary | ICD-10-CM | POA: Diagnosis not present

## 2017-09-13 ENCOUNTER — Other Ambulatory Visit: Payer: Self-pay | Admitting: Cardiology

## 2017-09-13 DIAGNOSIS — H903 Sensorineural hearing loss, bilateral: Secondary | ICD-10-CM | POA: Diagnosis not present

## 2017-09-13 DIAGNOSIS — E7849 Other hyperlipidemia: Secondary | ICD-10-CM

## 2017-10-10 ENCOUNTER — Encounter: Payer: Self-pay | Admitting: Neurology

## 2017-10-11 ENCOUNTER — Ambulatory Visit (INDEPENDENT_AMBULATORY_CARE_PROVIDER_SITE_OTHER): Payer: Medicare Other | Admitting: Neurology

## 2017-10-11 ENCOUNTER — Encounter: Payer: Self-pay | Admitting: Neurology

## 2017-10-11 ENCOUNTER — Encounter (INDEPENDENT_AMBULATORY_CARE_PROVIDER_SITE_OTHER): Payer: Self-pay

## 2017-10-11 VITALS — BP 107/59 | HR 62 | Ht 70.0 in | Wt 193.0 lb

## 2017-10-11 DIAGNOSIS — F0280 Dementia in other diseases classified elsewhere without behavioral disturbance: Secondary | ICD-10-CM

## 2017-10-11 DIAGNOSIS — G4733 Obstructive sleep apnea (adult) (pediatric): Secondary | ICD-10-CM

## 2017-10-11 DIAGNOSIS — G309 Alzheimer's disease, unspecified: Secondary | ICD-10-CM | POA: Diagnosis not present

## 2017-10-11 DIAGNOSIS — G3184 Mild cognitive impairment, so stated: Secondary | ICD-10-CM

## 2017-10-11 NOTE — Progress Notes (Addendum)
SLEEP MEDICINE CLINIC   Provider:  Larey Seat, M D  Primary Care Physician:  Leanna Battles, MD   Referring Provider: Leanna Battles, MD   Chief Complaint  Patient presents with  . New Patient (Initial Visit)    pt with wife, rm 32. pt presents today with difficulty sleeping well at night. pt uses a oxygen concentrater which is prescribed by Dr Philip Aspen. pt has been struggling with memory. pts wife denies snoring or apneic spells in his sleep.     HPI:  LANGDON CROSSON is a 77 y.o. male , seen here  in a referral   from Dr. Philip Aspen for evaluation of sleep medicine needs.  The patient has been evaluated for OSA last in 2008 and was using a oxygen concentrator, originally seen by me in the same year he was issued a CPAP machine. He could not get comfortable with CPAP, had air leaks, and aerophagia. Oxygen was finally prescribed alone - a compromise to maintain SpO2 .  During the last visit with his primary care physician, Dr. Juliette Mangle, the patient had also been tested for short-term memory concerns.  Reported that some people he had known for many years he could no longer remember by the names.  He is also easily distracted, sometimes forgets what day of the week it is.  He also discussed via cog.  In an annual physical examination in August 2018 pravastatin was discontinued and he started DDAVP  to reduce nocturia and improve sleep quality.  October 2018 his urologist confirmed that she seemed to be doing well.  He has also used gabapentin 100 mg at bedtime which has helped with sleep.  Cardiologist last visited in December 2018 felt he was stable with no changes to EKG.  Chief complaint according to patient :  "I have no sleep issues"   Sleep habits are as follows:  He drinks a glass of tea at dinner, sometimes cola. The patient reports that sometimes he will dose off before his wife. He goes to bed by 9.30 and reads in bed.  He is using an oxygen concentrator at 2 L with  a nasal cannula.  His nocturia has been reduced to once at night after he started DDAVP.  His wife does not report that he snores, she has not witnessed any apneas.  He seems to sleep well and through the night until 6.30 when he rises, he wakes spontaneously. The couple exercises twice a week in AM with a trainer.  Drinks coffee in AM, feels refreshed most of the time, no dry mouth, dizziness, nausea, palpitations or headaches.  During the  daytime he  falls asleep easily and not physically active or mentally stimulated. He may stay asleep for about 30 minutes.   Sleep medical history and family sleep history: An adult son suffered a stroke and is currently living with his family again, there are no other sleep disorders or history of sleep disorders known among the blood relations of Mr. Olivencia.- history of tonsillectomy, neck surgery, traumatic brain injury in the patient. No sleep walking, enuresis or night terrors.    Social history: adult son suffered a stroke and now lives with parents, again. Another adult son lives in [place], 3 grandchildren. Non smoker. ETOH 2 times / month.   Review of Systems: Out of a complete 14 system review, the patient complains of only the following symptoms, and all other reviewed systems are negative.  Patient is here mainly for a reevaluation  if he has sleep apnea or not.  Dr. Sharlett Iles also is concerned that possible low oxygen levels at night or an undetected sleep disorder may contribute to the patient's recent cognitive challenges.  He reports memory loss, some confusion, some anxiety, cough and hearing loss.  He is status post TURP transurethral resection of the prostate.  Reviewed his medications.  Epworth score 11 , Fatigue severity score 13  , depression score 4/15    Social History   Socioeconomic History  . Marital status: Married    Spouse name: Not on file  . Number of children: Not on file  . Years of education: Not on file  . Highest education  level: Not on file  Social Needs  . Financial resource strain: Not on file  . Food insecurity - worry: Not on file  . Food insecurity - inability: Not on file  . Transportation needs - medical: Not on file  . Transportation needs - non-medical: Not on file  Occupational History  . Not on file  Tobacco Use  . Smoking status: Never Smoker  . Smokeless tobacco: Never Used  Substance and Sexual Activity  . Alcohol use: Yes    Comment: Once per month  . Drug use: No  . Sexual activity: Not on file  Other Topics Concern  . Not on file  Social History Narrative  . Not on file    Family History  Problem Relation Age of Onset  . Other Mother        Died at 50 - "old age"  . Stroke Father        Died at 66  . Tuberculosis Paternal Grandfather   . CAD Neg Hx     Past Medical History:  Diagnosis Date  . Acid reflux    a. well-controlled with ranitidine.  Marland Kitchen BPH (benign prostatic hyperplasia)    a. s/p TURP 2012.  . Essential hypertension 09/10/2013  . Eye problem    a. L eye blurred vision - seeing eye doctor for tissue pulling away from eye.  . Hard of hearing   . Hiatal hernia    a. Small-mod by CXR 2015.  Marland Kitchen High cholesterol     Past Surgical History:  Procedure Laterality Date  . PARS PLANA VITRECTOMY Left 2015   WITH MEMBRANE PEEL, 23 GAUGE  . TRANSURETHRAL RESECTION OF PROSTATE    . Urethral stricture surgery      Current Outpatient Medications  Medication Sig Dispense Refill  . acetaminophen (TYLENOL) 500 MG tablet Take 1,000 mg by mouth every 6 (six) hours as needed for mild pain or moderate pain.    Marland Kitchen aspirin 81 MG tablet Take 81 mg by mouth daily.    . Calcium Carbonate-Vitamin D (CALTRATE 600+D PO) Take 1 tablet by mouth daily.    Marland Kitchen COENZYME Q-10 PO Take 1 tablet by mouth daily.    Marland Kitchen desmopressin (DDAVP) 0.2 MG tablet   0  . gabapentin (NEURONTIN) 100 MG capsule TAKE 1 CAPSULE BY MOUTH EVERYDAY AT BEDTIME  1  . meclizine (ANTIVERT) 25 MG tablet Take 25 mg  by mouth daily.    . memantine (NAMENDA) 5 MG tablet Take 1 tablet by mouth 2 (two) times daily.    . Multiple Vitamins-Minerals (CENTRUM SILVER PO) Take 1 tablet by mouth daily.    . Omega-3 Fatty Acids (FISH OIL) 1000 MG CAPS Take 1 capsule (1,000 mg total) by mouth daily.  0  . Polyethyl Glycol-Propyl Glycol (SYSTANE OP) Apply  2 drops to eye daily as needed (Dry eyes). 2 drops each eye    . pravastatin (PRAVACHOL) 20 MG tablet Take 20 mg by mouth daily.  12  . ranitidine (ZANTAC) 150 MG capsule Take 150 mg by mouth 2 (two) times daily.    . ranitidine (ZANTAC) 150 MG tablet Take 150 mg by mouth 2 (two) times daily.  11  . VAYACOG 100-19.5-6.5 MG CAPS Take 1 capsule by mouth daily.  3   No current facility-administered medications for this visit.     Allergies as of 10/11/2017 - Review Complete 10/11/2017  Allergen Reaction Noted  . Sulfa antibiotics Nausea Only and Rash 09/05/2013  . Sulfamethoxazole Nausea Only and Rash 12/09/2011  . Sulfasalazine Nausea Only and Rash 01/24/2014    Vitals: BP (!) 107/59   Pulse 62   Ht 5\' 10"  (1.778 m)   Wt 193 lb (87.5 kg)   BMI 27.69 kg/m  Last Weight:  Wt Readings from Last 1 Encounters:  10/11/17 193 lb (87.5 kg)   XHB:ZJIR mass index is 27.69 kg/m.     Last Height:   Ht Readings from Last 1 Encounters:  10/11/17 5\' 10"  (1.778 m)    Physical exam:  General: The patient is awake, alert and appears not in acute distress. The patient is well groomed. Head: Normocephalic, atraumatic. Neck is supple. Mallampati 2 with a tongue and uvula moving in midline, the uvula lifts above the tongue ground,  neck circumference: 15.5  Nasal airflow patent, Retrognathia is not seen.  Cardiovascular:  Regular rate and rhythm , without  murmurs or carotid bruit, and without distended neck veins. Respiratory: Lungs are clear to auscultation. Skin:  Without evidence of edema, or rash Trunk: BMI is 27.69.    Neurologic exam : The patient is awake and  alert, but reports  being challenged " orientation  to place and time" .   MOCA:No flowsheet data found.   MMSE: MMSE - Mini Mental State Exam 10/11/2017  Orientation to time 1  Orientation to Place 4  Registration 3  Attention/ Calculation 5  Attention/Calculation-comments couldnt do the numbers  Recall 0  Language- name 2 objects 2  Language- repeat 1  Language- follow 3 step command 3  Language- read & follow direction 1  Write a sentence 1  Copy design 0  Total score 21     Attention span & concentration ability appears limited. The patient looks towards his wife for answers.  Speech is fluent, without dysarthria, dysphonia or aphasia.  Mood and affect are appropriate.  Cranial nerves: Pupils are equal and briskly reactive to light. Funduscopic exam without evidence of pallor or edema. Extraocular movements  in vertical and horizontal planes intact and without nystagmus. Visual fields by finger perimetry are intact. Hearing to finger rub intact- with hearing aids in place.  Facial sensation intact to fine touch. Facial motor strength is symmetric and tongue and uvula move midline. Shoulder shrug was symmetrical.   Motor exam:   Normal tone, muscle bulk and symmetric strength in all extremities. Sensory:  Fine touch, pinprick and vibration were tested in all extremities. Proprioception tested in the upper extremities was normal. Coordination: Rapid alternating movements in the fingers/hands was normal. Finger-to-nose maneuver  normal without evidence of ataxia, dysmetria or tremor. Gait and station: Patient walks without assistive device .Tandem gait is unfragmented. Turns with 3  Steps. Romberg testing is negative. Deep tendon reflexes: in the  upper and lower extremities are symmetric and intact. Babinski  maneuver response is downgoing.   Assessment:  After physical and neurologic examination, review of laboratory studies,  Personal review of imaging studies, reports of other  /same  Imaging studies, results of polysomnography and / or neurophysiology testing and pre-existing records as far as provided in visit., my assessment is   MCI or early dementia - followed by PCP. Just started Vyacog.    1) EDS:  Mr. Swamy reports that he feels more sleepy now in daytime, easily dozes off when physically not active and not stimulated in other ways.  However he does not report sleep attacks, he does not feel that he is likely to fall asleep in a conversation or in a situation where it is crucial to be alert.  I do not have records available from 2008 or earlier unfortunately these have fallen victim to our computerization.   It seems that he has not presented with snoring or any witnessed apneas, and is doing well and on oxygen concentrator at night.  Dr. Sharlett Iles asked me to review if he still has apnea and I will be happy to order a sleep study, but the patient just told me that he had an overnight pulse oximetry and that these results are still pending.this ONO was performed on oxygen.  What I will suggest is that we order a SPLIT .  His oxygen concentrator is over 43 years old- and I can only prescribe oxygen if titrated in an attended sleep study and if he failed CPAP.  If he is not willing to come in for a SPLIT night study - I can only refer him to Pulmonology.   The patient was advised of the nature of the diagnosed disorder , the treatment options and the  risks for general health and wellness arising from not treating the condition.   I spent more than 45 minutes of face to face time with the patient.  Greater than 50% of time was spent in counseling and coordination of care. We have discussed the diagnosis and differential and I answered the patient's questions.    Plan:  Treatment plan and additional workup :  Mrs. Ingle is for a new SPLIT night evaluation and I will order a SPLIT.   I would refer to Pulmonology for oxygen needs, if OK with Dr Philip Aspen.   Dr  Arn Medal, etc.     Anastasia Tompson, MD 1/74/9449, 6:75 AM  Certified in Neurology by ABPN Certified in Sleep Medicine by [facility] [address] [place], [place] [address]

## 2017-10-19 DIAGNOSIS — R350 Frequency of micturition: Secondary | ICD-10-CM | POA: Diagnosis not present

## 2017-10-19 DIAGNOSIS — N400 Enlarged prostate without lower urinary tract symptoms: Secondary | ICD-10-CM | POA: Diagnosis not present

## 2017-10-19 DIAGNOSIS — N35011 Post-traumatic bulbous urethral stricture: Secondary | ICD-10-CM | POA: Diagnosis not present

## 2017-11-13 ENCOUNTER — Ambulatory Visit (INDEPENDENT_AMBULATORY_CARE_PROVIDER_SITE_OTHER): Payer: Medicare Other | Admitting: Neurology

## 2017-11-13 DIAGNOSIS — G4733 Obstructive sleep apnea (adult) (pediatric): Secondary | ICD-10-CM

## 2017-11-13 DIAGNOSIS — G3184 Mild cognitive impairment, so stated: Secondary | ICD-10-CM

## 2017-11-21 NOTE — Procedures (Signed)
PATIENT'S NAMEMort, [name] DOB:      10-Mar-1941      MR#:    [id number]     DATE OF RECORDING: 11/13/2017 REFERRING M.D.:  Leanna Battles, M.D. Study Performed:   Baseline Polysomnogram HISTORY:  The patient is a 77 year old Male and had in 2008 been evaluated for OSA, he was using an oxygen concentrator at the time and was issued a CPAP machine in addition after PSG. He could not get comfortable with CPAP, had air leaks, and aerophobia. Oxygen was finally prescribed alone - a compromise to maintain SpO2. During the last visit with his primary care physician, Dr. Leanna Battles, the patient had also been tested for short-term memory concerns.  Reported that he could no longer remember some people he had known for many years by their names.  He is also easily distracted, sometimes forgets what day of the week it is.  In August 2018 pravastatin was discontinued and he started DDAVP to reduce nocturia and improve sleep quality. He has also used gabapentin 100 mg at bedtime which has helped with sleep.   In October 2018 his urologist confirmed that she seemed to be doing well.  His Cardiologist felt he was stable with no changes to EKG in 07/2017.  The patient endorsed the Epworth Sleepiness Scale at 11/24 points, FSS at 13, and MMSE at 21/30 points.   The patient's weight 193 pounds with a height of 60 (inches), resulting in a BMI of 38.1 kg/m2. The patient's neck circumference measured 15.5 inches.  CURRENT MEDICATIONS: Acetaminophen, Aspirin, Calcium Vitamin-D, Coenzyme, Desmopressin, Gabapentin, Meclizine, Namenda, Multi-Vitamin, Omega-3, Pravastatin, Ranitidine and Vayacog.   PROCEDURE:  This is a multichannel digital polysomnogram utilizing the SomnoStar 11.2 system.  Electrodes and sensors were applied and monitored per AASM Specifications.   EEG, EOG, Chin and Limb EMG, were sampled at 200 Hz.  ECG, Snore and Nasal Pressure, Thermal Airflow, Respiratory Effort, CPAP Flow and Pressure,  Oximetry was sampled at 50 Hz. Digital video and audio were recorded.      BASELINE STUDY Lights Out was at 22:29 and Lights On at 05:01.  Total recording time (TRT) was 392.5 minutes, with a total sleep time (TST) of 331 minutes.   The patient's sleep latency was 24 minutes.  REM latency was 112 minutes.  The sleep efficiency was 84.3 %.     SLEEP ARCHITECTURE: WASO (Wake after sleep onset) was 54.5 minutes.  There were 13 minutes in Stage N1, 254 minutes Stage N2, 0 minutes Stage N3 and 64 minutes in Stage REM.  The percentage of Stage N1 was 3.9%, Stage N2 was 76.7%, Stage N3 was 0% and Stage R (REM sleep) was 19.3%.   RESPIRATORY ANALYSIS:  There were a total of 81 respiratory events:  6 obstructive apneas, 0 central apneas and 75 hypopneas with 0 respiratory event related arousals (RERAs). The total APNEA/HYPOPNEA INDEX (AHI) was 14.7/hour and the total RESPIRATORY DISTURBANCE INDEX was 14.7 /hour.  17 events occurred in REM sleep and 116 events in NREM. The REM AHI was 15.9 /hour, versus a non-REM AHI of 14.4. The patient spent 280 minutes of total sleep time in the supine position and 51 minutes in non-supine. The supine AHI was 16.1 versus a non-supine AHI of 7.1.  OXYGEN SATURATION & C02:  The Wake baseline 02 saturation was 94%, with the lowest being 73%. Time spent below 89% saturation equaled 14 minutes. The patient did not use oxygen.   PERIODIC LIMB  MOVEMENTS:  The patient had a total of 168 Periodic Limb Movements.  The Periodic Limb Movement (PLM) index was 30.5 and the PLM Arousal index was 2.7/hour. The arousals were noted as: 23 were spontaneous, 15 were associated with PLMs, and 82 were associated with respiratory events. Audio and video analysis did not show any abnormal or unusual movements, behaviors, phonations or vocalizations.   The patient took one bathroom breaks. Some Snoring was noted. EKG was regular. Post-study, the patient indicated that sleep was better than usual.     IMPRESSION:  1. Mild Obstructive Sleep Apnea/ hypopnea syndrome (OSA) at [facility] of 14/h, and only moderately accentuated in REM sleep and supine sleep.  2. Periodic Limb Movement Disorder (PLMD).  No hypoxemia noted- neither oxygen not CPAP were implemented during this PSG.  1. There were frequent periodic limb movements of sleep (PLMS) with associated sleep disruption.  Consider treating the PLMS primarily. Avoid caffeine-containing beverages and chocolate  2. Positional therapy is advised. Neither oxygen not CPAP use are indicated.  3. Correlate clinically for a history consistent with regarding restless legs syndrome (RLS).    Consider secondary restless legs syndrome.  Pharmacotherapy may be warranted.  Obtain a serum ferritin level if the clinical history is consistent with RLS.  Consider iron therapy and evaluation for iron deficiency anemia if the serum ferritin level < 50 ng/ml.   4. A follow up appointment will be scheduled in the Sleep Clinic at [facility]. The referring provider will be notified of the results.      I certify that I have reviewed the entire raw data recording prior to the issuance of this report in accordance with the Standards of Accreditation of the [employer] [employer] (AASM)     Larey Seat, MD      11-21-2017  Diplomat, American Board of Psychiatry and Neurology  Diplomat, American Board of [place] Director, [employer] [facility] [employer]

## 2017-11-22 ENCOUNTER — Telehealth: Payer: Self-pay | Admitting: Neurology

## 2017-11-22 NOTE — Telephone Encounter (Signed)
Called and went over the results with the patient's wife. I informed the wife that his apnea could be treated by sleeping on his side vs on his back. I informed of ways that could help him do that. I informed her that Dr Brett Fairy did notice the patient has periodic limb movements and would like to follow up to discuss the treatment plan to help with this. I was able to offer an apt for tomorrow  On 3/28 at 0930 with a check in of 9 am. Patients wife agreed to this apt and verbalized understanding.

## 2017-11-22 NOTE — Telephone Encounter (Signed)
-----   Message from Larey Seat, MD sent at 11/21/2017  5:51 PM EDT ----- OSA is mild and not associated with low oxygen periods, can be treated by avoiding supine sleep position. Non supine AHI  is 7/h.  No O2 supplement is needed.  The patient has fairly severe PLM disorder and related arousals. Evaluate the PLM  for overlap with RLS , and differentiate form spinal stenosis, PAD, Neuropathy and claudication.  Rc with me or Np for differential diagnosis work up CD

## 2017-11-23 ENCOUNTER — Encounter: Payer: Self-pay | Admitting: Neurology

## 2017-11-23 ENCOUNTER — Ambulatory Visit (INDEPENDENT_AMBULATORY_CARE_PROVIDER_SITE_OTHER): Payer: Medicare Other | Admitting: Neurology

## 2017-11-23 VITALS — BP 113/56 | HR 60 | Ht 70.0 in | Wt 195.0 lb

## 2017-11-23 DIAGNOSIS — G4733 Obstructive sleep apnea (adult) (pediatric): Secondary | ICD-10-CM

## 2017-11-23 DIAGNOSIS — R413 Other amnesia: Secondary | ICD-10-CM | POA: Diagnosis not present

## 2017-11-23 NOTE — Patient Instructions (Signed)
MEMORY LOSS  means losing some of your brain ability. People with memory loss may have dementia, may have problems with:  Memory.  Making decisions.  Behavior.  Speaking.  Thinking.  Solving problems.  Follow these instructions at home: Medicine  Take over-the-counter and prescription medicines only as told by your doctor.  Avoid taking medicines that can change how you think. These include pain or sleeping medicines. Lifestyle   Make healthy choices: ? Be active as told by your doctor. ? Do not use any tobacco products, such as cigarettes, chewing tobacco, and e-cigarettes. If you need help quitting, ask your doctor. ? Eat a healthy diet. ? When you get stressed, do something to help yourself relax. Your doctor can give you tips. ? Spend time with other people.  Drink enough fluid to keep your pee (urine) clear or pale yellow.  Make sure you get good sleep. Use these tips to help you get a good night's rest: ? Try not to take naps during the day. ? Keep your sleeping area dark and cool. ? In the few hours before you go to bed, try not to do any exercise. ? Try not to have foods and drinks with caffeine in the evening. General instructions  Talk with your doctor to figure out: ? What you need help with. ? What your safety needs are.  If you were given a bracelet that tracks your location, make sure to wear it.  Keep all follow-up visits as told by your doctor. This is important. Contact a doctor if:  You have any new problems.  You have problems with choking or swallowing.  You have any symptoms of a different sickness. Get help right away if:  You have a fever.  You feel mixed up (confused) or more mixed up than before.  You have new sleepiness.  You have sleepiness that gets worse.  You have a hard time staying awake.  You or your family members are worried for your safety. This information is not intended to replace advice given to you by your  health care provider. Make sure you discuss any questions you have with your health care provider. Document Released: 07/28/2008 Document Revised: 01/21/2016 Document Reviewed: 05/13/2015 Elsevier Interactive Patient Education  [employer].

## 2017-11-23 NOTE — Progress Notes (Signed)
SLEEP MEDICINE CLINIC   Provider:  Larey Seat, M D  Primary Care Physician:  Donald Battles, MD   Referring Provider: Leanna Battles, MD   Chief Complaint  Patient presents with  . Follow-up    pt here to discuss sleep study results and treatment plan    HPI:  [name] is a 77 y.o. male , seen here  in a referral   from [name] for evaluation of sleep medicine needs.   The patient has been evaluated for OSA last in 2008 and was using a oxygen concentrator, originally seen by me in the same year he was issued a CPAP machine. He could not get comfortable with CPAP, had air leaks, and aerophagia. Oxygen was finally prescribed alone - a compromise to maintain SpO2 . During the last visit with his primary care physician, [name], the patient had also been tested for short-term memory concerns.  Reported that some people he had known for many years he could no longer remember by the names.  He is also easily distracted, sometimes forgets what day of the week it is.  He also discussed via cog.  In an annual physical examination in August 2018 pravastatin was discontinued and he started DDAVP  to reduce nocturia and improve sleep quality.  October 2018 his urologist confirmed that she seemed to be doing well.  He has also used gabapentin 100 mg at bedtime which has helped with sleep.  Cardiologist last visited in December 2018 felt he was stable with no changes to EKG.  Chief complaint according to patient :  "I have no sleep issues"   Sleep habits are as follows:  He drinks a glass of tea at dinner, sometimes cola. The patient reports that sometimes he will dose off before his wife. He goes to bed by 9.30 and reads in bed.  He is using an oxygen concentrator at 2 L with a nasal cannula.  His nocturia has been reduced to once at night after he started DDAVP.  His wife does not report that he snores, she has not witnessed any apneas.  He seems to sleep well and  through the night until 6.30 when he rises, he wakes spontaneously. The couple exercises twice a week in AM with a trainer.  Drinks coffee in AM, feels refreshed most of the time, no dry mouth, dizziness, nausea, palpitations or headaches.  During the  daytime he  falls asleep easily and not physically active or mentally stimulated. He may stay asleep for about 30 minutes.   Sleep medical history and family sleep history: An adult son suffered a stroke and is currently living with his family again, there are no other sleep disorders or history of sleep disorders known among the blood relations of [name]- history of tonsillectomy, neck surgery, traumatic brain injury in the patient. No sleep walking, enuresis or night terrors.   Social history: adult son suffered a stroke and now lives with parents, again. Another adult son lives in [place], 3 grandchildren. Non smoker. ETOH 2 times / month.   Review of Systems: Out of a complete 14 system review, the patient complains of only the following symptoms, and all other reviewed systems are negative.  Patient is here mainly for a reevaluation if he has sleep apnea or not.  [name] also is concerned that possible low oxygen levels at night or an undetected sleep disorder may contribute to the patient's recent cognitive challenges.  He reports  memory loss, some confusion, some anxiety, cough and hearing loss.  He is status post TURP transurethral resection of the prostate.  Reviewed his medications today, 11-23-2017 Epworth score 11 , Fatigue severity score 13  , depression score 4/15 .   I have the pleasure of seeing Mr. [name] today for a revisit on 23 November 2017.  He is here in the presence of his wife, the patient underwent a polysomnography study on 13 November 2017.  He was diagnosed with a rather mild apnea at an AHI of 14.7 which exacerbated in supine sleep position to an apnea hypopnea index of 16.1/h of sleep.  During REM sleep the AHI was also  higher at 15.9/h.  Only 14 minutes of total desaturation time were seen indicating that the patient does not need oxygen.  There were frequent periodic limb movements and arousals related to limb movements of 2.7 /h.  He assured me that he sleeps at night on his side, confirmed by his wife. He wants to keep his oxygen concentrator - likes it and feels it helps. He is due for a new certification in July stated his wife. He is very disappointed that he wouldn't qualify for a new concentrator.     Social History   Socioeconomic History  . Marital status: Married    Spouse name: Not on file  . Number of children: Not on file  . Years of education: Not on file  . Highest education level: Not on file  Occupational History  . Not on file  Social Needs  . Financial resource strain: Not on file  . Food insecurity:    Worry: Not on file    Inability: Not on file  . Transportation needs:    Medical: Not on file    Non-medical: Not on file  Tobacco Use  . Smoking status: Never Smoker  . Smokeless tobacco: Never Used  Substance and Sexual Activity  . Alcohol use: Yes    Comment: Once per month  . Drug use: No  . Sexual activity: Not on file  Lifestyle  . Physical activity:    Days per week: Not on file    Minutes per session: Not on file  . Stress: Not on file  Relationships  . Social connections:    Talks on phone: Not on file    Gets together: Not on file    Attends religious service: Not on file    Active member of club or organization: Not on file    Attends meetings of clubs or organizations: Not on file    Relationship status: Not on file  . Intimate partner violence:    Fear of current or ex partner: Not on file    Emotionally abused: Not on file    Physically abused: Not on file    Forced sexual activity: Not on file  Other Topics Concern  . Not on file  Social History Narrative  . Not on file    Family History  Problem Relation Age of Onset  . Other Mother         Died at 108 - "old age"  . Stroke Father        Died at 27  . Tuberculosis Paternal Grandfather   . CAD Neg Hx     Past Medical History:  Diagnosis Date  . Acid reflux    a. well-controlled with ranitidine.  Marland Kitchen BPH (benign prostatic hyperplasia)    a. s/p TURP 2012.  . Essential  hypertension 09/10/2013  . Eye problem    a. L eye blurred vision - seeing eye doctor for tissue pulling away from eye.  . Hard of hearing   . Hiatal hernia    a. Small-mod by CXR 2015.  Marland Kitchen High cholesterol     Past Surgical History:  Procedure Laterality Date  . PARS PLANA VITRECTOMY Left 2015   WITH MEMBRANE PEEL, 23 GAUGE  . TRANSURETHRAL RESECTION OF PROSTATE    . Urethral stricture surgery      Current Outpatient Medications  Medication Sig Dispense Refill  . acetaminophen (TYLENOL) 500 MG tablet Take 1,000 mg by mouth every 6 (six) hours as needed for mild pain or moderate pain.    Marland Kitchen aspirin 81 MG tablet Take 81 mg by mouth daily.    . Calcium Carbonate-Vitamin D (CALTRATE 600+D PO) Take 1 tablet by mouth daily.    Marland Kitchen COENZYME Q-10 PO Take 1 tablet by mouth daily.    Marland Kitchen desmopressin (DDAVP) 0.2 MG tablet   0  . gabapentin (NEURONTIN) 100 MG capsule TAKE 1 CAPSULE BY MOUTH EVERYDAY AT BEDTIME  1  . meclizine (ANTIVERT) 25 MG tablet Take 25 mg by mouth daily.    . memantine (NAMENDA) 5 MG tablet Take 1 tablet by mouth 2 (two) times daily.    . Multiple Vitamins-Minerals (CENTRUM SILVER PO) Take 1 tablet by mouth daily.    . Omega-3 Fatty Acids (FISH OIL) 1000 MG CAPS Take 1 capsule (1,000 mg total) by mouth daily.  0  . Polyethyl Glycol-Propyl Glycol (SYSTANE OP) Apply 2 drops to eye daily as needed (Dry eyes). 2 drops each eye    . pravastatin (PRAVACHOL) 20 MG tablet Take 20 mg by mouth daily.  12  . ranitidine (ZANTAC) 150 MG capsule Take 150 mg by mouth 2 (two) times daily.    . ranitidine (ZANTAC) 150 MG tablet Take 150 mg by mouth 2 (two) times daily.  11  . VAYACOG 100-19.5-6.5 MG CAPS Take  1 capsule by mouth daily.  3   No current facility-administered medications for this visit.     Allergies as of 11/23/2017 - Review Complete 11/23/2017  Allergen Reaction Noted  . Sulfa antibiotics Nausea Only and Rash 09/05/2013  . Sulfamethoxazole Nausea Only and Rash 12/09/2011  . Sulfasalazine Nausea Only and Rash 01/24/2014    Vitals: BP (!) 113/56   Pulse 60   Ht 5\' 10"  (1.778 m)   Wt 195 lb (88.5 kg)   BMI 27.98 kg/m  Last Weight:  Wt Readings from Last 1 Encounters:  11/23/17 195 lb (88.5 kg)   SWN:IOEV mass index is 27.98 kg/m.     Last Height:   Ht Readings from Last 1 Encounters:  11/23/17 5\' 10"  (1.778 m)    Physical exam:  General: The patient is awake, alert and appears not in acute distress. The patient is well groomed. Head: Normocephalic, atraumatic. Neck is supple. Mallampati 2 with a tongue and uvula moving in midline, the uvula lifts above the tongue ground,  neck circumference: 15.5  Nasal airflow patent, Retrognathia is not seen.  Cardiovascular:  Regular rate and rhythm , without  murmurs or carotid bruit, and without distended neck veins. Respiratory: Lungs are clear to auscultation. Skin:  Without evidence of edema, or rash Trunk: BMI is 27.69.    Neurologic exam : The patient is awake and alert, but reports  being challenged " orientation  to place and time" .   MOCA:No flowsheet data  found.   MMSE: MMSE - Mini Mental State Exam 10/11/2017  Orientation to time 1  Orientation to Place 4  Registration 3  Attention/ Calculation 5  Attention/Calculation-comments couldnt do the numbers  Recall 0  Language- name 2 objects 2  Language- repeat 1  Language- follow 3 step command 3  Language- read & follow direction 1  Write a sentence 1  Copy design 0  Total score 21     Attention span & concentration ability appears limited. The patient looks towards his wife for answers.  Speech is fluent, without dysarthria, dysphonia or aphasia.  Mood  and affect are a little agitated   Cranial nerves: Pupils are equal and briskly reactive to light. Funduscopic exam without evidence of pallor or edema. Extraocular movements  in vertical and horizontal planes intact and without nystagmus. Visual fields by finger perimetry are intact. Hearing to finger rub intact- with hearing aids in place.  Facial sensation intact to fine touch. Facial motor strength is symmetric and tongue and uvula move midline. Shoulder shrug was symmetrical.   Motor exam:   Normal tone, muscle bulk and symmetric strength in all extremities. Sensory:  Fine touch, pinprick and vibration were tested in all extremities. Proprioception tested in the upper extremities was normal. Coordination: Rapid alternating movements in the fingers/hands was normal. Finger-to-nose maneuver  normal without evidence of ataxia, dysmetria or tremor. Gait and station: Patient walks without assistive device .Tandem gait is unfragmented. Turns with 3  Steps. Romberg testing is negative. Deep tendon reflexes: in the  upper and lower extremities are symmetric and intact. Babinski maneuver response is downgoing.   Assessment:  After physical and neurologic examination, review of laboratory studies,  Personal review of imaging studies, reports of other /same  Imaging studies, results of polysomnography and / or neurophysiology testing and pre-existing records as far as provided in visit., my assessment is   MCI or early dementia - followed by PCP. Just started Vyacog.    1) EDS:  Mr. Serio reports that he feels more sleepy now in daytime, easily dozes off when physically not active and not stimulated in other ways.  However he does not report sleep attacks, he does not feel that he is likely to fall asleep in a conversation or in a situation where it is crucial to be alert.  I do not have records available from 2008 or earlier unfortunately these have fallen victim to our computerization.   It seems that  he has not presented with snoring or any witnessed apneas, and is doing well and on oxygen concentrator at night.  Dr. Sharlett Iles asked me to review if he still has apnea and I will be happy to order a sleep study, but the patient just told me that he had an overnight pulse oximetry and that these results are still pending.this ONO was performed on oxygen.  What I will suggest is that we order a SPLIT .  His oxygen concentrator is over 45 years old- and I can only prescribe oxygen if titrated in an attended sleep study and if he failed CPAP.  If he is not willing to come in for a SPLIT night study - I can only refer him to Pulmonology.   The patient was advised of the nature of the diagnosed disorder , the treatment options and the  risks for general health and wellness arising from not treating the condition.   I spent more than 45 minutes of face to face time with the  patient.  Greater than 50% of time was spent in counseling and coordination of care. We have discussed the diagnosis and differential and I answered the patient's questions.    Plan:  Treatment plan and additional workup :  Mrs. Rosenberry is for a new SPLIT night evaluation and I will order a SPLIT.   I would refer to Pulmonology for oxygen needs, if OK with Dr [name].   Dr Arn Medal, etc.     Amy Gothard, MD 12/12/6061, 0:16 AM  Certified in Neurology by ABPN Certified in Sleep Medicine by [facility] [address] [place], [place] [address]           SLEEP MEDICINE CLINIC   Provider:  Larey Seat, [place] D  Primary Care Physician:  Donald Battles, MD   Referring Provider: Leanna Battles, MD   Chief Complaint  Patient presents with  . Follow-up    pt here to discuss sleep study results and treatment plan    HPI:  [name] is a 77 y.o. male , seen here  in a referral   from [name] for evaluation of sleep medicine needs.  The patient has been evaluated  for OSA last in 2008 and was using a oxygen concentrator, originally seen by me in the same year he was issued a CPAP machine. He could not get comfortable with CPAP, had air leaks, and aerophagia. Oxygen was finally prescribed alone - a compromise to maintain SpO2 .  During the last visit with his primary care physician, Dr. Juliette Mangle, the patient had also been tested for short-term memory concerns.  Reported that some people he had known for many years he could no longer remember by the names.  He is also easily distracted, sometimes forgets what day of the week it is.  He also discussed via cog.  In an annual physical examination in August 2018 pravastatin was discontinued and he started DDAVP  to reduce nocturia and improve sleep quality.  October 2018 his urologist confirmed that she seemed to be doing well.  He has also used gabapentin 100 mg at bedtime which has helped with sleep.  Cardiologist last visited in December 2018 felt he was stable with no changes to EKG.  Chief complaint according to patient :  "I have no sleep issues"   Sleep habits are as follows:  He drinks a glass of tea at dinner, sometimes cola. The patient reports that sometimes he will dose off before his wife. He goes to bed by 9.30 and reads in bed.  He is using an oxygen concentrator at 2 L with a nasal cannula.  His nocturia has been reduced to once at night after he started DDAVP.  His wife does not report that he snores, she has not witnessed any apneas.  He seems to sleep well and through the night until 6.30 when he rises, he wakes spontaneously. The couple exercises twice a week in AM with a trainer.  Drinks coffee in AM, feels refreshed most of the time, no dry mouth, dizziness, nausea, palpitations or headaches.  During the  daytime he  falls asleep easily and not physically active or mentally stimulated. He may stay asleep for about 30 minutes.   Sleep medical history and family sleep history: An adult son  suffered a stroke and is currently living with his family again, there are no other sleep disorders or history of sleep disorders known among the blood relations of  Mr. Nesheim.- history of tonsillectomy, neck surgery, traumatic brain injury in the patient. No sleep walking, enuresis or night terrors.    Social history: adult son suffered a stroke and now lives with parents, again. Another adult son lives in [place], 3 grandchildren. Non smoker. ETOH 2 times / month.   Review of Systems: Out of a complete 14 system review, the patient complains of only the following symptoms, and all other reviewed systems are negative.  Patient is here mainly for a reevaluation if he has sleep apnea or not.  Dr. Sharlett Iles also is concerned that possible low oxygen levels at night or an undetected sleep disorder may contribute to the patient's recent cognitive challenges.   He reports memory loss, some confusion, some anxiety, cough and hearing loss.   He is status post TURP transurethral resection of the prostate.  Reviewed his medications.  Epworth score 16 from 11 , Fatigue severity score 13  , depression score 4/15    Social History   Socioeconomic History  . Marital status: Married    Spouse name: Not on file  . Number of children: Not on file  . Years of education: Not on file  . Highest education level: Not on file  Occupational History  . Not on file  Social Needs  . Financial resource strain: Not on file  . Food insecurity:    Worry: Not on file    Inability: Not on file  . Transportation needs:    Medical: Not on file    Non-medical: Not on file  Tobacco Use  . Smoking status: Never Smoker  . Smokeless tobacco: Never Used  Substance and Sexual Activity  . Alcohol use: Yes    Comment: Once per month  . Drug use: No  . Sexual activity: Not on file  Lifestyle  . Physical activity:    Days per week: Not on file    Minutes per session: Not on file  . Stress: Not on file  Relationships  .  Social connections:    Talks on phone: Not on file    Gets together: Not on file    Attends religious service: Not on file    Active member of club or organization: Not on file    Attends meetings of clubs or organizations: Not on file    Relationship status: Not on file  . Intimate partner violence:    Fear of current or ex partner: Not on file    Emotionally abused: Not on file    Physically abused: Not on file    Forced sexual activity: Not on file  Other Topics Concern  . Not on file  Social History Narrative  . Not on file    Family History  Problem Relation Age of Onset  . Other Mother        Died at 40 - "old age"  . Stroke Father        Died at 50  . Tuberculosis Paternal Grandfather   . CAD Neg Hx     Past Medical History:  Diagnosis Date  . Acid reflux    a. well-controlled with ranitidine.  Marland Kitchen BPH (benign prostatic hyperplasia)    a. s/p TURP 2012.  . Essential hypertension 09/10/2013  . Eye problem    a. L eye blurred vision - seeing eye doctor for tissue pulling away from eye.  . Hard of hearing   . Hiatal hernia    a. Small-mod by CXR 2015.  Marland Kitchen High  cholesterol     Past Surgical History:  Procedure Laterality Date  . PARS PLANA VITRECTOMY Left 2015   WITH MEMBRANE PEEL, 23 GAUGE  . TRANSURETHRAL RESECTION OF PROSTATE    . Urethral stricture surgery      Current Outpatient Medications  Medication Sig Dispense Refill  . acetaminophen (TYLENOL) 500 MG tablet Take 1,000 mg by mouth every 6 (six) hours as needed for mild pain or moderate pain.    Marland Kitchen aspirin 81 MG tablet Take 81 mg by mouth daily.    . Calcium Carbonate-Vitamin D (CALTRATE 600+D PO) Take 1 tablet by mouth daily.    Marland Kitchen COENZYME Q-10 PO Take 1 tablet by mouth daily.    Marland Kitchen desmopressin (DDAVP) 0.2 MG tablet   0  . gabapentin (NEURONTIN) 100 MG capsule TAKE 1 CAPSULE BY MOUTH EVERYDAY AT BEDTIME  1  . meclizine (ANTIVERT) 25 MG tablet Take 25 mg by mouth daily.    . memantine (NAMENDA) 5 MG  tablet Take 1 tablet by mouth 2 (two) times daily.    . Multiple Vitamins-Minerals (CENTRUM SILVER PO) Take 1 tablet by mouth daily.    . Omega-3 Fatty Acids (FISH OIL) 1000 MG CAPS Take 1 capsule (1,000 mg total) by mouth daily.  0  . Polyethyl Glycol-Propyl Glycol (SYSTANE OP) Apply 2 drops to eye daily as needed (Dry eyes). 2 drops each eye    . pravastatin (PRAVACHOL) 20 MG tablet Take 20 mg by mouth daily.  12  . ranitidine (ZANTAC) 150 MG capsule Take 150 mg by mouth 2 (two) times daily.    . ranitidine (ZANTAC) 150 MG tablet Take 150 mg by mouth 2 (two) times daily.  11  . VAYACOG 100-19.5-6.5 MG CAPS Take 1 capsule by mouth daily.  3   No current facility-administered medications for this visit.     Allergies as of 11/23/2017 - Review Complete 11/23/2017  Allergen Reaction Noted  . Sulfa antibiotics Nausea Only and Rash 09/05/2013  . Sulfamethoxazole Nausea Only and Rash 12/09/2011  . Sulfasalazine Nausea Only and Rash 01/24/2014    Vitals: BP (!) 113/56   Pulse 60   Ht 5\' 10"  (1.778 m)   Wt 195 lb (88.5 kg)   BMI 27.98 kg/m  Last Weight:  Wt Readings from Last 1 Encounters:  11/23/17 195 lb (88.5 kg)   CBJ:SEGB mass index is 27.98 kg/m.     Last Height:   Ht Readings from Last 1 Encounters:  11/23/17 5\' 10"  (1.778 m)    Physical exam:  General: The patient is awake, alert and appears not in acute distress. The patient is well groomed. Head: Normocephalic, atraumatic. Neck is supple. Mallampati 2 with a tongue and uvula moving in midline, the uvula lifts above the tongue ground,  neck circumference: 15.5  Nasal airflow patent, Retrognathia is not seen.  Cardiovascular:  Regular rate and rhythm , without  murmurs or carotid bruit, and without distended neck veins. Respiratory: Lungs are clear to auscultation. Skin:  Without evidence of edema, or rash Trunk: BMI is 27.69.    Neurologic exam : The patient is awake and alert, but reports  being challenged "  orientation  to place and time" .   MOCA:No flowsheet data found.   MMSE: MMSE - Mini Mental State Exam 10/11/2017  Orientation to time 1  Orientation to Place 4  Registration 3  Attention/ Calculation 5  Attention/Calculation-comments couldnt do the numbers  Recall 0  Language- name 2 objects 2  Language- repeat 1  Language- follow 3 step command 3  Language- read & follow direction 1  Write a sentence 1  Copy design 0  Total score 21     Attention span & concentration ability appears limited. The patient looks towards his wife for answers.  Speech is fluent, without dysarthria, dysphonia or aphasia.  Mood and affect are appropriate.  Cranial nerves: Pupils are equal and briskly reactive to light. Funduscopic exam without evidence of pallor or edema. Extraocular movements  in vertical and horizontal planes intact and without nystagmus. Visual fields by finger perimetry are intact. Hearing to finger rub intact- with hearing aids in place.  Facial sensation intact to fine touch. Facial motor strength is symmetric and tongue and uvula move midline. Shoulder shrug was symmetrical.   Motor exam:   Normal tone, muscle bulk and symmetric strength in all extremities. Sensory:  Fine touch, pinprick and vibration were tested in all extremities. Proprioception tested in the upper extremities was normal. Coordination: Rapid alternating movements in the fingers/hands was normal. Finger-to-nose maneuver  normal without evidence of ataxia, dysmetria or tremor. Gait and station: Patient walks without assistive device .Tandem gait is unfragmented. Turns with 3  Steps. Romberg testing is negative. Deep tendon reflexes: in the  upper and lower extremities are symmetric and intact. Babinski maneuver response is downgoing.   Assessment:  After physical and neurologic examination, review of laboratory studies,  Personal review of imaging studies, reports of other /same  Imaging studies, results of  polysomnography and / or neurophysiology testing and pre-existing records as far as provided in visit., my assessment is   MCI or early dementia - followed by PCP. Just started Vyacog.  He is easily irritated, no happy about the ' no need for oxygen " verdict. Very concerned about cognitive:    1) EDS:  Mr. Bobrowski reports that he feels more sleepy now in daytime, easily dozes off when physically not active and not stimulated in other ways.  However he does not report sleep attacks, he has mild Apnea-  He agreed to rather try CPAP - but is unhappy about possibly losing his oxygen concentrator.   I explained we can alternativly  try for pulmonary functions tests , that could help him to qualify for oxygen.  An oxygen concentrator could be purchased for less than $3000 currently, for a n that may not be anything to ew one.  It seems that he has not presented with snoring or any witnessed apneas, and is doing well and on oxygen concentrator at night.   [name] asked me to review if he still has apnea - he has very mild one- I can only recommend a walking test with Pulmonology.  I will order and autotitrator for the patient a CPAP between 5 and 10 cmH2O, without EPR, and was I am asked to his comfort.  I asked to give him a 60-90-day trial on CPAP, if he tolerates CPAP well and feels well in the morning I would like to keep him on it, but I do not want him to be penalized in any way if his mild apnea does not respond to CPAP or he cannot tolerate the therapy.  The patient was advised of the nature of the diagnosed disorder , the treatment options and the  risks for general health and wellness arising from not treating the condition.   I spent more than 25 minutes of face to face time with the patient.  Greater than 50%  of time was spent in counseling and coordination of care. We have discussed the diagnosis and differential and I answered the patient's questions.    Plan:  Treatment plan and  additional workup :  No o2 needs after review of PSG, mmild apnea- he agrees to use CPAP for a trial period.   MCI/ early dementia. Has stopped Vyacog after 4 years . I would like to enroll him into an early dementia trial.   I would defer to Pulmonology for oxygen needs, if OK with Dr [name].      Larey Seat, MD 1/84/0375, 4:36 AM  Certified in Neurology by ABPN Certified in [place] by [facility] [address] [place], [place] [address]

## 2017-11-24 DIAGNOSIS — G2581 Restless legs syndrome: Secondary | ICD-10-CM | POA: Diagnosis not present

## 2018-01-05 DIAGNOSIS — N35011 Post-traumatic bulbous urethral stricture: Secondary | ICD-10-CM | POA: Diagnosis not present

## 2018-01-05 DIAGNOSIS — N400 Enlarged prostate without lower urinary tract symptoms: Secondary | ICD-10-CM | POA: Diagnosis not present

## 2018-01-15 DIAGNOSIS — C44329 Squamous cell carcinoma of skin of other parts of face: Secondary | ICD-10-CM | POA: Diagnosis not present

## 2018-01-15 DIAGNOSIS — D485 Neoplasm of uncertain behavior of skin: Secondary | ICD-10-CM | POA: Diagnosis not present

## 2018-01-15 DIAGNOSIS — C44320 Squamous cell carcinoma of skin of unspecified parts of face: Secondary | ICD-10-CM | POA: Diagnosis not present

## 2018-01-26 DIAGNOSIS — Z85828 Personal history of other malignant neoplasm of skin: Secondary | ICD-10-CM | POA: Diagnosis not present

## 2018-01-26 DIAGNOSIS — C44329 Squamous cell carcinoma of skin of other parts of face: Secondary | ICD-10-CM | POA: Diagnosis not present

## 2018-02-06 ENCOUNTER — Encounter: Payer: Self-pay | Admitting: Nurse Practitioner

## 2018-02-07 NOTE — Progress Notes (Signed)
[facility]  PATIENT: [name] DOB: 05-10-1941   REASON FOR VISIT: Follow-up for obstructive sleep apnea with CPAP HISTORY FROM: Patient    HISTORY OF PRESENT ILLNESS:UPDATE 6/13/2019CM [name], 77 year old male returns for follow-up with obstructive sleep apnea here for CPAP compliance.  He states "I do not like the machine but I feel much better".  I have less fatigue and less nocturia at night.  CPAP data dated 01/08/2018-02/06/2018 shows compliance greater than 4 hours at 97%.  Average usage 6 hours 16 minutes.  Set pressure 5 to 10 cm.  EPR level 3.  Leaks 95th percentile 6.8 AHI 4.  He returns for reevaluation     3/28/19[name] is a 77 y.o. male , seen here  in a referral   from Dr. Philip Aspen for evaluation of sleep medicine needs.   The patient has been evaluated for OSA last in 2008 and was using a oxygen concentrator, originally seen by me in the same year he was issued a CPAP machine. He could not get comfortable with CPAP, had air leaks, and aerophagia. Oxygen was finally prescribed alone - a compromise to maintain SpO2 . During the last visit with his primary care physician, Dr. [name], the patient had also been tested for short-term memory concerns.  Reported that some people he had known for many years he could no longer remember by the names.  He is also easily distracted, sometimes forgets what day of the week it is.  He also discussed via cog.  In an annual physical examination in August 2018 pravastatin was discontinued and he started DDAVP  to reduce nocturia and improve sleep quality.  October 2018 his urologist confirmed that he seemed to be doing well.  He has also used gabapentin 100 mg at bedtime which has helped with sleep.  Cardiologist last visited in December 2018 felt he was stable with no changes to EKG.  Chief complaint according to patient :  "I have no sleep issues"   Sleep habits are as follows:  He drinks a  glass of tea at dinner, sometimes cola. The patient reports that sometimes he will dose off before his wife. He goes to bed by 9.30 and reads in bed.  He is using an oxygen concentrator at 2 L with a nasal cannula.  His nocturia has been reduced to once at night after he started DDAVP.  His wife does not report that he snores, she has not witnessed any apneas.  He seems to sleep well and through the night until 6.30 when he rises, he wakes spontaneously. The couple exercises twice a week in AM with a trainer.  Drinks coffee in AM, feels refreshed most of the time, no dry mouth, dizziness, nausea, palpitations or headaches.  During the  daytime he  falls asleep easily and not physically active or mentally stimulated. He may stay asleep for about 30 minutes.     REVIEW OF SYSTEMS: Full 14 system review of systems performed and notable only for those listed, all others are neg:  Constitutional: neg  Cardiovascular: neg Ear/Nose/Throat: Hearing loss Skin: neg Eyes: neg Respiratory: neg Gastroitestinal: neg  Hematology/Lymphatic: neg  Endocrine: neg Musculoskeletal:neg Allergy/Immunology: neg Neurological: neg Psychiatric: neg Sleep : Obstructive sleep apnea with CPAP   ALLERGIES: Allergies  Allergen Reactions  . Sulfa Antibiotics Nausea Only and Rash  . Sulfamethoxazole Nausea Only and Rash  . Sulfasalazine Nausea Only and Rash    HOME MEDICATIONS: Outpatient Medications  Prior to Visit  Medication Sig Dispense Refill  . acetaminophen (TYLENOL) 500 MG tablet Take 1,000 mg by mouth every 6 (six) hours as needed for mild pain or moderate pain.    Marland Kitchen aspirin 81 MG tablet Take 81 mg by mouth daily.    . Calcium Carbonate-Vitamin D (CALTRATE 600+D PO) Take 1 tablet by mouth daily.    Marland Kitchen COENZYME Q-10 PO Take 1 tablet by mouth daily.    Marland Kitchen desmopressin (DDAVP) 0.2 MG tablet   0  . gabapentin (NEURONTIN) 100 MG capsule TAKE 1 CAPSULE BY MOUTH EVERYDAY AT BEDTIME  1  . meclizine (ANTIVERT)  25 MG tablet Take 25 mg by mouth daily.    . memantine (NAMENDA) 5 MG tablet Take 1 tablet by mouth 2 (two) times daily.    . Multiple Vitamins-Minerals (CENTRUM SILVER PO) Take 1 tablet by mouth daily.    . Omega-3 Fatty Acids (FISH OIL) 1000 MG CAPS Take 1 capsule (1,000 mg total) by mouth daily.  0  . Polyethyl Glycol-Propyl Glycol (SYSTANE OP) Apply 2 drops to eye daily as needed (Dry eyes). 2 drops each eye    . pravastatin (PRAVACHOL) 20 MG tablet Take 20 mg by mouth daily.  12  . ranitidine (ZANTAC) 150 MG capsule Take 150 mg by mouth 2 (two) times daily.    . ranitidine (ZANTAC) 150 MG tablet Take 150 mg by mouth 2 (two) times daily.  11  . VAYACOG 100-19.5-6.5 MG CAPS Take 1 capsule by mouth daily.  3   No facility-administered medications prior to visit.     PAST MEDICAL HISTORY: Past Medical History:  Diagnosis Date  . Acid reflux    a. well-controlled with ranitidine.  Marland Kitchen BPH (benign prostatic hyperplasia)    a. s/p TURP 2012.  . Essential hypertension 09/10/2013  . Eye problem    a. L eye blurred vision - seeing eye doctor for tissue pulling away from eye.  . Hard of hearing   . Hiatal hernia    a. Small-mod by CXR 2015.  Marland Kitchen High cholesterol     PAST SURGICAL HISTORY: Past Surgical History:  Procedure Laterality Date  . PARS PLANA VITRECTOMY Left 2015   WITH MEMBRANE PEEL, 23 GAUGE  . TRANSURETHRAL RESECTION OF PROSTATE    . Urethral stricture surgery      FAMILY HISTORY: Family History  Problem Relation Age of Onset  . Other Mother        Died at 54 - "old age"  . Stroke Father        Died at 46  . Tuberculosis Paternal Grandfather   . CAD Neg Hx     SOCIAL HISTORY: Social History   Socioeconomic History  . Marital status: Married    Spouse name: Not on file  . Number of children: Not on file  . Years of education: Not on file  . Highest education level: Not on file  Occupational History  . Not on file  Social Needs  . Financial resource strain:  Not on file  . Food insecurity:    Worry: Not on file    Inability: Not on file  . Transportation needs:    Medical: Not on file    Non-medical: Not on file  Tobacco Use  . Smoking status: Never Smoker  . Smokeless tobacco: Never Used  Substance and Sexual Activity  . Alcohol use: Yes    Comment: Once per month  . Drug use: No  . Sexual activity:  Not on file  Lifestyle  . Physical activity:    Days per week: Not on file    Minutes per session: Not on file  . Stress: Not on file  Relationships  . Social connections:    Talks on phone: Not on file    Gets together: Not on file    Attends religious service: Not on file    Active member of club or organization: Not on file    Attends meetings of clubs or organizations: Not on file    Relationship status: Not on file  . Intimate partner violence:    Fear of current or ex partner: Not on file    Emotionally abused: Not on file    Physically abused: Not on file    Forced sexual activity: Not on file  Other Topics Concern  . Not on file  Social History Narrative  . Not on file     PHYSICAL EXAM  Vitals:   02/08/18 0944  BP: (!) 110/54  Pulse: (!) 54  SpO2: 93%  Weight: 198 lb 3.2 oz (89.9 kg)  Height: 5\' 10"  (1.778 m)   Body mass index is 28.44 kg/m.  Generalized: Well developed, in no acute distress  Head: normocephalic and atraumatic,. Oropharynx benign mallopatti  Neck: Supple, circumference 15.5 Lungs clear Musculoskeletal: No deformity  Skin no edema Neurological examination   Mentation: Alert oriented to time, place, history taking. Attention span and concentration appropriate. Recent and remote memory intact.  Follows all commands speech and language fluent.   Cranial nerve II-XII: Pupils were equal round reactive to light extraocular movements were full, visual field were full on confrontational test. Facial sensation and strength were normal. hearing was intact to finger rubbing bilaterally. Uvula  tongue midline. head turning and shoulder shrug were normal and symmetric.Tongue protrusion into cheek strength was normal. Motor: normal bulk and tone, full strength in the BUE, BLE,  Sensory: normal and symmetric to light touch,  Coordination: finger-nose-finger, heel-to-shin bilaterally, no dysmetria Gait and Station: Rising up from seated position without assistance, normal stance,  moderate stride, good arm swing, smooth turning, able to perform tiptoe, and heel walking without difficulty. Tandem gait is steady  DIAGNOSTIC DATA (LABS, IMAGING, TESTING) - I reviewed patient records, labs, notes, testing and imaging myself where available.  Lab Results  Component Value Date   WBC 5.2 01/20/2015   HGB 15.0 01/20/2015   HCT 44.6 01/20/2015   MCV 93.3 01/20/2015   PLT 219 01/20/2015      Component Value Date/Time   NA 139 01/20/2015 0930   NA 140 02/10/2014 0956   K 4.6 01/20/2015 0930   CL 106 01/20/2015 0930   CO2 26 01/20/2015 0930   GLUCOSE 106 (H) 01/20/2015 0930   BUN 9 01/20/2015 0930   BUN 12 02/10/2014 0956   CREATININE 0.98 01/20/2015 0930   CALCIUM 9.1 01/20/2015 0930   PROT 6.1 (L) 01/20/2015 0930   PROT 6.0 02/10/2014 0956   ALBUMIN 3.9 01/20/2015 0930   ALBUMIN 4.5 02/10/2014 0956   AST 28 01/20/2015 0930   ALT 24 01/20/2015 0930   ALKPHOS 48 01/20/2015 0930   BILITOT 0.9 01/20/2015 0930   GFRNONAA >60 01/20/2015 0930   GFRAA >60 01/20/2015 0930   Lab Results  Component Value Date   CHOL 113 10/03/2013   HDL 50.50 10/03/2013   LDLCALC 48 10/03/2013   TRIG 72.0 10/03/2013   CHOLHDL 2 10/03/2013    ASSESSMENT AND PLAN  77 y.o. year  old male  has a past medical history of Acid reflux, BPH (benign prostatic hyperplasia), Essential hypertension (09/10/2013), Eye problem, Hard of hearing, Hiatal hernia, and High cholesterol. here to follow-up for obstructive sleep apnea with CPAP compliance.Data dated 01/08/2018-02/06/2018 shows compliance greater than 4 hours  at 97%.  Average usage 6 hours 16 minutes.  Set pressure 5 to 10 cm.  EPR level 3.  Leaks 95th percentile 6.8 AHI 4.   PLAN: CPAP compliance 97% Continue same settings Follow-up in 6 months for repeat compliance then yearly Dennie Bible, [facility], [facility], APRN  [facility] [address] [place], [place] [address] [phone] follow-up for obstructive sleep apnea with CPAP

## 2018-02-08 ENCOUNTER — Ambulatory Visit (INDEPENDENT_AMBULATORY_CARE_PROVIDER_SITE_OTHER): Payer: Medicare Other | Admitting: Nurse Practitioner

## 2018-02-08 ENCOUNTER — Encounter: Payer: Self-pay | Admitting: Nurse Practitioner

## 2018-02-08 DIAGNOSIS — G4733 Obstructive sleep apnea (adult) (pediatric): Secondary | ICD-10-CM | POA: Diagnosis not present

## 2018-02-08 DIAGNOSIS — Z9989 Dependence on other enabling machines and devices: Secondary | ICD-10-CM | POA: Diagnosis not present

## 2018-02-08 NOTE — Patient Instructions (Signed)
CPAP compliance 97% Continue same settings Follow-up in 6 months  

## 2018-02-16 NOTE — Progress Notes (Signed)
I agree with the assessment and plan as directed by NP .The patient is known to me .   Annamay Laymon, MD  

## 2018-04-16 DIAGNOSIS — R82998 Other abnormal findings in urine: Secondary | ICD-10-CM | POA: Diagnosis not present

## 2018-04-16 DIAGNOSIS — E7849 Other hyperlipidemia: Secondary | ICD-10-CM | POA: Diagnosis not present

## 2018-04-16 DIAGNOSIS — Z125 Encounter for screening for malignant neoplasm of prostate: Secondary | ICD-10-CM | POA: Diagnosis not present

## 2018-04-16 DIAGNOSIS — I10 Essential (primary) hypertension: Secondary | ICD-10-CM | POA: Diagnosis not present

## 2018-04-17 ENCOUNTER — Telehealth: Payer: Self-pay | Admitting: Nurse Practitioner

## 2018-04-17 DIAGNOSIS — Z9989 Dependence on other enabling machines and devices: Principal | ICD-10-CM

## 2018-04-17 DIAGNOSIS — G4733 Obstructive sleep apnea (adult) (pediatric): Secondary | ICD-10-CM

## 2018-04-17 NOTE — Telephone Encounter (Signed)
Pt wife(on DPR-June Parfait [phone] ) has called stating she received word from [facility]  That Dr Dohmeier is supposed to order supplies for pt's CPAP.  Wife is asking for a call to discuss

## 2018-04-17 NOTE — Telephone Encounter (Signed)
Spoke to wife.  Relayed that will fax order for CPAP supplies to [facility].  [phone].  Fax confirmation received.

## 2018-04-17 NOTE — Addendum Note (Signed)
Addended by: Brandon Melnick on: 04/17/2018 03:51 PM   Modules accepted: Orders

## 2018-04-23 DIAGNOSIS — M545 Low back pain: Secondary | ICD-10-CM | POA: Diagnosis not present

## 2018-04-23 DIAGNOSIS — G4733 Obstructive sleep apnea (adult) (pediatric): Secondary | ICD-10-CM | POA: Diagnosis not present

## 2018-04-23 DIAGNOSIS — Z1389 Encounter for screening for other disorder: Secondary | ICD-10-CM | POA: Diagnosis not present

## 2018-04-23 DIAGNOSIS — G2581 Restless legs syndrome: Secondary | ICD-10-CM | POA: Diagnosis not present

## 2018-04-23 DIAGNOSIS — Z6828 Body mass index (BMI) 28.0-28.9, adult: Secondary | ICD-10-CM | POA: Diagnosis not present

## 2018-04-23 DIAGNOSIS — Z0000 Encounter for general adult medical examination without abnormal findings: Secondary | ICD-10-CM | POA: Diagnosis not present

## 2018-04-23 DIAGNOSIS — E7849 Other hyperlipidemia: Secondary | ICD-10-CM | POA: Diagnosis not present

## 2018-04-23 DIAGNOSIS — Z23 Encounter for immunization: Secondary | ICD-10-CM | POA: Diagnosis not present

## 2018-04-23 DIAGNOSIS — F0390 Unspecified dementia without behavioral disturbance: Secondary | ICD-10-CM | POA: Diagnosis not present

## 2018-04-26 DIAGNOSIS — H35372 Puckering of macula, left eye: Secondary | ICD-10-CM | POA: Diagnosis not present

## 2018-04-26 DIAGNOSIS — Z961 Presence of intraocular lens: Secondary | ICD-10-CM | POA: Diagnosis not present

## 2018-04-27 DIAGNOSIS — Z1212 Encounter for screening for malignant neoplasm of rectum: Secondary | ICD-10-CM | POA: Diagnosis not present

## 2018-05-08 DIAGNOSIS — L821 Other seborrheic keratosis: Secondary | ICD-10-CM | POA: Diagnosis not present

## 2018-05-08 DIAGNOSIS — D225 Melanocytic nevi of trunk: Secondary | ICD-10-CM | POA: Diagnosis not present

## 2018-05-08 DIAGNOSIS — Z85828 Personal history of other malignant neoplasm of skin: Secondary | ICD-10-CM | POA: Diagnosis not present

## 2018-05-08 DIAGNOSIS — L570 Actinic keratosis: Secondary | ICD-10-CM | POA: Diagnosis not present

## 2018-05-08 DIAGNOSIS — D1801 Hemangioma of skin and subcutaneous tissue: Secondary | ICD-10-CM | POA: Diagnosis not present

## 2018-05-08 DIAGNOSIS — D2372 Other benign neoplasm of skin of left lower limb, including hip: Secondary | ICD-10-CM | POA: Diagnosis not present

## 2018-09-06 ENCOUNTER — Encounter: Payer: Self-pay | Admitting: Cardiology

## 2018-09-06 ENCOUNTER — Ambulatory Visit (INDEPENDENT_AMBULATORY_CARE_PROVIDER_SITE_OTHER): Payer: Medicare Other | Admitting: Cardiology

## 2018-09-06 VITALS — BP 118/58 | HR 55 | Ht 70.0 in | Wt 193.0 lb

## 2018-09-06 DIAGNOSIS — R413 Other amnesia: Secondary | ICD-10-CM | POA: Diagnosis not present

## 2018-09-06 DIAGNOSIS — I2510 Atherosclerotic heart disease of native coronary artery without angina pectoris: Secondary | ICD-10-CM

## 2018-09-06 DIAGNOSIS — E7849 Other hyperlipidemia: Secondary | ICD-10-CM

## 2018-09-06 MED ORDER — RED YEAST RICE 600 MG PO CAPS: 600.0000 mg | ORAL_CAPSULE | Freq: Every day | ORAL | 2 refills | Status: DC

## 2018-09-06 NOTE — Patient Instructions (Signed)
Medication Instructions:   STOP TAKING PRAVASTATIN NOW  START TAKING RED YEAST RICE 600 MG BY MOUTH DAILY  If you need a refill on your cardiac medications before your next appointment, please call your pharmacy.       Follow-Up: At [facility], you and your health needs are our priority.  As part of our continuing mission to provide you with exceptional heart care, we have created designated Provider Care Teams.  These Care Teams include your primary Cardiologist (physician) and Advanced Practice Providers (APPs -  Physician Assistants and Nurse Practitioners) who all work together to provide you with the care you need, when you need it. You will need a follow up appointment in 12 months.  Please call our office 2 months in advance to schedule this appointment.  You may see Ena Dawley, MD or one of the following Advanced Practice Providers on your designated Care Team:   [place], PA-C Melina Copa, PA-C . Ermalinda Barrios, PA-C

## 2018-09-06 NOTE — Progress Notes (Signed)
Patient ID: AZAIAH LICCIARDI, male   DOB: 04-Aug-1941, 78 y.o.   MRN: [id number]    Patient Name: [name] Date of Encounter: 09/06/2018  Primary Care Provider:  Leanna Battles, MD Primary Cardiologist:  Ena Dawley  Problem List   Past Medical History:  Diagnosis Date  . Acid reflux    a. well-controlled with ranitidine.  Marland Kitchen BPH (benign prostatic hyperplasia)    a. s/p TURP 2012.  . Essential hypertension 09/10/2013  . Eye problem    a. L eye blurred vision - seeing eye doctor for tissue pulling away from eye.  . Hard of hearing   . Hiatal hernia    a. Small-mod by CXR 2015.  Marland Kitchen High cholesterol    Past Surgical History:  Procedure Laterality Date  . PARS PLANA VITRECTOMY Left 2015   WITH MEMBRANE PEEL, 23 GAUGE  . TRANSURETHRAL RESECTION OF PROSTATE    . Urethral stricture surgery     Allergies  Allergies  Allergen Reactions  . Sulfa Antibiotics Nausea Only and Rash  . Sulfamethoxazole Nausea Only and Rash  . Sulfasalazine Nausea Only and Rash   HPI  The patient seen in the ER on 09/05/2012 with chest pain after 30 minutes on the treadmill (please see consult note for full history). Coronary CT showed moderate non-obstructive CAD, discharged from the ED. the patient was started on statins and is coming for her six-month followup. In the meanwhile he underwent retinal surgery and is doing well with exercise restriction. He is still exercising just avoiding bending down and lifting weights. He is completely asymptomatic and denies any side effects of Lipitor is concerned about impaired memory with Lipitor.  08/15/2017 - the patient is coming after one year, he is feeling great, he goes to gym and exercises with a personal trainer twice a week and has no chest pain shortness of breath no claudication, palpitations. Syncope. He occasionally feels dizzy when he gets up. He has been compliant to medications and has no side effects. No bleeding. His pravastatin was discontinued  by Dr. Sharlett Iles as he has progressively worsening memory issues.  09/06/2018 -1 year follow-up, the patient is doing great, he and his wife go to gym twice a week and have no symptoms of shortness of breath chest pain, no dizziness palpitations, lower extremity edema.  He takes all of his medicines and has no side effects.  However his memory impairment continue to deteriorate, he was not able to find his way to our office today, his wife now takes care of all of the bills and other things that require good memory function.  Home Medications  Prior to Admission medications   Medication Sig Start Date End Date Taking? Authorizing Provider  aspirin 81 MG tablet Take 81 mg by mouth daily.   Yes Historical Provider, MD  Bromfenac Sodium 0.09 % SOLN Place 1 drop into the left eye 2 (two) times daily. 08/20/13  Yes Historical Provider, MD  Calcium Carbonate-Vitamin D (CALTRATE 600+D PO) Take 1 tablet by mouth daily.   Yes Historical Provider, MD  ketorolac (ACULAR) 0.5 % ophthalmic solution Place 1 drop into the left eye 2 (two) times daily. 07/07/13  Yes Historical Provider, MD  meclizine (ANTIVERT) 25 MG tablet Take 25 mg by mouth daily. 06/05/13  Yes Historical Provider, MD  Multiple Vitamins-Minerals (CENTRUM SILVER PO) Take 1 tablet by mouth daily.   Yes Historical Provider, MD  Phosphatidylserine-DHA-EPA (VAYACOG PO) Take 1 capsule by mouth daily.   Yes  Historical Provider, MD  pravastatin (PRAVACHOL) 40 MG tablet Take 40 mg by mouth daily. 06/05/13  Yes Historical Provider, MD  ranitidine (ZANTAC) 150 MG capsule Take 150 mg by mouth 2 (two) times daily. 09/03/13  Yes Historical Provider, MD  rosuvastatin (CRESTOR) 20 MG tablet Take 1 tablet (20 mg total) by mouth daily. 09/05/13   Jarrett Soho Muthersbaugh, PA-C   Family History  Family History  Problem Relation Age of Onset  . Other Mother        Died at 61 - "old age"  . Stroke Father        Died at 5  . Tuberculosis Paternal Grandfather   . CAD  Neg Hx     Social History  Social History   Socioeconomic History  . Marital status: Married    Spouse name: Not on file  . Number of children: Not on file  . Years of education: Not on file  . Highest education level: Not on file  Occupational History  . Not on file  Social Needs  . Financial resource strain: Not on file  . Food insecurity:    Worry: Not on file    Inability: Not on file  . Transportation needs:    Medical: Not on file    Non-medical: Not on file  Tobacco Use  . Smoking status: Never Smoker  . Smokeless tobacco: Never Used  Substance and Sexual Activity  . Alcohol use: Yes    Comment: Once per month  . Drug use: No  . Sexual activity: Not on file  Lifestyle  . Physical activity:    Days per week: Not on file    Minutes per session: Not on file  . Stress: Not on file  Relationships  . Social connections:    Talks on phone: Not on file    Gets together: Not on file    Attends religious service: Not on file    Active member of club or organization: Not on file    Attends meetings of clubs or organizations: Not on file    Relationship status: Not on file  . Intimate partner violence:    Fear of current or ex partner: Not on file    Emotionally abused: Not on file    Physically abused: Not on file    Forced sexual activity: Not on file  Other Topics Concern  . Not on file  Social History Narrative  . Not on file     Review of Systems, as per HPI, otherwise negative General:  No chills, fever, night sweats or weight changes.  Cardiovascular:  No chest pain, dyspnea on exertion, edema, orthopnea, palpitations, paroxysmal nocturnal dyspnea. Dermatological: No rash, lesions/masses Respiratory: No cough, dyspnea Urologic: No hematuria, dysuria Abdominal:   No nausea, vomiting, diarrhea, bright red blood per rectum, melena, or hematemesis Neurologic:  No visual changes, wkns, changes in mental status. All other systems reviewed and are otherwise  negative except as noted above.  Physical Exam  Blood pressure (!) 118/58, pulse (!) 55, height 5\' 10"  (1.778 m), weight 193 lb (87.5 kg).  General: Pleasant, NAD Psych: Normal affect. Neuro: Alert and oriented X 3. Moves all extremities spontaneously. HEENT: Normal  Neck: Supple without bruits or JVD. Lungs:  Resp regular and unlabored, CTA. Heart: RRR no s3, s4, or murmurs. Abdomen: Soft, non-tender, non-distended, BS + x 4.  Extremities: No clubbing, cyanosis or edema. DP/PT/Radials 2+ and equal bilaterally.  Labs:  No results for input(s): CKTOTAL, CKMB, TROPONINI  in the last 72 hours. Lab Results  Component Value Date   WBC 5.2 01/20/2015   HGB 15.0 01/20/2015   HCT 44.6 01/20/2015   MCV 93.3 01/20/2015   PLT 219 01/20/2015   No results for input(s): NA, K, CL, CO2, BUN, CREATININE, CALCIUM, PROT, BILITOT, ALKPHOS, ALT, AST, GLUCOSE in the last 168 hours.  Invalid input(s): LABALBU Accessory Clinical Findings  Echocardiogram - none  ECG - performed today 08/15/2017 and personally reviewed, shows sinus bradycardia otherwise normal EKG unchanged from prior.   Assessment & Plan  1. Moderate diffuse non-obstructive CAD on coronary CT in May 2016, started on pravastatin 20 mg po daily, however we will discontinue as he has progressively worsening memory problems, will start red yeast rice 600 mg daily.  Labs followed by Dr. Sharlett Iles.   He is asymptomatic and his EKG remains normal.  2.  Memory loss -  discontinued pravastatin , we will start red yeast rice.  3.  Hyperlipidemia -  As above.  Follow up in 1 year.   Ena Dawley, MD, [facility] 09/06/2018, 10:30 AM

## 2018-09-10 ENCOUNTER — Encounter: Payer: Self-pay | Admitting: Neurology

## 2018-09-12 ENCOUNTER — Ambulatory Visit (INDEPENDENT_AMBULATORY_CARE_PROVIDER_SITE_OTHER): Payer: Medicare Other | Admitting: Nurse Practitioner

## 2018-09-12 ENCOUNTER — Encounter: Payer: Self-pay | Admitting: Nurse Practitioner

## 2018-09-12 VITALS — BP 120/61 | HR 53 | Ht 70.0 in | Wt 194.2 lb

## 2018-09-12 DIAGNOSIS — I2510 Atherosclerotic heart disease of native coronary artery without angina pectoris: Secondary | ICD-10-CM | POA: Diagnosis not present

## 2018-09-12 DIAGNOSIS — Z9989 Dependence on other enabling machines and devices: Secondary | ICD-10-CM

## 2018-09-12 DIAGNOSIS — G4733 Obstructive sleep apnea (adult) (pediatric): Secondary | ICD-10-CM

## 2018-09-12 NOTE — Progress Notes (Signed)
[facility]  PATIENT: [name] DOB: [birth year]   REASON FOR VISIT: Follow-up for obstructive sleep apnea with CPAP HISTORY FROM: Patient    HISTORY OF PRESENT ILLNESS:UPDATE 1/15/2020CM [name], 78 year old male returns for follow-up with a history of obstructive sleep apnea here for CPAP compliance.  He has been doing well with his machine and is sleeping much better.  Compliance data dated 08/12/2018 to 09/10/2018 shows compliance greater than 4 hours at 100%.  Average usage 6 hours 58 minutes.  Set pressure 5 to 10 cm.  EPR level 3 AHI 3.4 ESS 6.  He returns for reevaluation   UPDATE 6/13/2019CM [name], 78 year old male returns for follow-up with obstructive sleep apnea here for CPAP compliance.  He states "I do not like the machine but I feel much better".  I have less fatigue and less nocturia at night.  CPAP data dated 01/08/2018-02/06/2018 shows compliance greater than 4 hours at 97%.  Average usage 6 hours 16 minutes.  Set pressure 5 to 10 cm.  EPR level 3.  Leaks 95th percentile 6.8 AHI 4.  He returns for reevaluation     3/28/19CDErvin R Beasley is a 78 y.o. male , seen here  in a referral   from Dr. Philip Aspen for evaluation of sleep medicine needs.   The patient has been evaluated for OSA last in 2008 and was using a oxygen concentrator, originally seen by me in the same year he was issued a CPAP machine. He could not get comfortable with CPAP, had air leaks, and aerophagia. Oxygen was finally prescribed alone - a compromise to maintain SpO2 . During the last visit with his primary care physician, Dr. [name], the patient had also been tested for short-term memory concerns.  Reported that some people he had known for many years he could no longer remember by the names.  He is also easily distracted, sometimes forgets what day of the week it is.  He also discussed via cog.  In an annual physical examination in August 2018 pravastatin was  discontinued and he started DDAVP  to reduce nocturia and improve sleep quality.  October 2018 his urologist confirmed that he seemed to be doing well.  He has also used gabapentin 100 mg at bedtime which has helped with sleep.  Cardiologist last visited in December 2018 felt he was stable with no changes to EKG.  Chief complaint according to patient :  "I have no sleep issues"   Sleep habits are as follows:  He drinks a glass of tea at dinner, sometimes cola. The patient reports that sometimes he will dose off before his wife. He goes to bed by 9.30 and reads in bed.  He is using an oxygen concentrator at 2 L with a nasal cannula.  His nocturia has been reduced to once at night after he started DDAVP.  His wife does not report that he snores, she has not witnessed any apneas.  He seems to sleep well and through the night until 6.30 when he rises, he wakes spontaneously. The couple exercises twice a week in AM with a trainer.  Drinks coffee in AM, feels refreshed most of the time, no dry mouth, dizziness, nausea, palpitations or headaches.  During the  daytime he  falls asleep easily and not physically active or mentally stimulated. He may stay asleep for about 30 minutes.     REVIEW OF SYSTEMS: Full 14 system review of systems performed and notable only for those  listed, all others are neg:  Constitutional: neg  Cardiovascular: neg Ear/Nose/Throat: Hearing loss Skin: neg Eyes: neg Respiratory: neg Gastroitestinal: neg  Hematology/Lymphatic: neg  Endocrine: neg Musculoskeletal:neg Allergy/Immunology: neg Neurological: neg Psychiatric: neg Sleep : Obstructive sleep apnea with CPAP   ALLERGIES: Allergies  Allergen Reactions  . Sulfa Antibiotics Nausea Only and Rash  . Sulfamethoxazole Nausea Only and Rash  . Sulfasalazine Nausea Only and Rash    HOME MEDICATIONS: Outpatient Medications Prior to Visit  Medication Sig Dispense Refill  . acetaminophen (TYLENOL) 500 MG tablet Take  1,000 mg by mouth every 6 (six) hours as needed for mild pain or moderate pain.    Marland Kitchen alfuzosin (UROXATRAL) 10 MG 24 hr tablet 10 mg daily.    Marland Kitchen aspirin 81 MG tablet Take 81 mg by mouth daily.    . Calcium Carbonate-Vitamin D (CALTRATE 600+D PO) Take 1 tablet by mouth daily.    Marland Kitchen COENZYME Q-10 PO Take 1 tablet by mouth daily.    Marland Kitchen desmopressin (DDAVP) 0.2 MG tablet   0  . FERREX 150 150 MG capsule Take 150 mg by mouth daily.    Marland Kitchen gabapentin (NEURONTIN) 100 MG capsule TAKE 1 CAPSULE BY MOUTH EVERYDAY AT BEDTIME  1  . meclizine (ANTIVERT) 25 MG tablet Take 25 mg by mouth daily.    . memantine (NAMENDA) 5 MG tablet Take 1 tablet by mouth 2 (two) times daily.    . Multiple Vitamins-Minerals (CENTRUM SILVER PO) Take 1 tablet by mouth daily.    . Omega-3 Fatty Acids (FISH OIL) 1000 MG CAPS Take 1 capsule (1,000 mg total) by mouth daily.  0  . Polyethyl Glycol-Propyl Glycol (SYSTANE OP) Apply 2 drops to eye daily as needed (Dry eyes). 2 drops each eye    . ranitidine (ZANTAC) 150 MG tablet Take 150 mg by mouth 2 (two) times daily.  11  . Red Yeast Rice 600 MG CAPS Take 1 capsule (600 mg total) by mouth daily. 90 capsule 2  . VITAMIN E PO Take 1 capsule by mouth 2 (two) times daily.     No facility-administered medications prior to visit.     PAST MEDICAL HISTORY: Past Medical History:  Diagnosis Date  . Acid reflux    a. well-controlled with ranitidine.  Marland Kitchen BPH (benign prostatic hyperplasia)    a. s/p TURP 2012.  . Essential hypertension 09/10/2013  . Eye problem    a. L eye blurred vision - seeing eye doctor for tissue pulling away from eye.  . Hard of hearing   . Hiatal hernia    a. Small-mod by CXR 2015.  Marland Kitchen High cholesterol     PAST SURGICAL HISTORY: Past Surgical History:  Procedure Laterality Date  . PARS PLANA VITRECTOMY Left 2015   WITH MEMBRANE PEEL, 23 GAUGE  . TRANSURETHRAL RESECTION OF PROSTATE    . Urethral stricture surgery      FAMILY HISTORY: Family History    Problem Relation Age of Onset  . Other Mother        Died at 69 - "old age"  . Stroke Father        Died at 51  . Tuberculosis Paternal Grandfather   . Dementia Brother   . Stroke Brother   . CAD Neg Hx     SOCIAL HISTORY: Social History   Socioeconomic History  . Marital status: Married    Spouse name: [name]  . Number of children: Not on file  . Years of education: Not on  file  . Highest education level: Not on file  Occupational History  . Not on file  Social Needs  . Financial resource strain: Not on file  . Food insecurity:    Worry: Not on file    Inability: Not on file  . Transportation needs:    Medical: Not on file    Non-medical: Not on file  Tobacco Use  . Smoking status: Never Smoker  . Smokeless tobacco: Never Used  Substance and Sexual Activity  . Alcohol use: Yes    Comment: Once per month  . Drug use: No  . Sexual activity: Not on file  Lifestyle  . Physical activity:    Days per week: Not on file    Minutes per session: Not on file  . Stress: Not on file  Relationships  . Social connections:    Talks on phone: Not on file    Gets together: Not on file    Attends religious service: Not on file    Active member of club or organization: Not on file    Attends meetings of clubs or organizations: Not on file    Relationship status: Not on file  . Intimate partner violence:    Fear of current or ex partner: Not on file    Emotionally abused: Not on file    Physically abused: Not on file    Forced sexual activity: Not on file  Other Topics Concern  . Not on file  Social History Narrative  . Not on file     PHYSICAL EXAM  Vitals:   09/12/18 1055  BP: 120/61  Pulse: (!) 53  Weight: 194 lb 3.2 oz (88.1 kg)  Height: 5\' 10"  (1.778 m)   Body mass index is 27.86 kg/m.  Generalized: Well developed, in no acute distress  Head: normocephalic and atraumatic,. Oropharynx benign mallopatti  Neck: Supple, circumference 15.5 Lungs  clear Musculoskeletal: No deformity  Skin no edema or rash Neurological examination   Mentation: Alert oriented to time, place, history taking. Attention span and concentration appropriate. Recent and remote memory intact.  Follows all commands speech and language fluent.   Cranial nerve II-XII: Pupils were equal round reactive to light extraocular movements were full, visual field were full on confrontational test. Facial sensation and strength were normal. hearing was intact to finger rubbing bilaterally. Uvula tongue midline. head turning and shoulder shrug were normal and symmetric.Tongue protrusion into cheek strength was normal. Motor: normal bulk and tone, full strength in the BUE, BLE,  Sensory: normal and symmetric to light touch,  Coordination: finger-nose-finger, heel-to-shin bilaterally, no dysmetria Gait and Station: Rising up from seated position without assistance, normal stance,  moderate stride, good arm swing, smooth turning, able to perform tiptoe, and heel walking without difficulty. Tandem gait is steady  DIAGNOSTIC DATA (LABS, IMAGING, TESTING) - I reviewed patient records, labs, notes, testing and imaging myself where available.  Lab Results  Component Value Date   WBC 5.2 01/20/2015   HGB 15.0 01/20/2015   HCT 44.6 01/20/2015   MCV 93.3 01/20/2015   PLT 219 01/20/2015      Component Value Date/Time   NA 139 01/20/2015 0930   NA 140 02/10/2014 0956   K 4.6 01/20/2015 0930   CL 106 01/20/2015 0930   CO2 26 01/20/2015 0930   GLUCOSE 106 (H) 01/20/2015 0930   BUN 9 01/20/2015 0930   BUN 12 02/10/2014 0956   CREATININE 0.98 01/20/2015 0930   CALCIUM 9.1 01/20/2015 0930  PROT 6.1 (L) 01/20/2015 0930   PROT 6.0 02/10/2014 0956   ALBUMIN 3.9 01/20/2015 0930   ALBUMIN 4.5 02/10/2014 0956   AST 28 01/20/2015 0930   ALT 24 01/20/2015 0930   ALKPHOS 48 01/20/2015 0930   BILITOT 0.9 01/20/2015 0930   GFRNONAA >60 01/20/2015 0930   GFRAA >60 01/20/2015 0930    Lab Results  Component Value Date   CHOL 113 10/03/2013   HDL 50.50 10/03/2013   LDLCALC 48 10/03/2013   TRIG 72.0 10/03/2013   CHOLHDL 2 10/03/2013    ASSESSMENT AND PLAN  78 y.o. year old male  has a past medical history of Acid reflux, BPH (benign prostatic hyperplasia), Essential hypertension (09/10/2013), Eye problem, Hard of hearing, Hiatal hernia, and High cholesterol. here to follow-up for obstructive sleep apnea with CPAP compliance.Data dated 08/12/2018 to 09/10/2018 shows compliance greater than 4 hours at 100%.  Average usage 6 hours 58 minutes.  Set pressure 5 to 10 cm.  EPR level 3 AHI 3.4 ESS 6  PLAN: CPAP compliance 100% Continue same settings Follow-up  yearly Dennie Bible, [facility], [facility], APRN  [facility] [address], [place], [place] [address] [phone] follow-up for obstructive sleep apnea with CPAP

## 2018-09-12 NOTE — Patient Instructions (Signed)
CPAP compliance 100% Continue same settings Follow-up yearly  

## 2018-10-22 DIAGNOSIS — I10 Essential (primary) hypertension: Secondary | ICD-10-CM | POA: Diagnosis not present

## 2018-10-22 DIAGNOSIS — E7849 Other hyperlipidemia: Secondary | ICD-10-CM | POA: Diagnosis not present

## 2018-10-22 DIAGNOSIS — R5383 Other fatigue: Secondary | ICD-10-CM | POA: Diagnosis not present

## 2018-10-22 DIAGNOSIS — Z1331 Encounter for screening for depression: Secondary | ICD-10-CM | POA: Diagnosis not present

## 2018-10-22 DIAGNOSIS — G2581 Restless legs syndrome: Secondary | ICD-10-CM | POA: Diagnosis not present

## 2018-10-22 DIAGNOSIS — R0789 Other chest pain: Secondary | ICD-10-CM | POA: Diagnosis not present

## 2018-10-22 DIAGNOSIS — G4733 Obstructive sleep apnea (adult) (pediatric): Secondary | ICD-10-CM | POA: Diagnosis not present

## 2018-10-22 DIAGNOSIS — Z6828 Body mass index (BMI) 28.0-28.9, adult: Secondary | ICD-10-CM | POA: Diagnosis not present

## 2018-10-22 DIAGNOSIS — R7989 Other specified abnormal findings of blood chemistry: Secondary | ICD-10-CM | POA: Diagnosis not present

## 2018-10-30 ENCOUNTER — Encounter: Payer: Self-pay | Admitting: Physician Assistant

## 2018-10-30 NOTE — Progress Notes (Signed)
Cardiology Office Note    Date:  10/31/2018   [name], DOB [birth year], MRN [id number]  PCP:  Leanna Battles, MD  Cardiologist: Ena Dawley, MD EPS: None  Chief Complaint  Patient presents with  . Chest Pain    History of Present Illness:  [name] is a 78 y.o. male with history of chest pain and moderate nonobstructive CAD on coronary CT in 2015.  Statins were stopped because of memory problems.  Patient saw Dr. [name] 09/06/2018 at which time he was doing well.  Patient now on my schedule on referral from Dr. Sharlett Iles for chest pain. Pain in left breast up into shoulder and over into back. It's there all the time but worse with rushing here today. Works with a Clinical research associate and the [employer] he uses makes it worse.When on treadmill or elliptical it doesn't change. Feels stronger when he takes a deep breath, not as strong when lays down. No associated shortness of breath, dizziness.  No recent viral infection or long distance travel.  Denies chest pressure or heaviness radiating into the neck or his arm or increased shortness of breath.  No GERD symptoms.     Past Medical History:  Diagnosis Date  . Acid reflux    a. well-controlled with ranitidine.  Marland Kitchen BPH (benign prostatic hyperplasia)    a. s/p TURP 2012.  . Essential hypertension 09/10/2013  . Eye problem    a. L eye blurred vision - seeing eye doctor for tissue pulling away from eye.  . Hard of hearing   . Hiatal hernia    a. Small-mod by CXR 2015.  Marland Kitchen High cholesterol     Past Surgical History:  Procedure Laterality Date  . PARS PLANA VITRECTOMY Left 2015   WITH MEMBRANE PEEL, 23 GAUGE  . TRANSURETHRAL RESECTION OF PROSTATE    . Urethral stricture surgery      Current Medications: Current Meds  Medication Sig  . acetaminophen (TYLENOL) 500 MG tablet Take 1,000 mg by mouth every 6 (six) hours as needed for mild pain or moderate pain.  Marland Kitchen alfuzosin (UROXATRAL) 10 MG 24 hr tablet 10 mg daily.  Marland Kitchen aspirin  81 MG tablet Take 81 mg by mouth daily.  . Calcium Carbonate-Vitamin D (CALTRATE 600+D PO) Take 1 tablet by mouth daily.  Marland Kitchen COENZYME Q-10 PO Take 1 tablet by mouth daily.  Marland Kitchen desmopressin (DDAVP) 0.2 MG tablet   . FERREX 150 150 MG capsule Take 150 mg by mouth daily.  Marland Kitchen gabapentin (NEURONTIN) 100 MG capsule TAKE 1 CAPSULE BY MOUTH EVERYDAY AT BEDTIME  . meclizine (ANTIVERT) 25 MG tablet Take 25 mg by mouth daily.  . memantine (NAMENDA) 5 MG tablet Take 1 tablet by mouth 2 (two) times daily.  . Multiple Vitamins-Minerals (CENTRUM SILVER PO) Take 1 tablet by mouth daily.  . Omega-3 Fatty Acids (FISH OIL) 1000 MG CAPS Take 1 capsule (1,000 mg total) by mouth daily.  Vladimir Faster Glycol-Propyl Glycol (SYSTANE OP) Apply 2 drops to eye daily as needed (Dry eyes). 2 drops each eye  . ranitidine (ZANTAC) 150 MG tablet Take 150 mg by mouth 2 (two) times daily.  . Red Yeast Rice 600 MG CAPS Take 1 capsule (600 mg total) by mouth daily.  Marland Kitchen rOPINIRole (REQUIP) 1 MG tablet Take 1 tablet by mouth at bedtime.  Marland Kitchen VITAMIN E PO Take 1 capsule by mouth 2 (two) times daily.     Allergies:   Sulfa antibiotics; Sulfamethoxazole; and Sulfasalazine  Social History   Socioeconomic History  . Marital status: Married    Spouse name: June  . Number of children: Not on file  . Years of education: Not on file  . Highest education level: Not on file  Occupational History  . Not on file  Social Needs  . Financial resource strain: Not on file  . Food insecurity:    Worry: Not on file    Inability: Not on file  . Transportation needs:    Medical: Not on file    Non-medical: Not on file  Tobacco Use  . Smoking status: Never Smoker  . Smokeless tobacco: Never Used  Substance and Sexual Activity  . Alcohol use: Yes    Comment: Once per month  . Drug use: No  . Sexual activity: Not on file  Lifestyle  . Physical activity:    Days per week: Not on file    Minutes per session: Not on file  . Stress: Not on  file  Relationships  . Social connections:    Talks on phone: Not on file    Gets together: Not on file    Attends religious service: Not on file    Active member of club or organization: Not on file    Attends meetings of clubs or organizations: Not on file    Relationship status: Not on file  Other Topics Concern  . Not on file  Social History Narrative  . Not on file     Family History:  The patient's family history includes Dementia in his brother; Other in his mother; Stroke in his brother and father; Tuberculosis in his paternal grandfather.   ROS:   Please see the history of present illness.    Review of Systems  Constitution: Negative.  HENT: Negative.   Cardiovascular: Positive for chest pain.  Respiratory: Negative.   Endocrine: Negative.   Hematologic/Lymphatic: Negative.   Musculoskeletal: Negative.   Gastrointestinal: Negative.   Genitourinary: Negative.   Neurological: Negative.   Psychiatric/Behavioral: Positive for memory loss.   All other systems reviewed and are negative.   PHYSICAL EXAM:   VS:  BP (!) 120/52   Pulse 68   Ht 5\' 10"  (1.778 m)   Wt 195 lb (88.5 kg)   SpO2 97%   BMI 27.98 kg/m   Physical Exam  GEN: Well nourished, well developed, in no acute distress  Neck: no JVD, carotid bruits, or masses Cardiac: Left upper chest is tender to touch and reproduces the pain that he is having.  RRR; no murmurs, rubs, or gallops  Respiratory:  clear to auscultation bilaterally, normal work of breathing GI: soft, nontender, nondistended, + BS Ext: without cyanosis, clubbing, or edema, Good distal pulses bilaterally Neuro:  Alert and Oriented x 3 Psych: euthymic mood, full affect  Wt Readings from Last 3 Encounters:  10/31/18 195 lb (88.5 kg)  09/12/18 194 lb 3.2 oz (88.1 kg)  09/06/18 193 lb (87.5 kg)      Studies/Labs Reviewed:   EKG:  EKG is  ordered today.  The ekg ordered today demonstrates normal sinus rhythm with mild LVH, no acute  change when compared to prior EKGs  Recent Labs: No results found for requested labs within last 8760 hours.   Lipid Panel    Component Value Date/Time   CHOL 113 10/03/2013 0749   TRIG 72.0 10/03/2013 0749   HDL 50.50 10/03/2013 0749   CHOLHDL 2 10/03/2013 0749   VLDL 14.4 10/03/2013 0749   LDLCALC  48 10/03/2013 0749    Additional studies/ records that were reviewed today include:   Coronary CTA 2015 FINDINGS: Coronary Arteries:  Originating in the normal position.   Left main has only minimal atherosclerotic plague. LM gives rise to LAD, small ramus intermedius and non-dominant LCX.   Ostial LAD has a mild mixed plague with 0-25% stenosis. LAD gives rise to a large diagonal branch (almost like dual LAD).   Proximal and mid LAD has a long moderate circumferential calcified plague with associated 25-50% stenosis. Distal LAD has only mild luminal irregularities.   First diagonal branch has calcified plague with associated 0-25% stenosis.   LCX is medium size non-dominant vessel. There is mild calcified plague in its mid segment associated with 0-25% stenosis.   RCA is a large dominant vessel. There is mild noncalcified plague in the mid RCA associated with 25-50% stenosis. Distal RCA has minimal calcified plague associated with <25% stenosis.   RCA gives rise to a large acute marginal branch that supplies PDA territory and a large PLVB. They both have only minimal calcified plague with associated 0-25% stenosis.   IMPRESSION: 1. Moderate diffuse non-obstructive CAD. Aggressive medical management is recommended.   Ena Dawley     Electronically Signed   By: Jenkins Rouge M.D.   On: 09/05/2013 17:49       ASSESSMENT:    1. Chest pain, unspecified type   2. Arteriosclerosis of coronary artery   3. Other hyperlipidemia      PLAN:  In order of problems listed above:  Chest pain reproducible to touch on exam and worse when lifting weights with a  trainer.  Chest pain has been constant for couple months.  No worsening chest pain when on the treadmill or elliptical.  No recent travel or viral infection.  Suspect this is musculoskeletal.  Will check a d-dimer and 2D echo.  Recommend Tylenol or Advil and stop lifting weights to see if the pain goes away.  We will see him back in 1 to 2 weeks for follow-up.  Described symptoms of angina and when to go to the emergency room.  Moderate diffuse nonobstructive CAD on CT in 2015.  Statin stopped because of memory loss  Hyperlipidemia now on fish oil and red yeast rice.  Medication Adjustments/Labs and Tests Ordered: Current medicines are reviewed at length with the patient today.  Concerns regarding medicines are outlined above.  Medication changes, Labs and Tests ordered today are listed in the Patient Instructions below. Patient Instructions  Medication Instructions:  Your physician recommends that you continue on your current medications as directed. Please refer to the Current Medication list given to you today.  If you need a refill on your cardiac medications before your next appointment, please call your pharmacy.   Lab work: TODAY:  D-DIMER (*stat*), BMET & CBC  If you have labs (blood work) drawn today and your tests are completely normal, you will receive your results only by: Marland Kitchen MyChart Message (if you have MyChart) OR . A paper copy in the mail If you have any lab test that is abnormal or we need to change your treatment, we will call you to review the results.  Testing/Procedures: Your physician has requested that you have an echocardiogram. Echocardiography is a painless test that uses sound waves to create images of your heart. It provides your doctor with information about the size and shape of your heart and how well your heart's chambers and valves are working. This procedure takes  approximately one hour. There are no restrictions for this procedure.    Follow-Up: Follow up  with Ermalinda Barrios PA-C on 11/13/2018 @ 2:00 PM  Any Other Special Instructions Will Be Listed Below (If Applicable).   Do not lift any weights until after you see Ermalinda Barrios PA-C at your next appointment    Signed, Ermalinda Barrios, PA-C  10/31/2018 9:43 AM    [address], [place], [place]  [address] Phone: [phone]; Fax: [phone]

## 2018-10-31 ENCOUNTER — Ambulatory Visit (INDEPENDENT_AMBULATORY_CARE_PROVIDER_SITE_OTHER): Payer: Medicare Other | Admitting: Physician Assistant

## 2018-10-31 ENCOUNTER — Encounter: Payer: Self-pay | Admitting: Physician Assistant

## 2018-10-31 VITALS — BP 120/52 | HR 68 | Ht 70.0 in | Wt 195.0 lb

## 2018-10-31 DIAGNOSIS — E7849 Other hyperlipidemia: Secondary | ICD-10-CM

## 2018-10-31 DIAGNOSIS — I2510 Atherosclerotic heart disease of native coronary artery without angina pectoris: Secondary | ICD-10-CM

## 2018-10-31 DIAGNOSIS — R079 Chest pain, unspecified: Secondary | ICD-10-CM

## 2018-10-31 LAB — BASIC METABOLIC PANEL
BUN / CREAT RATIO: 16 (ref 10–24)
BUN: 16 mg/dL (ref 8–27)
CO2: 27 mmol/L (ref 20–29)
CREATININE: 1.01 mg/dL (ref 0.76–1.27)
Calcium: 8.8 mg/dL (ref 8.6–10.2)
Chloride: 105 mmol/L (ref 96–106)
GFR calc Af Amer: 82 mL/min/{1.73_m2} (ref >59)
GFR, EST NON AFRICAN AMERICAN: 71 mL/min/{1.73_m2} (ref >59)
GLUCOSE: 108 mg/dL — AB (ref 65–99)
Potassium: 4.2 mmol/L (ref 3.5–5.2)
SODIUM: 138 mmol/L (ref 134–144)

## 2018-10-31 LAB — D-DIMER, QUANTITATIVE: D-DIMER: 0.36 mg/L FEU (ref 0.00–0.49)

## 2018-10-31 LAB — CBC
Hematocrit: 39.6 % (ref 37.5–51.0)
Hemoglobin: 13.6 g/dL (ref 13.0–17.7)
MCH: 31.7 pg (ref 26.6–33.0)
MCHC: 34.3 g/dL (ref 31.5–35.7)
MCV: 92 fL (ref 79–97)
Platelets: 238 10*3/uL (ref 150–450)
RBC: 4.29 x10E6/uL (ref 4.14–5.80)
RDW: 12.9 % (ref 11.6–15.4)
WBC: 6 10*3/uL (ref 3.4–10.8)

## 2018-10-31 NOTE — Patient Instructions (Signed)
Medication Instructions:  Your physician recommends that you continue on your current medications as directed. Please refer to the Current Medication list given to you today.  If you need a refill on your cardiac medications before your next appointment, please call your pharmacy.   Lab work: TODAY:  D-DIMER (*stat*), BMET & CBC  If you have labs (blood work) drawn today and your tests are completely normal, you will receive your results only by: Marland Kitchen MyChart Message (if you have MyChart) OR . A paper copy in the mail If you have any lab test that is abnormal or we need to change your treatment, we will call you to review the results.  Testing/Procedures: Your physician has requested that you have an echocardiogram. Echocardiography is a painless test that uses sound waves to create images of your heart. It provides your doctor with information about the size and shape of your heart and how well your heart's chambers and valves are working. This procedure takes approximately one hour. There are no restrictions for this procedure.    Follow-Up: Follow up with Ermalinda Barrios PA-C on 11/13/2018 @ 2:00 PM  Any Other Special Instructions Will Be Listed Below (If Applicable).   Do not lift any weights until after you see Ermalinda Barrios PA-C at your next appointment

## 2018-11-01 ENCOUNTER — Telehealth: Payer: Self-pay | Admitting: Physician Assistant

## 2018-11-01 NOTE — Telephone Encounter (Signed)
Left message for patient to call back  

## 2018-11-01 NOTE — Telephone Encounter (Signed)
New Message ° ° ° °Pts wife is calling for results  ° ° ° ° °Please call back  °

## 2018-11-01 NOTE — Telephone Encounter (Signed)
Follow up    Please return call regarding lab results.

## 2018-11-01 NOTE — Telephone Encounter (Signed)
The patient has been notified of the result and verbalized understanding.  All questions (if any) were answered. Cleon Gustin, RN 11/01/2018 3:04 PM

## 2018-11-01 NOTE — Telephone Encounter (Signed)
-----   Message from Imogene Burn, PA-C sent at 10/31/2018  2:10 PM EST ----- Labs completely normal including d-dimer so not worried about clotting.  Continue with current plan.

## 2018-11-02 ENCOUNTER — Ambulatory Visit (HOSPITAL_COMMUNITY): Payer: Medicare Other | Attending: Internal Medicine

## 2018-11-02 DIAGNOSIS — R079 Chest pain, unspecified: Secondary | ICD-10-CM | POA: Diagnosis not present

## 2018-11-02 MED ORDER — PERFLUTREN LIPID MICROSPHERE
1.0000 mL | INTRAVENOUS | Status: AC | PRN
  Administered 2018-11-02: 2 mL via INTRAVENOUS

## 2018-11-12 ENCOUNTER — Telehealth: Payer: Self-pay | Admitting: Physician Assistant

## 2018-11-12 NOTE — Telephone Encounter (Signed)
Attempted to return call and phone kept ringing busy.

## 2018-11-12 NOTE — Telephone Encounter (Signed)
° °  Patient's spouse calling , states she is returning a call directly from B. Pamala Duffel Nurse unavailable

## 2018-11-13 ENCOUNTER — Ambulatory Visit: Payer: Medicare Other | Admitting: Physician Assistant

## 2018-11-13 NOTE — Telephone Encounter (Signed)
Donald Barrios, PA calling patient now

## 2018-11-13 NOTE — Telephone Encounter (Signed)
I just called patient and spoke with him directly.  He says his chest pain is much improved since stopping exercise.  It is not constant anymore and only mild to touch.  He is happy to reschedule and not come in today in the midst of the coronavirus.

## 2018-11-13 NOTE — Progress Notes (Deleted)
Cardiology Office Note    Date:  11/13/2018   [name], DOB 1940/10/12, MRN [id number]  PCP:  Leanna Battles, MD  Cardiologist: Ena Dawley, MD EPS: None  No chief complaint on file.   History of Present Illness:  [name] is a 78 y.o. male with history of chest pain and moderate nonobstructive CAD on coronary CT in 2015.  Statins were stopped because of memory problems.   Patient saw Dr. [name] 09/06/2018 at which time he was doing well.  I saw the patient 10/31/2018 with musculoskeletal type chest pain that was reproducible to touch on exam and worse when lifting weights with a trainer.  It has been constant for a couple months.  No worsening with exertion.  I checked a d-dimer which was normal and 2D echo showed normal LV function greater than 65% with mild diastolic dysfunction.     Past Medical History:  Diagnosis Date  . Acid reflux    a. well-controlled with ranitidine.  Marland Kitchen BPH (benign prostatic hyperplasia)    a. s/p TURP 2012.  . Essential hypertension 09/10/2013  . Eye problem    a. L eye blurred vision - seeing eye doctor for tissue pulling away from eye.  . Hard of hearing   . Hiatal hernia    a. Small-mod by CXR 2015.  Marland Kitchen High cholesterol     Past Surgical History:  Procedure Laterality Date  . PARS PLANA VITRECTOMY Left 2015   WITH MEMBRANE PEEL, 23 GAUGE  . TRANSURETHRAL RESECTION OF PROSTATE    . Urethral stricture surgery      Current Medications: No outpatient medications have been marked as taking for the 11/13/18 encounter (Appointment) with Imogene Burn, PA-C.     Allergies:   Sulfa antibiotics; Sulfamethoxazole; and Sulfasalazine   Social History   Socioeconomic History  . Marital status: Married    Spouse name: June  . Number of children: Not on file  . Years of education: Not on file  . Highest education level: Not on file  Occupational History  . Not on file  Social Needs  . Financial resource strain: Not on  file  . Food insecurity:    Worry: Not on file    Inability: Not on file  . Transportation needs:    Medical: Not on file    Non-medical: Not on file  Tobacco Use  . Smoking status: Never Smoker  . Smokeless tobacco: Never Used  Substance and Sexual Activity  . Alcohol use: Yes    Comment: Once per month  . Drug use: No  . Sexual activity: Not on file  Lifestyle  . Physical activity:    Days per week: Not on file    Minutes per session: Not on file  . Stress: Not on file  Relationships  . Social connections:    Talks on phone: Not on file    Gets together: Not on file    Attends religious service: Not on file    Active member of club or organization: Not on file    Attends meetings of clubs or organizations: Not on file    Relationship status: Not on file  Other Topics Concern  . Not on file  Social History Narrative  . Not on file     Family History:  The patient's ***family history includes Dementia in his brother; Other in his mother; Stroke in his brother and father; Tuberculosis in his paternal grandfather.   ROS:  Please see the history of present illness.    ROS All other systems reviewed and are negative.   PHYSICAL EXAM:   VS:  There were no vitals taken for this visit.  Physical Exam  GEN: Well nourished, well developed, in no acute distress  HEENT: normal  Neck: no JVD, carotid bruits, or masses Cardiac:RRR; no murmurs, rubs, or gallops  Respiratory:  clear to auscultation bilaterally, normal work of breathing GI: soft, nontender, nondistended, + BS Ext: without cyanosis, clubbing, or edema, Good distal pulses bilaterally MS: no deformity or atrophy  Skin: warm and dry, no rash Neuro:  Alert and Oriented x 3, Strength and sensation are intact Psych: euthymic mood, full affect  Wt Readings from Last 3 Encounters:  10/31/18 195 lb (88.5 kg)  09/12/18 194 lb 3.2 oz (88.1 kg)  09/06/18 193 lb (87.5 kg)      Studies/Labs Reviewed:   EKG:  EKG  is*** ordered today.  The ekg ordered today demonstrates ***  Recent Labs: 10/31/2018: BUN 16; Creatinine, Ser 1.01; Hemoglobin 13.6; Platelets 238; Potassium 4.2; Sodium 138   Lipid Panel    Component Value Date/Time   CHOL 113 10/03/2013 0749   TRIG 72.0 10/03/2013 0749   HDL 50.50 10/03/2013 0749   CHOLHDL 2 10/03/2013 0749   VLDL 14.4 10/03/2013 0749   LDLCALC 48 10/03/2013 0749    Additional studies/ records that were reviewed today include:  ***    ASSESSMENT:    No diagnosis found.   PLAN:  In order of problems listed above:      Medication Adjustments/Labs and Tests Ordered: Current medicines are reviewed at length with the patient today.  Concerns regarding medicines are outlined above.  Medication changes, Labs and Tests ordered today are listed in the Patient Instructions below. There are no Patient Instructions on file for this visit.   Sumner Boast, PA-C  11/13/2018 10:00 AM    [facility] [place], [place], [place]  [address] Phone: [phone]; Fax: [phone]

## 2018-11-13 NOTE — Telephone Encounter (Signed)
Per Ermalinda Barrios, PA patient needs follow up to be rescheduled in 6 weeks. Will route to [employer].

## 2018-12-11 NOTE — Telephone Encounter (Signed)
Virtual Visit Pre-Appointment Phone Call   TELEPHONE CALL NOTE  [name] has been deemed a candidate for a follow-up tele-health visit to limit community exposure during the Covid-19 pandemic. I spoke with the patient via phone to ensure availability of phone/video source, confirm preferred email & phone number, and discuss instructions and expectations.  I reminded [name] to be prepared with any vital sign and/or heart rhythm information that could potentially be obtained via home monitoring, at the time of his visit. I reminded [name] to expect a phone call at the time of his visit if his visit.   Patient agrees to consent below.  Cleon Gustin, RN 12/11/2018 2:43 PM    FULL LENGTH CONSENT FOR TELE-HEALTH VISIT   I hereby voluntarily request, consent and authorize [facility] and its employed or contracted physicians, physician assistants, nurse practitioners or other licensed health care professionals (the Practitioner), to provide me with telemedicine health care services (the "Services") as deemed necessary by the treating Practitioner. I acknowledge and consent to receive the Services by the Practitioner via telemedicine. I understand that the telemedicine visit will involve communicating with the Practitioner through live audiovisual communication technology and the disclosure of certain medical information by electronic transmission. I acknowledge that I have been given the opportunity to request an in-person assessment or other available alternative prior to the telemedicine visit and am voluntarily participating in the telemedicine visit.  I understand that I have the right to withhold or withdraw my consent to the use of telemedicine in the course of my care at any time, without affecting my right to future care or treatment, and that the Practitioner or I may terminate the telemedicine visit at any time. I understand that I have the right to inspect all  information obtained and/or recorded in the course of the telemedicine visit and may receive copies of available information for a reasonable fee.  I understand that some of the potential risks of receiving the Services via telemedicine include:  Marland Kitchen Delay or interruption in medical evaluation due to technological equipment failure or disruption; . Information transmitted may not be sufficient (e.g. poor resolution of images) to allow for appropriate medical decision making by the Practitioner; and/or  . In rare instances, security protocols could fail, causing a breach of personal health information.  Furthermore, I acknowledge that it is my responsibility to provide information about my medical history, conditions and care that is complete and accurate to the best of my ability. I acknowledge that Practitioner's advice, recommendations, and/or decision may be based on factors not within their control, such as incomplete or inaccurate data provided by me or distortions of diagnostic images or specimens that may result from electronic transmissions. I understand that the practice of medicine is not an exact science and that Practitioner makes no warranties or guarantees regarding treatment outcomes. I acknowledge that I will receive a copy of this consent concurrently upon execution via email to the email address I last provided but may also request a printed copy by calling the office of [facility].    I understand that my insurance will be billed for this visit.   I have read or had this consent read to me. . I understand the contents of this consent, which adequately explains the benefits and risks of the Services being provided via telemedicine.  . I have been provided ample opportunity to ask questions regarding this consent and the Services and have had  my questions answered to my satisfaction. . I give my informed consent for the services to be provided through the use of telemedicine in my  medical care  By participating in this telemedicine visit I agree to the above.

## 2018-12-13 NOTE — Progress Notes (Signed)
Virtual Visit via Telephone Note   This visit type was conducted due to national recommendations for restrictions regarding the COVID-19 Pandemic (e.g. social distancing) in an effort to limit this patient's exposure and mitigate transmission in our community.  Due to his co-morbid illnesses, this patient is at least at moderate risk for complications without adequate follow up.  This format is felt to be most appropriate for this patient at this time.  The patient did not have access to video technology/had technical difficulties with video requiring transitioning to audio format only (telephone).  All issues noted in this document were discussed and addressed.  No physical exam could be performed with this format.  Please refer to the patient's chart for his  consent to telehealth for [facility].   Evaluation Performed:  Follow-up visit  Date:  12/18/2018   [name], [name] 1940-11-07, MRN [id number]  Patient Location: Home Provider Location: Home  PCP:  Leanna Battles, MD  Cardiologist:  Ena Dawley, MD   Electrophysiologist:  None   Chief Complaint:  F/U  History of Present Illness:    [name] is a 78 y.o. male with history of chest pain and moderate nonobstructive CAD on coronary CT in 2015.  Statins were stopped because of memory problems.   I saw the patient in the office 10/31/2018 with atypical chest pain that was reproducible to touch on exam and worse when lifting weights with a trainer.  Chest pain was constant for several months and did not worsen when exercising on the treadmill or elliptical.  D dimer was Normal as was CBC and C met.  11/02/18 showed normal LVEF 65% with some diastolic dysfunction.  Chest pain much better. Not exercising with a trainer anymore. Doing yard work, Psychologist, counselling around the yard.   The patient does not have symptoms concerning for COVID-19 infection (fever, chills, cough, or new shortness of breath).    Past Medical History:   Diagnosis Date  . Acid reflux    a. well-controlled with ranitidine.  Marland Kitchen BPH (benign prostatic hyperplasia)    a. s/p TURP 2012.  . Essential hypertension 09/10/2013  . Eye problem    a. L eye blurred vision - seeing eye doctor for tissue pulling away from eye.  . Hard of hearing   . Hiatal hernia    a. Small-mod by CXR 2015.  Marland Kitchen High cholesterol    Past Surgical History:  Procedure Laterality Date  . PARS PLANA VITRECTOMY Left 2015   WITH MEMBRANE PEEL, 23 GAUGE  . TRANSURETHRAL RESECTION OF PROSTATE    . Urethral stricture surgery       Current Meds  Medication Sig  . acetaminophen (TYLENOL) 500 MG tablet Take 1,000 mg by mouth every 6 (six) hours as needed for mild pain or moderate pain.  Marland Kitchen aspirin 81 MG tablet Take 81 mg by mouth daily.  . Calcium Carbonate-Vitamin D (CALTRATE 600+D PO) Take 1 tablet by mouth daily.  Marland Kitchen COENZYME Q-10 PO Take 1 tablet by mouth daily.  Marland Kitchen desmopressin (DDAVP) 0.2 MG tablet   . FERREX 150 150 MG capsule Take 150 mg by mouth daily.  Marland Kitchen gabapentin (NEURONTIN) 100 MG capsule TAKE 1 CAPSULE BY MOUTH EVERYDAY AT BEDTIME  . meclizine (ANTIVERT) 25 MG tablet Take 25 mg by mouth daily.  . memantine (NAMENDA) 5 MG tablet Take 1 tablet by mouth 2 (two) times daily.  . Multiple Vitamins-Minerals (CENTRUM SILVER PO) Take 1 tablet by mouth daily.  Marland Kitchen  Omega-3 Fatty Acids (FISH OIL) 1000 MG CAPS Take 1 capsule (1,000 mg total) by mouth daily.  Vladimir Faster Glycol-Propyl Glycol (SYSTANE OP) Apply 2 drops to eye daily as needed (Dry eyes). 2 drops each eye  . ranitidine (ZANTAC) 150 MG tablet Take 150 mg by mouth 2 (two) times daily.  . Red Yeast Rice 600 MG CAPS Take 1 capsule (600 mg total) by mouth daily.  Marland Kitchen rOPINIRole (REQUIP) 1 MG tablet Take 1 tablet by mouth at bedtime.  Marland Kitchen VITAMIN E PO Take 1 capsule by mouth 2 (two) times daily.     Allergies:   Sulfa antibiotics; Sulfamethoxazole; and Sulfasalazine   Social History   Tobacco Use  . Smoking status:  Never Smoker  . Smokeless tobacco: Never Used  Substance Use Topics  . Alcohol use: Yes    Comment: Once per month  . Drug use: No     Family Hx: The patient's family history includes Dementia in his brother; Other in his mother; Stroke in his brother and father; Tuberculosis in his paternal grandfather. There is no history of CAD.  ROS:   Please see the history of present illness.    Review of Systems  HENT:       Seasonal allergies   All other systems reviewed and are negative.   Prior CV studies:   The following studies were reviewed today:  2D echo 11/02/2018 IMPRESSIONS      1. The left ventricle has hyperdynamic systolic function, with an ejection fraction of >65%. The cavity size was normal. There is mildly increased left ventricular wall thickness. Left ventricular diastolic Doppler parameters are consistent with  impaired relaxation.  2. The right ventricle has normal systolic function. The cavity was normal. There is no increase in right ventricular wall thickness.  3. The mitral valve is normal in structure.  4. The tricuspid valve is normal in structure.  5. The aortic valve is tricuspid.   FINDINGS Coronary CTA 2015 FINDINGS: Coronary Arteries:  Originating in the normal position.   Left main has only minimal atherosclerotic plague. LM gives rise to LAD, small ramus intermedius and non-dominant LCX.   Ostial LAD has a mild mixed plague with 0-25% stenosis. LAD gives rise to a large diagonal branch (almost like dual LAD).   Proximal and mid LAD has a long moderate circumferential calcified plague with associated 25-50% stenosis. Distal LAD has only mild luminal irregularities.   First diagonal branch has calcified plague with associated 0-25% stenosis.   LCX is medium size non-dominant vessel. There is mild calcified plague in its mid segment associated with 0-25% stenosis.   RCA is a large dominant vessel. There is mild noncalcified plague in the mid  RCA associated with 25-50% stenosis. Distal RCA has minimal calcified plague associated with <25% stenosis.   RCA gives rise to a large acute marginal branch that supplies PDA territory and a large PLVB. They both have only minimal calcified plague with associated 0-25% stenosis.   IMPRESSION: 1. Moderate diffuse non-obstructive CAD. Aggressive medical management is recommended.   Ena Dawley     Electronically Signed   By: Jenkins Rouge M.D.   On: 09/05/2013 17:49   Labs/Other Tests and Data Reviewed:    EKG:  An ECG dated 10/31/18 was personally reviewed today and demonstrated:  NSR at 69/m  Recent Labs: 10/31/2018: BUN 16; Creatinine, Ser 1.01; Hemoglobin 13.6; Platelets 238; Potassium 4.2; Sodium 138   Recent Lipid Panel Lab Results  Component Value Date/Time  CHOL 113 10/03/2013 07:49 AM   TRIG 72.0 10/03/2013 07:49 AM   HDL 50.50 10/03/2013 07:49 AM   CHOLHDL 2 10/03/2013 07:49 AM   LDLCALC 48 10/03/2013 07:49 AM    Wt Readings from Last 3 Encounters:  12/18/18 183 lb (83 kg)  10/31/18 195 lb (88.5 kg)  09/12/18 194 lb 3.2 oz (88.1 kg)     Objective:    Vital Signs:  BP 128/65 (BP Location: Right Arm)   Pulse (!) 47   Ht 5' 10"  (1.778 m)   Wt 183 lb (83 kg)   BMI 26.26 kg/m       ASSESSMENT & PLAN:    1. Musculoskeletal chest pain resolved when he stopped lifting weights, 2D echo normal LVEF with some diastolic dysfunction 2. Moderate diffuse nonobstructive CAD on CT in 2015 3. Hyperlipidemia on fish oil and red yeast rice, statin stopped because of memory loss-LDL 113, HDL 52 in 03/2018-recheck in August with f/u with Dr. [name] 4. Bradycardia on BP cuff at home HR in 40's-asymptomatic and not on any HR lowering meds. HR 69 at OV in March.  COVID-19 Education: The signs and symptoms of COVID-19 were discussed with the patient and how to seek care for testing (follow up with PCP or arrange E-visit).   The importance of social distancing was discussed  today.  Time:   Today, I have spent 11:10 minutes with the patient with telehealth technology discussing the above problems.     Medication Adjustments/Labs and Tests Ordered: Current medicines are reviewed at length with the patient today.  Concerns regarding medicines are outlined above.   Tests Ordered: Orders Placed This Encounter  Procedures  . Lipid panel    Medication Changes: No orders of the defined types were placed in this encounter.   Disposition:  Follow up in 6 month(s) with Dr. [name]  Signed, Ermalinda Barrios, PA-C  12/18/2018 8:39 AM    [place]

## 2018-12-18 ENCOUNTER — Encounter: Payer: Self-pay | Admitting: Physician Assistant

## 2018-12-18 ENCOUNTER — Other Ambulatory Visit: Payer: Self-pay

## 2018-12-18 ENCOUNTER — Telehealth (INDEPENDENT_AMBULATORY_CARE_PROVIDER_SITE_OTHER): Payer: Medicare Other | Admitting: Physician Assistant

## 2018-12-18 VITALS — BP 128/65 | HR 47 | Ht 70.0 in | Wt 183.0 lb

## 2018-12-18 DIAGNOSIS — I2510 Atherosclerotic heart disease of native coronary artery without angina pectoris: Secondary | ICD-10-CM

## 2018-12-18 DIAGNOSIS — E785 Hyperlipidemia, unspecified: Secondary | ICD-10-CM

## 2018-12-18 DIAGNOSIS — R079 Chest pain, unspecified: Secondary | ICD-10-CM | POA: Diagnosis not present

## 2018-12-18 NOTE — Patient Instructions (Signed)
Medication Instructions:  Your physician recommends that you continue on your current medications as directed. Please refer to the Current Medication list given to you today.  If you need a refill on your cardiac medications before your next appointment, please call your pharmacy.   Lab work: Your physician recommends that you return for a FASTING lipid profile in 6 months on the same day that you see Dr. [name]  If you have labs (blood work) drawn today and your tests are completely normal, you will receive your results only by: Marland Kitchen MyChart Message (if you have MyChart) OR . A paper copy in the mail If you have any lab test that is abnormal or we need to change your treatment, we will call you to review the results.  Testing/Procedures: None ordered  Follow-Up: At [facility], you and your health needs are our priority.  As part of our continuing mission to provide you with exceptional heart care, we have created designated Provider Care Teams.  These Care Teams include your primary Cardiologist (physician) and Advanced Practice Providers (APPs -  Physician Assistants and Nurse Practitioners) who all work together to provide you with the care you need, when you need it. . You will need a follow up appointment in 6 months.  Please call our office 2 months in advance to schedule this appointment.  You may see Ena Dawley, MD or one of the following Advanced Practice Providers on your designated Care Team:   . Lyda Jester, PA-C . Dayna Dunn, PA-C . Ermalinda Barrios, PA-C  Any Other Special Instructions Will Be Listed Below (If Applicable).

## 2019-01-28 DIAGNOSIS — G4733 Obstructive sleep apnea (adult) (pediatric): Secondary | ICD-10-CM | POA: Diagnosis not present

## 2019-01-28 DIAGNOSIS — G2581 Restless legs syndrome: Secondary | ICD-10-CM | POA: Diagnosis not present

## 2019-01-28 DIAGNOSIS — I10 Essential (primary) hypertension: Secondary | ICD-10-CM | POA: Diagnosis not present

## 2019-01-28 DIAGNOSIS — E785 Hyperlipidemia, unspecified: Secondary | ICD-10-CM | POA: Diagnosis not present

## 2019-01-28 DIAGNOSIS — R5383 Other fatigue: Secondary | ICD-10-CM | POA: Diagnosis not present

## 2019-01-28 DIAGNOSIS — M25551 Pain in right hip: Secondary | ICD-10-CM | POA: Diagnosis not present

## 2019-02-12 DIAGNOSIS — L570 Actinic keratosis: Secondary | ICD-10-CM | POA: Diagnosis not present

## 2019-02-12 DIAGNOSIS — Z85828 Personal history of other malignant neoplasm of skin: Secondary | ICD-10-CM | POA: Diagnosis not present

## 2019-02-12 DIAGNOSIS — L821 Other seborrheic keratosis: Secondary | ICD-10-CM | POA: Diagnosis not present

## 2019-02-12 DIAGNOSIS — D1801 Hemangioma of skin and subcutaneous tissue: Secondary | ICD-10-CM | POA: Diagnosis not present

## 2019-02-12 DIAGNOSIS — L814 Other melanin hyperpigmentation: Secondary | ICD-10-CM | POA: Diagnosis not present

## 2019-02-13 DIAGNOSIS — M545 Low back pain: Secondary | ICD-10-CM | POA: Diagnosis not present

## 2019-02-13 DIAGNOSIS — M25551 Pain in right hip: Secondary | ICD-10-CM | POA: Diagnosis not present

## 2019-02-20 DIAGNOSIS — M545 Low back pain: Secondary | ICD-10-CM | POA: Diagnosis not present

## 2019-02-20 DIAGNOSIS — M25551 Pain in right hip: Secondary | ICD-10-CM | POA: Diagnosis not present

## 2019-02-22 DIAGNOSIS — M25551 Pain in right hip: Secondary | ICD-10-CM | POA: Diagnosis not present

## 2019-02-22 DIAGNOSIS — M545 Low back pain: Secondary | ICD-10-CM | POA: Diagnosis not present

## 2019-02-27 DIAGNOSIS — M545 Low back pain: Secondary | ICD-10-CM | POA: Diagnosis not present

## 2019-02-27 DIAGNOSIS — M25551 Pain in right hip: Secondary | ICD-10-CM | POA: Diagnosis not present

## 2019-02-27 DIAGNOSIS — N400 Enlarged prostate without lower urinary tract symptoms: Secondary | ICD-10-CM | POA: Diagnosis not present

## 2019-02-27 DIAGNOSIS — N35011 Post-traumatic bulbous urethral stricture: Secondary | ICD-10-CM | POA: Diagnosis not present

## 2019-03-05 DIAGNOSIS — M25551 Pain in right hip: Secondary | ICD-10-CM | POA: Diagnosis not present

## 2019-03-05 DIAGNOSIS — M545 Low back pain: Secondary | ICD-10-CM | POA: Diagnosis not present

## 2019-03-15 DIAGNOSIS — M545 Low back pain: Secondary | ICD-10-CM | POA: Diagnosis not present

## 2019-03-15 DIAGNOSIS — M25551 Pain in right hip: Secondary | ICD-10-CM | POA: Diagnosis not present

## 2019-04-15 DIAGNOSIS — H903 Sensorineural hearing loss, bilateral: Secondary | ICD-10-CM | POA: Diagnosis not present

## 2019-05-02 DIAGNOSIS — Z23 Encounter for immunization: Secondary | ICD-10-CM | POA: Diagnosis not present

## 2019-05-30 DIAGNOSIS — M25551 Pain in right hip: Secondary | ICD-10-CM | POA: Diagnosis not present

## 2019-05-30 DIAGNOSIS — R300 Dysuria: Secondary | ICD-10-CM | POA: Diagnosis not present

## 2019-05-30 DIAGNOSIS — M533 Sacrococcygeal disorders, not elsewhere classified: Secondary | ICD-10-CM | POA: Diagnosis not present

## 2019-05-30 DIAGNOSIS — E7849 Other hyperlipidemia: Secondary | ICD-10-CM | POA: Diagnosis not present

## 2019-05-30 DIAGNOSIS — N410 Acute prostatitis: Secondary | ICD-10-CM | POA: Diagnosis not present

## 2019-05-30 DIAGNOSIS — N390 Urinary tract infection, site not specified: Secondary | ICD-10-CM | POA: Diagnosis not present

## 2019-06-06 DIAGNOSIS — Z961 Presence of intraocular lens: Secondary | ICD-10-CM | POA: Diagnosis not present

## 2019-06-06 DIAGNOSIS — H35372 Puckering of macula, left eye: Secondary | ICD-10-CM | POA: Diagnosis not present

## 2019-06-11 DIAGNOSIS — M25551 Pain in right hip: Secondary | ICD-10-CM | POA: Diagnosis not present

## 2019-06-11 DIAGNOSIS — M7061 Trochanteric bursitis, right hip: Secondary | ICD-10-CM | POA: Diagnosis not present

## 2019-06-24 DIAGNOSIS — M25551 Pain in right hip: Secondary | ICD-10-CM | POA: Diagnosis not present

## 2019-06-26 DIAGNOSIS — R509 Fever, unspecified: Secondary | ICD-10-CM | POA: Diagnosis not present

## 2019-06-26 DIAGNOSIS — B349 Viral infection, unspecified: Secondary | ICD-10-CM | POA: Diagnosis not present

## 2019-06-26 DIAGNOSIS — R5383 Other fatigue: Secondary | ICD-10-CM | POA: Diagnosis not present

## 2019-06-26 DIAGNOSIS — R0609 Other forms of dyspnea: Secondary | ICD-10-CM | POA: Diagnosis not present

## 2019-06-26 DIAGNOSIS — Z20818 Contact with and (suspected) exposure to other bacterial communicable diseases: Secondary | ICD-10-CM | POA: Diagnosis not present

## 2019-06-26 DIAGNOSIS — R0789 Other chest pain: Secondary | ICD-10-CM | POA: Diagnosis not present

## 2019-07-01 NOTE — Progress Notes (Signed)
Virtual Visit via Telephone Note   This visit type was conducted due to national recommendations for restrictions regarding the COVID-19 Pandemic (e.g. social distancing) in an effort to limit this patient's exposure and mitigate transmission in our community.  Due to his co-morbid illnesses, this patient is at least at moderate risk for complications without adequate follow up.  This format is felt to be most appropriate for this patient at this time.  The patient did not have access to video technology/had technical difficulties with video requiring transitioning to audio format only (telephone).  All issues noted in this document were discussed and addressed.  No physical exam could be performed with this format.  Please refer to the patient's chart for his  consent to telehealth for [facility].   Date:  07/03/2019   [name], DOB Jan 26, 1941, MRN [id number]  Patient Location: Home Provider Location: Home  PCP:  Donald Battles, MD  Cardiologist:  Donald Dawley, MD   Electrophysiologist:  None   Evaluation Performed:  Follow-Up Visit  Chief Complaint: chest soreness  History of Present Illness:    [name] is a 79 y.o. male with history of chest pain and moderate nonobstructive CAD on coronary CT in 2015.  Statins were stopped because of memory problems.   I saw the patient in the office 10/31/2018 with atypical chest pain that was reproducible to touch on exam and worse when lifting weights with a trainer.  Chest pain was constant for several months and did not worsen when exercising on the treadmill or elliptical.  D dimer was Normal as was CBC and C met.  11/02/18 showed normal LVEF 65% with some diastolic dysfunction.   Telemedicine visit with patient 12/18/2018 chest pain had resolved after he stopped working with a trainer.  Said his heart rate was in the 40s at home when he took his blood pressure but he was asymptomatic.  No changes made.  Patient is referred  back by PCP because of recurrent chest pain and a heart rate of 47. Patient was having cough and shortness of breath and tested for covid which was negative. Complains constant uncomfortable feeling under left nipple. It's been going on for weeks. Working with a Clinical research associate again and doing upper body work Working with PT for hip problems. Chest is tender to touch and sore similar to when I saw him before. EKG reviewed from 06/26/19 sinus bradycardia 47/m no acute change.  The patient does not have symptoms concerning for COVID-19 infection (fever, chills, cough, or new shortness of breath).    Past Medical History:  Diagnosis Date  . Acid reflux    a. well-controlled with ranitidine.  Marland Kitchen BPH (benign prostatic hyperplasia)    a. s/p TURP 2012.  . Essential hypertension 09/10/2013  . Eye problem    a. L eye blurred vision - seeing eye doctor for tissue pulling away from eye.  . Hard of hearing   . Hiatal hernia    a. Small-mod by CXR 2015.  Marland Kitchen High cholesterol    Past Surgical History:  Procedure Laterality Date  . PARS PLANA VITRECTOMY Left 2015   WITH MEMBRANE PEEL, 23 GAUGE  . TRANSURETHRAL RESECTION OF PROSTATE    . Urethral stricture surgery       Current Meds  Medication Sig  . acetaminophen (TYLENOL) 500 MG tablet Take 1,000 mg by mouth every 6 (six) hours as needed for mild pain or moderate pain.  Marland Kitchen aspirin 81 MG  tablet Take 81 mg by mouth daily.  . Calcium Carbonate-Vitamin D (CALTRATE 600+D PO) Take 1 tablet by mouth daily.  Marland Kitchen COENZYME Q-10 PO Take 1 tablet by mouth daily.  Marland Kitchen desmopressin (DDAVP) 0.2 MG tablet   . FERREX 150 150 MG capsule Take 150 mg by mouth daily.  Marland Kitchen gabapentin (NEURONTIN) 100 MG capsule TAKE 1 CAPSULE BY MOUTH EVERYDAY AT BEDTIME  . meclizine (ANTIVERT) 25 MG tablet Take 25 mg by mouth daily.  . memantine (NAMENDA) 5 MG tablet Take 1 tablet by mouth 2 (two) times daily.  . Multiple Vitamins-Minerals (CENTRUM SILVER PO) Take 1 tablet by mouth daily.  .  Omega-3 Fatty Acids (FISH OIL) 1000 MG CAPS Take 1 capsule (1,000 mg total) by mouth daily.  Vladimir Faster Glycol-Propyl Glycol (SYSTANE OP) Apply 2 drops to eye daily as needed (Dry eyes). 2 drops each eye  . Red Yeast Rice 600 MG CAPS Take 1 capsule (600 mg total) by mouth daily.  Marland Kitchen rOPINIRole (REQUIP) 1 MG tablet Take 1 tablet by mouth at bedtime.     Allergies:   Sulfa antibiotics, Sulfamethoxazole, and Sulfasalazine   Social History   Tobacco Use  . Smoking status: Never Smoker  . Smokeless tobacco: Never Used  Substance Use Topics  . Alcohol use: Yes    Comment: Once per month  . Drug use: No     Family Hx: The patient's family history includes Dementia in his brother; Other in his mother; Stroke in his brother and father; Tuberculosis in his paternal grandfather. There is no history of CAD.  ROS:   Please see the history of present illness.      All other systems reviewed and are negative.   Prior CV studies:   The following studies were reviewed today:  2D echo 11/02/2018 IMPRESSIONS      1. The left ventricle has hyperdynamic systolic function, with an ejection fraction of >65%. The cavity size was normal. There is mildly increased left ventricular wall thickness. Left ventricular diastolic Doppler parameters are consistent with  impaired relaxation.  2. The right ventricle has normal systolic function. The cavity was normal. There is no increase in right ventricular wall thickness.  3. The mitral valve is normal in structure.  4. The tricuspid valve is normal in structure.  5. The aortic valve is tricuspid.   FINDINGS Coronary CTA 2015 FINDINGS: Coronary Arteries:  Originating in the normal position.   Left main has only minimal atherosclerotic plague. LM gives rise to LAD, small ramus intermedius and non-dominant LCX.   Ostial LAD has a mild mixed plague with 0-25% stenosis. LAD gives rise to a large diagonal branch (almost like dual LAD).   Proximal and  mid LAD has a long moderate circumferential calcified plague with associated 25-50% stenosis. Distal LAD has only mild luminal irregularities.   First diagonal branch has calcified plague with associated 0-25% stenosis.   LCX is medium size non-dominant vessel. There is mild calcified plague in its mid segment associated with 0-25% stenosis.   RCA is a large dominant vessel. There is mild noncalcified plague in the mid RCA associated with 25-50% stenosis. Distal RCA has minimal calcified plague associated with <25% stenosis.   RCA gives rise to a large acute marginal branch that supplies PDA territory and a large PLVB. They both have only minimal calcified plague with associated 0-25% stenosis.   IMPRESSION: 1. Moderate diffuse non-obstructive CAD. Aggressive medical management is recommended.   [name]  Electronically Signed   By: Jenkins Rouge M.D.   On: 09/05/2013 17:49   Labs/Other Tests and Data Reviewed:    EKG:  An ECG dated 06/26/19 was personally reviewed today and demonstrated:  Sinus bradycardia at 47/m no acute change  Recent Labs: 10/31/2018: BUN 16; Creatinine, Ser 1.01; Hemoglobin 13.6; Platelets 238; Potassium 4.2; Sodium 138   Recent Lipid Panel Lab Results  Component Value Date/Time   CHOL 113 10/03/2013 07:49 AM   TRIG 72.0 10/03/2013 07:49 AM   HDL 50.50 10/03/2013 07:49 AM   CHOLHDL 2 10/03/2013 07:49 AM   LDLCALC 48 10/03/2013 07:49 AM    Wt Readings from Last 3 Encounters:  12/18/18 183 lb (83 kg)  10/31/18 195 lb (88.5 kg)  09/12/18 194 lb 3.2 oz (88.1 kg)     Objective:    Vital Signs:  BP 119/68   Pulse (!) 56   Temp (!) 97 F (36.1 C)   Ht 5' 10" (1.778 m)   BMI 26.26 kg/m    VITAL SIGNS:  reviewed  ASSESSMENT & PLAN:    1. Chest pain with history of moderate diffuse nonobstructive CAD on CT in 2015. Chest pain constant and sore to the touch. Working with trainer again and similar to chest pain he had when working  with trainer in the past. Musculoskeletal. Recommend no upper body work and tylenol as needed.  2. Bradycardia in 40's with fatigue. Will order 30 day monitor and f/u with me in 6 weeks 3. Hyperlipidemia on fish oil and red yeast rice.  Statin stopped because of memory loss, LDL 113  August 2019 needs repeat  COVID-19 Education: The signs and symptoms of COVID-19 were discussed with the patient and how to seek care for testing (follow up with PCP or arrange E-visit).   The importance of social distancing was discussed today.  Time:   Today, I have spent 15:40 minutes with the patient with telehealth technology discussing the above problems.     Medication Adjustments/Labs and Tests Ordered: Current medicines are reviewed at length with the patient today.  Concerns regarding medicines are outlined above.   Tests Ordered: Orders Placed This Encounter  Procedures  . Lipid panel  . CARDIAC EVENT MONITOR    Medication Changes: No orders of the defined types were placed in this encounter.   Follow Up:  Virtual Visit  in 6 week(s) Ermalinda Barrios PA-C  Signed, Ermalinda Barrios, PA-C  07/03/2019 9:39 AM    [facility]

## 2019-07-03 ENCOUNTER — Other Ambulatory Visit: Payer: Self-pay

## 2019-07-03 ENCOUNTER — Telehealth (INDEPENDENT_AMBULATORY_CARE_PROVIDER_SITE_OTHER): Payer: Medicare Other | Admitting: Physician Assistant

## 2019-07-03 ENCOUNTER — Encounter: Payer: Self-pay | Admitting: Physician Assistant

## 2019-07-03 VITALS — BP 119/68 | HR 56 | Temp 97.0°F | Ht 70.0 in

## 2019-07-03 DIAGNOSIS — I2510 Atherosclerotic heart disease of native coronary artery without angina pectoris: Secondary | ICD-10-CM | POA: Diagnosis not present

## 2019-07-03 DIAGNOSIS — R072 Precordial pain: Secondary | ICD-10-CM | POA: Diagnosis not present

## 2019-07-03 DIAGNOSIS — R001 Bradycardia, unspecified: Secondary | ICD-10-CM

## 2019-07-03 DIAGNOSIS — R300 Dysuria: Secondary | ICD-10-CM | POA: Diagnosis not present

## 2019-07-03 DIAGNOSIS — E7849 Other hyperlipidemia: Secondary | ICD-10-CM | POA: Diagnosis not present

## 2019-07-03 NOTE — Patient Instructions (Addendum)
Medication Instructions:  Your physician recommends that you continue on your current medications as directed. Please refer to the Current Medication list given to you today.  If you need a refill on your cardiac medications before your next appointment, please call your pharmacy.   Lab work: Keep appointment for FASTING lipid profile on 09/25/19  If you have labs (blood work) drawn today and your tests are completely normal, you will receive your results only by: Marland Kitchen MyChart Message (if you have MyChart) OR . A paper copy in the mail If you have any lab test that is abnormal or we need to change your treatment, we will call you to review the results.  Testing/Procedures: Your physician has recommended that you wear an event monitor. Event monitors are medical devices that record the heart's electrical activity. Doctors most often [place] these monitors to diagnose arrhythmias. Arrhythmias are problems with the speed or rhythm of the heartbeat. The monitor is a small, portable device. You can wear one while you do your normal daily activities. This is usually used to diagnose what is causing palpitations/syncope (passing out).  Follow-Up: . Follow up with Ermalinda Barrios, PA via VIRTUAL VISIT on 08/14/19 at 1:30  Any Other Special Instructions Will Be Listed Below (If Applicable).  Stop doing upper body work with Therapist, nutritional

## 2019-07-08 DIAGNOSIS — M25551 Pain in right hip: Secondary | ICD-10-CM | POA: Diagnosis not present

## 2019-07-10 DIAGNOSIS — M25551 Pain in right hip: Secondary | ICD-10-CM | POA: Diagnosis not present

## 2019-07-12 ENCOUNTER — Telehealth: Payer: Self-pay

## 2019-07-12 NOTE — Telephone Encounter (Signed)
LM with monitor instructions. 30 day Event monitor ordered.

## 2019-07-15 DIAGNOSIS — M25551 Pain in right hip: Secondary | ICD-10-CM | POA: Diagnosis not present

## 2019-07-22 DIAGNOSIS — M25551 Pain in right hip: Secondary | ICD-10-CM | POA: Diagnosis not present

## 2019-07-23 ENCOUNTER — Ambulatory Visit (INDEPENDENT_AMBULATORY_CARE_PROVIDER_SITE_OTHER): Payer: Medicare Other

## 2019-07-23 DIAGNOSIS — R001 Bradycardia, unspecified: Secondary | ICD-10-CM

## 2019-07-29 DIAGNOSIS — M25551 Pain in right hip: Secondary | ICD-10-CM | POA: Diagnosis not present

## 2019-08-06 DIAGNOSIS — M25551 Pain in right hip: Secondary | ICD-10-CM | POA: Diagnosis not present

## 2019-08-09 DIAGNOSIS — M25551 Pain in right hip: Secondary | ICD-10-CM | POA: Diagnosis not present

## 2019-08-12 DIAGNOSIS — M7061 Trochanteric bursitis, right hip: Secondary | ICD-10-CM | POA: Diagnosis not present

## 2019-08-13 NOTE — Progress Notes (Signed)
Virtual Visit via Telephone Note   This visit type was conducted due to national recommendations for restrictions regarding the COVID-19 Pandemic (e.g. social distancing) in an effort to limit this patient's exposure and mitigate transmission in our community.  Due to his co-morbid illnesses, this patient is at least at moderate risk for complications without adequate follow up.  This format is felt to be most appropriate for this patient at this time.  The patient did not have access to video technology/had technical difficulties with video requiring transitioning to audio format only (telephone).  All issues noted in this document were discussed and addressed.  No physical exam could be performed with this format.  Please refer to the patient's chart for his  consent to telehealth for [facility].   Date:  08/14/2019   [name], DOB 11/26/1940, MRN [id number]  Patient Location: Home Provider Location: Home  PCP:  Leanna Battles, MD  Cardiologist:  Ena Dawley, MD   Electrophysiologist:  None   Evaluation Performed:  Follow-Up Visit  Chief Complaint:  Follow up  History of Present Illness:    [name] is a 78 y.o. male  with history of chest pain and moderate nonobstructive CAD on coronary CT in 2015.  Statins were stopped because of memory problems.   I saw the patient in the office 10/31/2018 with atypical chest pain that was reproducible to touch on exam and worse when lifting weights with a trainer.  Chest pain was constant for several months and did not worsen when exercising on the treadmill or elliptical.  D dimer was Normal as was CBC and C met.  11/02/18 showed normal LVEF 65% with some diastolic dysfunction.   Telemedicine visit with patient 12/18/2018 chest pain had resolved after he stopped working with a trainer.  Said his heart rate was in the 40s at home when he took his blood pressure but he was asymptomatic.  No changes made.   I saw the patient  07/03/2019 on referral back from PCP because of recurrent chest pain and heart rate of 47.  EKG was without acute change in chest pain appeared to be musculoskeletal.  I asked him to do no upper body work and use Tylenol.    I also ordered a 30-day monitor. 2 pages show sinus bradycardia 47 with some PSVT 170/m-full monitor not done.  Patient is still wearing the monitor. Complains of being tired and fatigued. Denies dizziness or presyncope. No further chest pain or shortness. Patient doesn't recall what he was doing at the time he had PSVT and was asymptomatic.   The patient does not have symptoms concerning for COVID-19 infection (fever, chills, cough, or new shortness of breath).    Past Medical History:  Diagnosis Date  . Acid reflux    a. well-controlled with ranitidine.  Marland Kitchen BPH (benign prostatic hyperplasia)    a. s/p TURP 2012.  . Essential hypertension 09/10/2013  . Eye problem    a. L eye blurred vision - seeing eye doctor for tissue pulling away from eye.  . Hard of hearing   . Hiatal hernia    a. Small-mod by CXR 2015.  Marland Kitchen High cholesterol    Past Surgical History:  Procedure Laterality Date  . PARS PLANA VITRECTOMY Left 2015   WITH MEMBRANE PEEL, 23 GAUGE  . TRANSURETHRAL RESECTION OF PROSTATE    . Urethral stricture surgery       Current Meds  Medication Sig  . acetaminophen (  TYLENOL) 500 MG tablet Take 1,000 mg by mouth every 6 (six) hours as needed for mild pain or moderate pain.  Marland Kitchen aspirin 81 MG tablet Take 81 mg by mouth daily.  . Calcium Carbonate-Vitamin D (CALTRATE 600+D PO) Take 1 tablet by mouth daily.  Marland Kitchen COENZYME Q-10 PO Take 1 tablet by mouth daily.  Marland Kitchen desmopressin (DDAVP) 0.2 MG tablet   . FERREX 150 150 MG capsule Take 150 mg by mouth daily.  Marland Kitchen gabapentin (NEURONTIN) 100 MG capsule TAKE 1 CAPSULE BY MOUTH EVERYDAY AT BEDTIME  . meclizine (ANTIVERT) 25 MG tablet Take 25 mg by mouth daily.  . memantine (NAMENDA) 5 MG tablet Take 1 tablet by mouth 2 (two)  times daily.  . Multiple Vitamins-Minerals (CENTRUM SILVER PO) Take 1 tablet by mouth daily.  . Omega-3 Fatty Acids (FISH OIL) 1000 MG CAPS Take 1 capsule (1,000 mg total) by mouth daily.  Vladimir Faster Glycol-Propyl Glycol (SYSTANE OP) Apply 2 drops to eye daily as needed (Dry eyes). 2 drops each eye  . Red Yeast Rice 600 MG CAPS Take 1 capsule (600 mg total) by mouth daily.  Marland Kitchen rOPINIRole (REQUIP) 1 MG tablet Take 1 tablet by mouth at bedtime.     Allergies:   Sulfa antibiotics, Sulfamethoxazole, and Sulfasalazine   Social History   Tobacco Use  . Smoking status: Never Smoker  . Smokeless tobacco: Never Used  Substance Use Topics  . Alcohol use: Yes    Comment: Once per month  . Drug use: No     Family Hx: The patient's family history includes Dementia in his brother; Other in his mother; Stroke in his brother and father; Tuberculosis in his paternal grandfather. There is no history of CAD.  ROS:   Please see the history of present illness.     All other systems reviewed and are negative.   Prior CV studies:   The following studies were reviewed today:  2D echo 11/02/2018 IMPRESSIONS      1. The left ventricle has hyperdynamic systolic function, with an ejection fraction of >65%. The cavity size was normal. There is mildly increased left ventricular wall thickness. Left ventricular diastolic Doppler parameters are consistent with  impaired relaxation.  2. The right ventricle has normal systolic function. The cavity was normal. There is no increase in right ventricular wall thickness.  3. The mitral valve is normal in structure.  4. The tricuspid valve is normal in structure.  5. The aortic valve is tricuspid.   FINDINGS Coronary CTA 2015 FINDINGS: Coronary Arteries:  Originating in the normal position.   Left main has only minimal atherosclerotic plague. LM gives rise to LAD, small ramus intermedius and non-dominant LCX.   Ostial LAD has a mild mixed plague with 0-25%  stenosis. LAD gives rise to a large diagonal branch (almost like dual LAD).   Proximal and mid LAD has a long moderate circumferential calcified plague with associated 25-50% stenosis. Distal LAD has only mild luminal irregularities.   First diagonal branch has calcified plague with associated 0-25% stenosis.   LCX is medium size non-dominant vessel. There is mild calcified plague in its mid segment associated with 0-25% stenosis.   RCA is a large dominant vessel. There is mild noncalcified plague in the mid RCA associated with 25-50% stenosis. Distal RCA has minimal calcified plague associated with <25% stenosis.   RCA gives rise to a large acute marginal branch that supplies PDA territory and a large PLVB. They both have only minimal calcified  plague with associated 0-25% stenosis.   IMPRESSION: 1. Moderate diffuse non-obstructive CAD. Aggressive medical management is recommended.   Ena Dawley     Electronically Signed   By: Jenkins Rouge M.D.   On: 09/05/2013 17:49     Labs/Other Tests and Data Reviewed:    EKG:  Monitor strips from 2 days of 30 day monitor reviewed. see above  Recent Labs: 10/31/2018: BUN 16; Creatinine, Ser 1.01; Hemoglobin 13.6; Platelets 238; Potassium 4.2; Sodium 138   Recent Lipid Panel Lab Results  Component Value Date/Time   CHOL 113 10/03/2013 07:49 AM   TRIG 72.0 10/03/2013 07:49 AM   HDL 50.50 10/03/2013 07:49 AM   CHOLHDL 2 10/03/2013 07:49 AM   LDLCALC 48 10/03/2013 07:49 AM    Wt Readings from Last 3 Encounters:  08/14/19 183 lb (83 kg)  12/18/18 183 lb (83 kg)  10/31/18 195 lb (88.5 kg)     Objective:    Vital Signs:  BP 135/65   Pulse (!) 43   Ht _0  (1.778 m)   Wt 183 lb (83 kg)   BMI 26.26 kg/m    VITAL SIGNS:  reviewed  ASSESSMENT & PLAN:    1. Chest pain with moderate diffuse nonobstructive CAD on CT in 2015.  Pain appeared to be musculoskeletal. No further chest pain 2. Bradycardia in the 40s with  fatigue.  Still wearing 30-day monitor. 07/29/19 had 8 sec SVT-asymptomatic. Has one more week to wear monitor. F/u with me after and refer to EPS. 3. Hyperlipidemia on fish oil and red yeast rice.  Statin stopped because of memory loss.  LDL 113 03/2018  COVID-19 Education: The signs and symptoms of COVID-19 were discussed with the patient and how to seek care for testing (follow up with PCP or arrange E-visit).   The importance of social distancing was discussed today.  Time:   Today, I have spent 13:30 minutes with the patient with telehealth technology discussing the above problems.     Medication Adjustments/Labs and Tests Ordered: Current medicines are reviewed at length with the patient today.  Concerns regarding medicines are outlined above.   Tests Ordered: Orders Placed This Encounter  Procedures  . Ambulatory referral to Cardiac Electrophysiology    Medication Changes: No orders of the defined types were placed in this encounter.   Follow Up:  Either In Person or Virtual in 3 week(s) Ermalinda Barrios PA-C and refer to EPS  Signed, Ermalinda Barrios, PA-C  08/14/2019 2:03 PM    [facility]

## 2019-08-14 ENCOUNTER — Telehealth (INDEPENDENT_AMBULATORY_CARE_PROVIDER_SITE_OTHER): Payer: Medicare Other | Admitting: Physician Assistant

## 2019-08-14 ENCOUNTER — Encounter: Payer: Self-pay | Admitting: Physician Assistant

## 2019-08-14 ENCOUNTER — Other Ambulatory Visit: Payer: Self-pay

## 2019-08-14 VITALS — BP 135/65 | HR 43 | Ht 70.0 in | Wt 183.0 lb

## 2019-08-14 DIAGNOSIS — R001 Bradycardia, unspecified: Secondary | ICD-10-CM | POA: Diagnosis not present

## 2019-08-14 DIAGNOSIS — E782 Mixed hyperlipidemia: Secondary | ICD-10-CM | POA: Diagnosis not present

## 2019-08-14 DIAGNOSIS — R079 Chest pain, unspecified: Secondary | ICD-10-CM

## 2019-08-14 DIAGNOSIS — I471 Supraventricular tachycardia: Secondary | ICD-10-CM

## 2019-08-14 DIAGNOSIS — I2510 Atherosclerotic heart disease of native coronary artery without angina pectoris: Secondary | ICD-10-CM | POA: Diagnosis not present

## 2019-08-14 NOTE — Patient Instructions (Signed)
Medication Instructions:  Your physician recommends that you continue on your current medications as directed. Please refer to the Current Medication list given to you today.  *If you need a refill on your cardiac medications before your next appointment, please call your pharmacy*  Lab Work: None ordered  If you have labs (blood work) drawn today and your tests are completely normal, you will receive your results only by: Marland Kitchen MyChart Message (if you have MyChart) OR . A paper copy in the mail If you have any lab test that is abnormal or we need to change your treatment, we will call you to review the results.  Testing/Procedures: None ordered  You have been referred to Dr. Lovena Le, Electrophysiologist.  Someone will call you for an appointment  Follow-Up: At [facility], you and your health needs are our priority.  As part of our continuing mission to provide you with exceptional heart care, we have created designated Provider Care Teams.  These Care Teams include your primary Cardiologist (physician) and Advanced Practice Providers (APPs -  Physician Assistants and Nurse Practitioners) who all work together to provide you with the care you need, when you need it.  Your next appointment:   Keep your scheduled appointment with Dr. [name] in January   The format for your next appointment:   In Person

## 2019-08-19 ENCOUNTER — Telehealth: Payer: Self-pay | Admitting: Internal Medicine

## 2019-08-19 NOTE — Telephone Encounter (Signed)
  New message   Spouse requesting to accompany patient during 12/24 visit; hearing loss

## 2019-08-20 ENCOUNTER — Telehealth: Payer: Self-pay | Admitting: Physician Assistant

## 2019-08-20 NOTE — Telephone Encounter (Signed)
Per review of last office note Pt is still wearing 30 day monitor.  Dr. Lovena Le reviewed strips available.  Per Dr. Lovena Le reschedule Pt for when heart monitor complete and results available.  Sending to scheduler to reschedule appt.

## 2019-08-20 NOTE — Telephone Encounter (Signed)
Patient will wear monitor until 08/22/19 and then return monitor to [employer].  Patient will return replacement phone in second box provided by [employer].  Both boxes should have a prepaid [employer] shipping label on them.

## 2019-08-20 NOTE — Telephone Encounter (Signed)
New message   Pt wife would like a call back regarding monitor. She said pt did not wear the monitor for the full 30 days because they had to have a new phone sent for the monitor. She asked if pt needs to wear it longer or take it off tomorrow? Please call.

## 2019-08-22 ENCOUNTER — Institutional Professional Consult (permissible substitution): Payer: Medicare Other | Admitting: Internal Medicine

## 2019-08-26 ENCOUNTER — Telehealth: Payer: Self-pay | Admitting: Physician Assistant

## 2019-08-26 NOTE — Telephone Encounter (Signed)
   Please return call to patient with monitor results

## 2019-08-26 NOTE — Telephone Encounter (Signed)
Pt and his wife have been notified of monitor results by phone with verbal understanding. Pt and his wife have been advised at this point to continue with all appt's as planned including the appt with Dr. Lovena Le for an EP Consult as this appt was already set up per Estella Husk, PAC. Pt and his wife were very grateful for the call and the help today. The patient has been notified of the result and verbalized understanding. All questions (if any) were answered. Julaine Hua, [facility] 08/26/2019 3:32 PM

## 2019-09-09 DIAGNOSIS — I495 Sick sinus syndrome: Secondary | ICD-10-CM | POA: Insufficient documentation

## 2019-09-09 NOTE — Progress Notes (Signed)
Cardiology Office Note    Date:  09/10/2019   [name], DOB 1941/07/02, MRN [id number]  PCP:  Leanna Battles, MD  Cardiologist: Ena Dawley, MD EPS: None  Chief Complaint  Patient presents with  . Follow-up    History of Present Illness:  [name] is a 79 y.o. male with history of chest pain and moderate nonobstructive CAD on coronary CT in 2015.  Statins were stopped because of memory problems.   I saw the patient in the office 10/31/2018 with atypical chest pain that was reproducible to touch on exam and worse when lifting weights with a trainer.  Chest pain was constant for several months and did not worsen when exercising on the treadmill or elliptical.  D dimer was Normal as was CBC and C met.  11/02/18 showed normal LVEF 65% with some diastolic dysfunction.  Patient was referred back by PCP because of recurrent chest pain and heart rate of 47.  Chest pain was felt to be musculoskeletal.  While wearing 30-day monitor we were called by the company he had PSVT 170 bpm and sinus bradycardia in the 40s.  I referred him to EP but after final monitor was read by Dr. [name] she felt he could hold off on EP referral.  Minimal heart rate was 38 that appeared to be sleeping hours and only the one episode of SVT.  Patient comes in accompanied by his wife. Never feels fast heart rates. Only dizzy if bends over and stands up quickly. Still has mild chest pain that's sore to touch but that's improved. Hasn't worked with a Clinical research associate in over a month. Finished PT for knew. Drinks 2 cups of coffee in am, sweet tea with meals and 1/2 soda daily.     Past Medical History:  Diagnosis Date  . Acid reflux    a. well-controlled with ranitidine.  Marland Kitchen BPH (benign prostatic hyperplasia)    a. s/p TURP 2012.  . Essential hypertension 09/10/2013  . Eye problem    a. L eye blurred vision - seeing eye doctor for tissue pulling away from eye.  . Hard of hearing   . Hiatal hernia    a.  Small-mod by CXR 2015.  Marland Kitchen High cholesterol     Past Surgical History:  Procedure Laterality Date  . PARS PLANA VITRECTOMY Left 2015   WITH MEMBRANE PEEL, 23 GAUGE  . TRANSURETHRAL RESECTION OF PROSTATE    . Urethral stricture surgery      Current Medications: Current Meds  Medication Sig  . acetaminophen (TYLENOL) 500 MG tablet Take 1,000 mg by mouth every 6 (six) hours as needed for mild pain or moderate pain.  Marland Kitchen aspirin 81 MG tablet Take 81 mg by mouth daily.  . Calcium Carbonate-Vitamin D (CALTRATE 600+D PO) Take 1 tablet by mouth daily.  Marland Kitchen COENZYME Q-10 PO Take 1 tablet by mouth daily.  Marland Kitchen desmopressin (DDAVP) 0.2 MG tablet   . FERREX 150 150 MG capsule Take 150 mg by mouth daily.  Marland Kitchen gabapentin (NEURONTIN) 100 MG capsule TAKE 1 CAPSULE BY MOUTH EVERYDAY AT BEDTIME  . meclizine (ANTIVERT) 25 MG tablet Take 25 mg by mouth daily.  . memantine (NAMENDA) 5 MG tablet Take 1 tablet by mouth 2 (two) times daily.  . Multiple Vitamins-Minerals (CENTRUM SILVER PO) Take 1 tablet by mouth daily.  . Omega-3 Fatty Acids (FISH OIL) 1000 MG CAPS Take 1 capsule (1,000 mg total) by mouth daily.  Vladimir Faster Glycol-Propyl Glycol (  SYSTANE OP) Apply 2 drops to eye daily as needed (Dry eyes). 2 drops each eye  . Red Yeast Rice 600 MG CAPS Take 1 capsule (600 mg total) by mouth daily.  Marland Kitchen rOPINIRole (REQUIP) 1 MG tablet Take 1 tablet by mouth at bedtime.     Allergies:   Sulfa antibiotics, Sulfamethoxazole, and Sulfasalazine   Social History   Socioeconomic History  . Marital status: Married    Spouse name: June  . Number of children: Not on file  . Years of education: Not on file  . Highest education level: Not on file  Occupational History  . Not on file  Tobacco Use  . Smoking status: Never Smoker  . Smokeless tobacco: Never Used  Substance and Sexual Activity  . Alcohol use: Yes    Comment: Once per month  . Drug use: No  . Sexual activity: Not on file  Other Topics Concern  . Not  on file  Social History Narrative  . Not on file   Social Determinants of Health   Financial Resource Strain:   . Difficulty of Paying Living Expenses: Not on file  Food Insecurity:   . Worried About Charity fundraiser in the Last Year: Not on file  . Ran Out of Food in the Last Year: Not on file  Transportation Needs:   . Lack of Transportation (Medical): Not on file  . Lack of Transportation (Non-Medical): Not on file  Physical Activity:   . Days of Exercise per Week: Not on file  . Minutes of Exercise per Session: Not on file  Stress:   . Feeling of Stress : Not on file  Social Connections:   . Frequency of Communication with Friends and Family: Not on file  . Frequency of Social Gatherings with Friends and Family: Not on file  . Attends Religious Services: Not on file  . Active Member of Clubs or Organizations: Not on file  . Attends Archivist Meetings: Not on file  . Marital Status: Not on file     Family History:  The patient's  family history includes Dementia in his brother; Other in his mother; Stroke in his brother and father; Tuberculosis in his paternal grandfather.   ROS:   Please see the history of present illness.    ROS All other systems reviewed and are negative.   PHYSICAL EXAM:   VS:  BP (!) 124/54   Pulse (!) 59   Ht '5\' 10"'$  (1.778 m)   Wt 192 lb (87.1 kg)   SpO2 98%   BMI 27.55 kg/m   Physical Exam  GEN: Well nourished, well developed, in no acute distress  Neck: no JVD, carotid bruits, or masses Cardiac:RRR; no murmurs, rubs, or gallops, mildly tender to touch in the left sternal area Respiratory:  clear to auscultation bilaterally, normal work of breathing GI: soft, nontender, nondistended, + BS Ext: without cyanosis, clubbing, or edema, Good distal pulses bilaterally Neuro:  Alert and Oriented x 3 Psych: euthymic mood, full affect  Wt Readings from Last 3 Encounters:  09/10/19 192 lb (87.1 kg)  08/14/19 183 lb (83 kg)    12/18/18 183 lb (83 kg)      Studies/Labs Reviewed:   EKG:  EKG is not ordered today.  Recent Labs: 10/31/2018: BUN 16; Creatinine, Ser 1.01; Hemoglobin 13.6; Platelets 238; Potassium 4.2; Sodium 138   Lipid Panel    Component Value Date/Time   CHOL 113 10/03/2013 0749   TRIG 72.0 10/03/2013  0749   HDL 50.50 10/03/2013 0749   CHOLHDL 2 10/03/2013 0749   VLDL 14.4 10/03/2013 0749   LDLCALC 48 10/03/2013 0749    Additional studies/ records that were reviewed today include:   Holter monitor 12/15/2020Sinus bradycardia to sinus rhythm.  Minimal heart rate 38 bpm, average heart rate 49 bpm.  No AV blocks, no pauses lasting more than 3 seconds, no arrhythmias.   Average heart rate 49 bpm, no AV blocks or pauses lasting more than 3 seconds.   2D echo 11/02/2018 IMPRESSIONS      1. The left ventricle has hyperdynamic systolic function, with an ejection fraction of >65%. The cavity size was normal. There is mildly increased left ventricular wall thickness. Left ventricular diastolic Doppler parameters are consistent with  impaired relaxation.  2. The right ventricle has normal systolic function. The cavity was normal. There is no increase in right ventricular wall thickness.  3. The mitral valve is normal in structure.  4. The tricuspid valve is normal in structure.  5. The aortic valve is tricuspid.   FINDINGS Coronary CTA 2015 FINDINGS: Coronary Arteries:  Originating in the normal position.   Left main has only minimal atherosclerotic plague. LM gives rise to LAD, small ramus intermedius and non-dominant LCX.   Ostial LAD has a mild mixed plague with 0-25% stenosis. LAD gives rise to a large diagonal branch (almost like dual LAD).   Proximal and mid LAD has a long moderate circumferential calcified plague with associated 25-50% stenosis. Distal LAD has only mild luminal irregularities.   First diagonal branch has calcified plague with associated 0-25% stenosis.    LCX is medium size non-dominant vessel. There is mild calcified plague in its mid segment associated with 0-25% stenosis.   RCA is a large dominant vessel. There is mild noncalcified plague in the mid RCA associated with 25-50% stenosis. Distal RCA has minimal calcified plague associated with <25% stenosis.   RCA gives rise to a large acute marginal branch that supplies PDA territory and a large PLVB. They both have only minimal calcified plague with associated 0-25% stenosis.   IMPRESSION: 1. Moderate diffuse non-obstructive CAD. Aggressive medical management is recommended.   Ena Dawley     Electronically Signed   By: Jenkins Rouge M.D.   On: 09/05/2013 17:49       ASSESSMENT:    1. Coronary artery disease involving native coronary artery of native heart without angina pectoris   2. Tachycardia-bradycardia syndrome ([place])   3. Other hyperlipidemia   4. Obstructive sleep apnea treated with continuous positive airway pressure (CPAP)      PLAN:  In order of problems listed above:  Bradycardia/SVT with heart rate in the 40s lowest heart rate 38 during sleeping hours.  An episode of SVT 8 seconds long 170 bpm 07/29/2019-asymptomatic. Dr. [name] reviewed and said he can cancel EPS appt. Patient isn't having dizziness, presyncope, syncope.  Will cancel appointment with Dr. Lovena Le but keep appointment with Dr. [name].  Decrease caffeine intake.  Nonobstructive CAD on CT 2015.  Recent chest pain felt to be musculoskeletal-continues to have a little soreness in his left chest that is tender to touch.  Follow-up with PCP.  Hyperlipidemia on fish oil and red yeast rice.  Statin stopped because of memory loss.  LDL 113 03/2018.  For fasting lipid panel when he sees Dr. [name].  OSA compliant with CPAP  Medication Adjustments/Labs and Tests Ordered: Current medicines are reviewed at length with the patient today.  Concerns  regarding medicines are outlined above.  Medication  changes, Labs and Tests ordered today are listed in the Patient Instructions below. Patient Instructions  Medication Instructions:  Your physician recommends that you continue on your current medications as directed. Please refer to the Current Medication list given to you today.  If you need a refill on your cardiac medications before your next appointment, please call your pharmacy.   Lab work: None Ordered  If you have labs (blood work) drawn today and your tests are completely normal, you will receive your results only by: Marland Kitchen MyChart Message (if you have MyChart) OR . A paper copy in the mail If you have any lab test that is abnormal or we need to change your treatment, we will call you to review the results.  Testing/Procedures: None ordered  Follow-Up: We have cancelled the appointment with Dr. Lovena Le  Keep appointment with Dr. [name] on 09/25/19 at 9:40 AM  Any Other Special Instructions Will Be Listed Below (If Applicable).  Decrease caffeine intake  Your provider has recommended that We are recommending the COVID-19 vaccine to all of our patients. Cardiac medications (including blood thinners) should not deter anyone from being vaccinated and there is no need to hold any of those medications prior to vaccine administration.  Currently, there is a hotline to call (active 09/06/19) to schedule vaccination appointments as no walk-ins will be accepted.   Vaccines through the health department can be arranged by calling [phone]   Vaccines through [facility] can be arranged by calling [phone] or visiting [email or web address]  If you have further questions or concerns about the vaccine process, please visit [email or web address], [email or web address], or contact your primary care physician.        Sumner Boast, PA-C  09/10/2019 11:23 AM    [facility] [address], [place], [place]  [address] Phone: [phone]; Fax: [phone]

## 2019-09-10 ENCOUNTER — Encounter: Payer: Self-pay | Admitting: Physician Assistant

## 2019-09-10 ENCOUNTER — Ambulatory Visit (INDEPENDENT_AMBULATORY_CARE_PROVIDER_SITE_OTHER): Payer: Medicare Other | Admitting: Physician Assistant

## 2019-09-10 ENCOUNTER — Other Ambulatory Visit: Payer: Self-pay

## 2019-09-10 VITALS — BP 124/54 | HR 59 | Ht 70.0 in | Wt 192.0 lb

## 2019-09-10 DIAGNOSIS — I495 Sick sinus syndrome: Secondary | ICD-10-CM | POA: Diagnosis not present

## 2019-09-10 DIAGNOSIS — E7849 Other hyperlipidemia: Secondary | ICD-10-CM | POA: Diagnosis not present

## 2019-09-10 DIAGNOSIS — G4733 Obstructive sleep apnea (adult) (pediatric): Secondary | ICD-10-CM | POA: Diagnosis not present

## 2019-09-10 DIAGNOSIS — I2510 Atherosclerotic heart disease of native coronary artery without angina pectoris: Secondary | ICD-10-CM

## 2019-09-10 DIAGNOSIS — Z9989 Dependence on other enabling machines and devices: Secondary | ICD-10-CM

## 2019-09-10 NOTE — Patient Instructions (Addendum)
Medication Instructions:  Your physician recommends that you continue on your current medications as directed. Please refer to the Current Medication list given to you today.  If you need a refill on your cardiac medications before your next appointment, please call your pharmacy.   Lab work: None Ordered  If you have labs (blood work) drawn today and your tests are completely normal, you will receive your results only by: Marland Kitchen MyChart Message (if you have MyChart) OR . A paper copy in the mail If you have any lab test that is abnormal or we need to change your treatment, we will call you to review the results.  Testing/Procedures: None ordered  Follow-Up: We have cancelled the appointment with Dr. Lovena Le  Keep appointment with Dr. [name] on 09/25/19 at 9:40 AM  Any Other Special Instructions Will Be Listed Below (If Applicable).  Decrease caffeine intake  Your provider has recommended that We are recommending the COVID-19 vaccine to all of our patients. Cardiac medications (including blood thinners) should not deter anyone from being vaccinated and there is no need to hold any of those medications prior to vaccine administration.  Currently, there is a hotline to call (active 09/06/19) to schedule vaccination appointments as no walk-ins will be accepted.   Vaccines through the health department can be arranged by calling [phone]   Vaccines through [facility] can be arranged by calling [phone] or visiting [email or web address]  If you have further questions or concerns about the vaccine process, please visit [email or web address], [email or web address], or contact your primary care physician.

## 2019-09-17 ENCOUNTER — Institutional Professional Consult (permissible substitution): Payer: Medicare Other | Admitting: Internal Medicine

## 2019-09-18 ENCOUNTER — Ambulatory Visit (INDEPENDENT_AMBULATORY_CARE_PROVIDER_SITE_OTHER): Payer: Medicare Other | Admitting: Neurology

## 2019-09-18 ENCOUNTER — Encounter: Payer: Self-pay | Admitting: Neurology

## 2019-09-18 ENCOUNTER — Other Ambulatory Visit: Payer: Self-pay

## 2019-09-18 VITALS — BP 130/67 | HR 55 | Temp 97.2°F | Ht 70.0 in | Wt 190.0 lb

## 2019-09-18 DIAGNOSIS — G301 Alzheimer's disease with late onset: Secondary | ICD-10-CM

## 2019-09-18 DIAGNOSIS — F0280 Dementia in other diseases classified elsewhere without behavioral disturbance: Secondary | ICD-10-CM | POA: Insufficient documentation

## 2019-09-18 DIAGNOSIS — G4733 Obstructive sleep apnea (adult) (pediatric): Secondary | ICD-10-CM

## 2019-09-18 DIAGNOSIS — I495 Sick sinus syndrome: Secondary | ICD-10-CM

## 2019-09-18 DIAGNOSIS — I2510 Atherosclerotic heart disease of native coronary artery without angina pectoris: Secondary | ICD-10-CM | POA: Diagnosis not present

## 2019-09-18 DIAGNOSIS — G4731 Primary central sleep apnea: Secondary | ICD-10-CM | POA: Diagnosis not present

## 2019-09-18 MED ORDER — DONEPEZIL HCL 5 MG PO TABS: 5.0000 mg | ORAL_TABLET | Freq: Every day | ORAL | 5 refills | Status: DC

## 2019-09-18 NOTE — Progress Notes (Signed)
SLEEP MEDICINE CLINIC   Provider:  Larey Seat, M D  Primary Care Physician:  Leanna Battles, MD   Referring Provider: Leanna Battles, MD   Chief Complaint  Patient presents with  . Follow-up    pt with wife, rm 65. pt can tell memory has declined, "my wife keeps me straight". he is using his machine [employer]    HPI:  [name] is a 79 y.o. male patient , seen here in a Revisit:  I have the pleasure of meeting [name] and his wife today, he is meanwhile 42 years old he has a history of obstructive sleep apnea treated with CPAP, but he also was diagnosed 2 years ago with cognitive impairment with memory loss, this kind of amnestic cognitive impairment has a 7% conversion rate to dementia per year.  Goal is to test him and serial intervals to make sure That we can start medication should this arise, and he had tried Vyagog- no success. MMSE has declined. Now defiantly dementia.   Original referral  from Dr. Philip Aspen for evaluation of sleep medicine needs.  The patient has been evaluated for OSA last in 2008 and was using a oxygen concentrator, originally seen by me in the same year he was issued a CPAP machine. He could not get comfortable with CPAP, had air leaks, and aerophagia. Oxygen was finally prescribed alone - a compromise to maintain SpO2 . During the last visit with his primary care physician, Dr. [name], the patient had also been tested for short-term memory concerns.  Reported that some people he had known for many years he could no longer remember by the names.  He is also easily distracted, sometimes forgets what day of the week it is.  He also discussed via cog.  In an annual physical examination in August 2018 pravastatin was discontinued and he started DDAVP  to reduce nocturia and improve sleep quality.  October 2018 his urologist confirmed that she seemed to be doing well.  He has also used gabapentin 100 mg at bedtime which has helped with  sleep.  Cardiologist last visited in December 2018 felt he was stable with no changes to EKG.  Reviewed his medications today, 11-23-2017  I have the pleasure of seeing [name] today for a revisit on 23 November 2017.  He is here in the presence of his wife, the patient underwent a polysomnography study on 13 November 2017.  He was diagnosed with a rather mild apnea at an AHI of 14.7 which exacerbated in supine sleep position to an apnea hypopnea index of 16.1/h of sleep.  During REM sleep the AHI was also higher at 15.9/h.  Only 14 minutes of total desaturation time were seen indicating that the patient does not need oxygen.  There were frequent periodic limb movements and arousals related to limb movements of 2.7 /h.  He assured me that he sleeps at night on his side, confirmed by his wife. He wants to keep his oxygen concentrator - likes it and feels it helps. He is due for a new certification in July stated his wife. He is very disappointed that he wouldn't qualify for a new concentrator.   Chief complaint according to patient :  "I have no sleep issues"   Sleep habits are as follows:  He drinks a glass of tea at dinner, sometimes cola. The patient reports that sometimes he will dose off before his wife. He goes to bed by 9.30  and reads in bed.  He is using an oxygen concentrator at 2 L with a nasal cannula.  His nocturia has been reduced to once at night after he started DDAVP.  His wife does not report that he snores, she has not witnessed any apneas.  He seems to sleep well and through the night until 6.30 when he rises, he wakes spontaneously. The couple exercises twice a week in AM with a trainer.  Drinks coffee in AM, feels refreshed most of the time, no dry mouth, dizziness, nausea, palpitations or headaches.  During the  daytime he  falls asleep easily and not physically active or mentally stimulated. He may stay asleep for about 30 minutes.   Sleep medical history and family sleep  history:  An adult son suffered a stroke and is currently living with his family again, there are no other sleep disorders or history of sleep disorders known among the blood relations of Mr. Hanley.- history of tonsillectomy, neck surgery, traumatic brain injury in the patient. No sleep walking, enuresis or night terrors.  ,mother lived to be 82, father 52 or 92, no dementia history.   Social history: adult son suffered a stroke and now lives with parents, again. Another adult son lives in [place], 3 grandchildren. Non smoker. ETOH 2 times / month.   Review of Systems: Out of a complete 14 system review, the patient complains of only the following symptoms, and all other reviewed systems are negative.  Patient is here mainly for a reevaluation if he has sleep apnea or not.  Dr. Philip Aspen also is concerned that possible low oxygen levels at night or an undetected sleep disorder may contribute to the patient's recent cognitive challenges.  He reports memory loss, some confusion, some anxiety, cough and hearing loss.  He is status post TURP transurethral resection of the prostate.    Social History   Socioeconomic History  . Marital status: Married    Spouse name: [name]  . Number of children: Not on file  . Years of education: Not on file  . Highest education level: Not on file  Occupational History  . Not on file  Tobacco Use  . Smoking status: Never Smoker  . Smokeless tobacco: Never Used  Substance and Sexual Activity  . Alcohol use: Yes    Comment: Once per month  . Drug use: No  . Sexual activity: Not on file  Other Topics Concern  . Not on file  Social History Narrative  . Not on file   Social Determinants of Health   Financial Resource Strain:   . Difficulty of Paying Living Expenses: Not on file  Food Insecurity:   . Worried About Charity fundraiser in the Last Year: Not on file  . Ran Out of Food in the Last Year: Not on file  Transportation Needs:   . Lack of  Transportation (Medical): Not on file  . Lack of Transportation (Non-Medical): Not on file  Physical Activity:   . Days of Exercise per Week: Not on file  . Minutes of Exercise per Session: Not on file  Stress:   . Feeling of Stress : Not on file  Social Connections:   . Frequency of Communication with Friends and Family: Not on file  . Frequency of Social Gatherings with Friends and Family: Not on file  . Attends Religious Services: Not on file  . Active Member of Clubs or Organizations: Not on file  . Attends Archivist Meetings: Not  on file  . Marital Status: Not on file  Intimate Partner Violence:   . Fear of Current or Ex-Partner: Not on file  . Emotionally Abused: Not on file  . Physically Abused: Not on file  . Sexually Abused: Not on file    Family History  Problem Relation Age of Onset  . Other Mother        Died at 59 - "old age"  . Stroke Father        Died at 31  . Tuberculosis Paternal Grandfather   . Dementia Brother   . Stroke Brother   . CAD Neg Hx     Past Medical History:  Diagnosis Date  . Acid reflux    a. well-controlled with ranitidine.  Marland Kitchen BPH (benign prostatic hyperplasia)    a. s/p TURP 2012.  . Essential hypertension 09/10/2013  . Eye problem    a. L eye blurred vision - seeing eye doctor for tissue pulling away from eye.  . Hard of hearing   . Hiatal hernia    a. Small-mod by CXR 2015.  Marland Kitchen High cholesterol     Past Surgical History:  Procedure Laterality Date  . PARS PLANA VITRECTOMY Left 2015   WITH MEMBRANE PEEL, 23 GAUGE  . TRANSURETHRAL RESECTION OF PROSTATE    . Urethral stricture surgery      Current Outpatient Medications  Medication Sig Dispense Refill  . acetaminophen (TYLENOL) 500 MG tablet Take 1,000 mg by mouth every 6 (six) hours as needed for mild pain or moderate pain.    Marland Kitchen aspirin 81 MG tablet Take 81 mg by mouth daily.    . Calcium Carbonate-Vitamin D (CALTRATE 600+D PO) Take 1 tablet by mouth daily.    Marland Kitchen  COENZYME Q-10 PO Take 1 tablet by mouth daily.    Marland Kitchen desmopressin (DDAVP) 0.2 MG tablet   0  . FERREX 150 150 MG capsule Take 150 mg by mouth daily.    Marland Kitchen gabapentin (NEURONTIN) 100 MG capsule TAKE 1 CAPSULE BY MOUTH EVERYDAY AT BEDTIME  1  . meclizine (ANTIVERT) 25 MG tablet Take 25 mg by mouth daily.    . memantine (NAMENDA) 5 MG tablet Take 1 tablet by mouth 2 (two) times daily.    . Multiple Vitamins-Minerals (CENTRUM SILVER PO) Take 1 tablet by mouth daily.    . Omega-3 Fatty Acids (FISH OIL) 1000 MG CAPS Take 1 capsule (1,000 mg total) by mouth daily.  0  . Polyethyl Glycol-Propyl Glycol (SYSTANE OP) Apply 2 drops to eye daily as needed (Dry eyes). 2 drops each eye    . Red Yeast Rice 600 MG CAPS Take 1 capsule (600 mg total) by mouth daily. 90 capsule 2  . rOPINIRole (REQUIP) 1 MG tablet Take 1 tablet by mouth at bedtime.     No current facility-administered medications for this visit.    Allergies as of 09/18/2019 - Review Complete 09/10/2019  Allergen Reaction Noted  . Sulfa antibiotics Nausea Only and Rash 09/05/2013  . Sulfamethoxazole Nausea Only and Rash 12/09/2011  . Sulfasalazine Nausea Only and Rash 01/24/2014    Vitals: BP 130/67   Pulse (!) 55   Temp (!) 97.2 F (36.2 C)   Ht 5\' 10"  (1.778 m)   Wt 190 lb (86.2 kg)   BMI 27.26 kg/m  Last Weight:  Wt Readings from Last 1 Encounters:  09/18/19 190 lb (86.2 kg)   [id number] mass index is 27.26 kg/m.     Last Height:  Ht Readings from Last 1 Encounters:  09/18/19 5\' 10"  (1.778 m)    Physical exam:  General: The patient is awake, alert and appears not in acute distress. The patient is well groomed. Head: Normocephalic, atraumatic. Neck is supple. Mallampati 2 with a tongue and uvula moving in midline, the uvula lifts above the tongue ground,  neck circumference: 15.5  Nasal airflow patent, Retrognathia is not seen.  Cardiovascular:  Regular rate and rhythm , without  murmurs or carotid bruit, and without  distended neck veins. Respiratory: Lungs are clear to auscultation. Skin:  Without evidence of edema, or rash Trunk: BMI is 27.2.    Neurologic exam : The patient is awake and alert, but reports  being challenged " orientation  to place and time" .   MOCA:No flowsheet data found.   MMSE: MMSE - Mini Mental State Exam 09/18/2019 11/23/2017 10/11/2017  Orientation to time 1 3 1   Orientation to Place 3 5 4   Registration 3 3 3   Attention/ Calculation 1 5 5   Attention/Calculation-comments - couldnt do numbers couldnt do the numbers  Recall 0 1 0  Language- name 2 objects 2 2 2   Language- repeat 1 1 1   Language- follow 3 step command 3 3 3   Language- read & follow direction 1 1 1   Write a sentence 1 1 1   Copy design 1 0 0  Total score 17 25 21      Attention span & concentration ability appears limited. The patient looks towards his wife for answers.  Speech is fluent, without dysarthria, dysphonia or aphasia.  Mood and affect are a little agitated   Cranial nerves: Pupils are equal and briskly reactive to light. Funduscopic exam without evidence of pallor or edema. Extraocular movements  in vertical and horizontal planes intact and without nystagmus. Visual fields by finger perimetry are intact. Hearing to finger rub intact- with hearing aids in place.  Facial sensation intact to fine touch. Facial motor strength is symmetric and tongue and uvula move midline. Shoulder shrug was symmetrical.   Motor exam:   Normal tone, muscle bulk and symmetric strength in all extremities. Sensory:  Fine touch, pinprick and vibration were normal. Coordination: Rapid alternating movements in the fingers/hands was normal.  Finger-to-nose maneuver  normal without evidence of ataxia, dysmetria or tremor. Gait and station: Patient walks without assistive device .Tandem gait is unfragmented. Turns with 3  Steps. Romberg testing is negative. Deep tendon reflexes: in the  upper and lower extremities are  symmetric 1 plus.  Babinski maneuver deferred.   Assessment:  After physical and neurologic examination, review of laboratory studies,  Personal review of imaging studies, reports of other /same  Imaging studies, results of polysomnography and / or neurophysiology testing and pre-existing records as far as provided in visit., my assessment is   MCI or early dementia - followed by PCP.  Had started Vyacog, without success.. Dr Leanna Battles, MD.     1) EDS:  Mr. Mizera reports that he feels more sleepy now in daytime, easily dozes off when physically not active and not stimulated in other ways.  However he does not report sleep attacks, he does not feel that he is likely to fall asleep in a conversation or in a situation where it is crucial to be alert.  I do not have records available from 2008 or earlier unfortunately these have fallen victim to our computerization.   It seems that he has not presented with snoring or any witnessed apneas, and  is doing well and on oxygen concentrator at night.  Dr. Sharlett Iles asked me to review if he still has apnea and I will be happy to order a sleep study, but the patient just told me that he had an overnight pulse oximetry and that these results are still pending.this ONO was performed on oxygen.  What I will suggest is that we order a SPLIT .  His oxygen concentrator is over 82 years old- and I can only prescribe oxygen if titrated in an attended sleep study and if he failed CPAP.  If he is not willing to come in for a SPLIT night study - I can only refer him to Pulmonology.   The patient was advised of the nature of the diagnosed disorder , the treatment options and the  risks for general health and wellness arising from not treating the condition.   I spent more than 45 minutes of face to face time with the patient.  Greater than 50% of time was spent in counseling and coordination of care. We have discussed the diagnosis and differential and I answered  the patient's questions.    Plan:  Treatment plan and additional workup :  Mrs. Prohaska is for a new SPLIT night evaluation and I will order a SPLIT.   I would refer to Pulmonology for oxygen needs, if OK with Dr Philip Aspen.   Dr Arn Medal, etc.     Dorn Hartshorne, MD [id number], [id number] AM  Certified in Neurology by ABPN Certified in Sleep Medicine by [facility] [address], [place], [place] [address]           SLEEP MEDICINE CLINIC   Provider:  Larey Seat, [place] D  Primary Care Physician:  Leanna Battles, MD   Referring Provider: Leanna Battles, MD   Chief Complaint  Patient presents with  . Follow-up    pt with wife, rm 29. pt can tell memory has declined, "my wife keeps me straight". he is using his machine [employer]    HPI:  [name] is a 79 y.o. male , seen here  in a referral   from Dr. Philip Aspen for evaluation of sleep medicine needs.  The patient has been evaluated for OSA last in 2008 and was using a oxygen concentrator, originally seen by me in the same year he was issued a CPAP machine. He could not get comfortable with CPAP, had air leaks, and aerophagia. Oxygen was finally prescribed alone - a compromise to maintain SpO2 .  During the last visit with his primary care physician, Dr. Juliette Mangle, the patient had also been tested for short-term memory concerns.  Reported that some people he had known for many years he could no longer remember by the names.  He is also easily distracted, sometimes forgets what day of the week it is.  He also discussed via cog.  In an annual physical examination in August 2018 pravastatin was discontinued and he started DDAVP  to reduce nocturia and improve sleep quality.  October 2018 his urologist confirmed that she seemed to be doing well.  He has also used gabapentin 100 mg at bedtime which has helped with sleep.  Cardiologist last visited in December 2018 felt he was stable  with no changes to EKG.  Chief complaint according to patient :  "I have no sleep issues"   Sleep habits are as follows:  He drinks a glass of tea at dinner, sometimes cola. The  patient reports that sometimes he will dose off before his wife. He goes to bed by 9.30 and reads in bed.  He is using an oxygen concentrator at 2 L with a nasal cannula.  His nocturia has been reduced to once at night after he started DDAVP.  His wife does not report that he snores, she has not witnessed any apneas.  He seems to sleep well and through the night until 6.30 when he rises, he wakes spontaneously. The couple exercises twice a week in AM with a trainer.  Drinks coffee in AM, feels refreshed most of the time, no dry mouth, dizziness, nausea, palpitations or headaches.  During the  daytime he  falls asleep easily and not physically active or mentally stimulated. He may stay asleep for about 30 minutes.   Sleep medical history and family sleep history: An adult son suffered a stroke and is currently living with his family again, there are no other sleep disorders or history of sleep disorders known among the blood relations of [name].- history of tonsillectomy, neck surgery, traumatic brain injury in the patient. No sleep walking, enuresis or night terrors.    Social history: adult son suffered a stroke and now lives with parents, again. Another adult son lives in [place], 3 grandchildren. Non smoker. ETOH 2 times / month.   Review of Systems: Out of a complete 14 system review, the patient complains of only the following symptoms, and all other reviewed systems are negative.  Patient is here mainly for a reevaluation if he has sleep apnea or not.  Dr. Sharlett Iles also is concerned that possible low oxygen levels at night or an undetected sleep disorder may contribute to the patient's recent cognitive challenges.   He reports memory loss, some confusion, some anxiety, cough and hearing loss.   He is status post TURP  transurethral resection of the prostate.  Reviewed his medications.  Epworth score 16 from 11 , Fatigue severity score 13  , depression score 4/15    Social History   Socioeconomic History  . Marital status: Married    Spouse name: [name]  . Number of children: Not on file  . Years of education: Not on file  . Highest education level: Not on file  Occupational History  . Not on file  Tobacco Use  . Smoking status: Never Smoker  . Smokeless tobacco: Never Used  Substance and Sexual Activity  . Alcohol use: Yes    Comment: Once per month  . Drug use: No  . Sexual activity: Not on file  Other Topics Concern  . Not on file  Social History Narrative  . Not on file   Social Determinants of Health   Financial Resource Strain:   . Difficulty of Paying Living Expenses: Not on file  Food Insecurity:   . Worried About Charity fundraiser in the Last Year: Not on file  . Ran Out of Food in the Last Year: Not on file  Transportation Needs:   . Lack of Transportation (Medical): Not on file  . Lack of Transportation (Non-Medical): Not on file  Physical Activity:   . Days of Exercise per Week: Not on file  . Minutes of Exercise per Session: Not on file  Stress:   . Feeling of Stress : Not on file  Social Connections:   . Frequency of Communication with Friends and Family: Not on file  . Frequency of Social Gatherings with Friends and Family: Not on file  .  Attends Religious Services: Not on file  . Active Member of Clubs or Organizations: Not on file  . Attends Archivist Meetings: Not on file  . Marital Status: Not on file  Intimate Partner Violence:   . Fear of Current or Ex-Partner: Not on file  . Emotionally Abused: Not on file  . Physically Abused: Not on file  . Sexually Abused: Not on file    Family History  Problem Relation Age of Onset  . Other Mother        Died at 65 - "old age"  . Stroke Father        Died at 51  . Tuberculosis Paternal Grandfather    . Dementia Brother   . Stroke Brother   . CAD Neg Hx     Past Medical History:  Diagnosis Date  . Acid reflux    a. well-controlled with ranitidine.  Marland Kitchen BPH (benign prostatic hyperplasia)    a. s/p TURP 2012.  . Essential hypertension 09/10/2013  . Eye problem    a. L eye blurred vision - seeing eye doctor for tissue pulling away from eye.  . Hard of hearing   . Hiatal hernia    a. Small-mod by CXR 2015.  Marland Kitchen High cholesterol     Past Surgical History:  Procedure Laterality Date  . PARS PLANA VITRECTOMY Left 2015   WITH MEMBRANE PEEL, 23 GAUGE  . TRANSURETHRAL RESECTION OF PROSTATE    . Urethral stricture surgery      Current Outpatient Medications  Medication Sig Dispense Refill  . acetaminophen (TYLENOL) 500 MG tablet Take 1,000 mg by mouth every 6 (six) hours as needed for mild pain or moderate pain.    Marland Kitchen aspirin 81 MG tablet Take 81 mg by mouth daily.    . Calcium Carbonate-Vitamin D (CALTRATE 600+D PO) Take 1 tablet by mouth daily.    Marland Kitchen COENZYME Q-10 PO Take 1 tablet by mouth daily.    Marland Kitchen desmopressin (DDAVP) 0.2 MG tablet   0  . FERREX 150 150 MG capsule Take 150 mg by mouth daily.    Marland Kitchen gabapentin (NEURONTIN) 100 MG capsule TAKE 1 CAPSULE BY MOUTH EVERYDAY AT BEDTIME  1  . meclizine (ANTIVERT) 25 MG tablet Take 25 mg by mouth daily.    . memantine (NAMENDA) 5 MG tablet Take 1 tablet by mouth 2 (two) times daily.    . Multiple Vitamins-Minerals (CENTRUM SILVER PO) Take 1 tablet by mouth daily.    . Omega-3 Fatty Acids (FISH OIL) 1000 MG CAPS Take 1 capsule (1,000 mg total) by mouth daily.  0  . Polyethyl Glycol-Propyl Glycol (SYSTANE OP) Apply 2 drops to eye daily as needed (Dry eyes). 2 drops each eye    . Red Yeast Rice 600 MG CAPS Take 1 capsule (600 mg total) by mouth daily. 90 capsule 2  . rOPINIRole (REQUIP) 1 MG tablet Take 1 tablet by mouth at bedtime.     No current facility-administered medications for this visit.    Allergies as of 09/18/2019 - Review  Complete 09/10/2019  Allergen Reaction Noted  . Sulfa antibiotics Nausea Only and Rash 09/05/2013  . Sulfamethoxazole Nausea Only and Rash 12/09/2011  . Sulfasalazine Nausea Only and Rash 01/24/2014    Vitals: BP 130/67   Pulse (!) 55   Temp (!) 97.2 F (36.2 C)   Ht 5\' 10"  (1.778 m)   Wt 190 lb (86.2 kg)   BMI 27.26 kg/m  Last Weight:  Wt  Readings from Last 1 Encounters:  09/18/19 190 lb (86.2 kg)   [id number] mass index is 27.26 kg/m.     Last Height:   Ht Readings from Last 1 Encounters:  09/18/19 5\' 10"  (1.778 m)    Physical exam:  General: The patient is awake, alert and appears not in acute distress. The patient is well groomed. Head: Normocephalic, atraumatic. Neck is supple. Mallampati 2 with a tongue and uvula moving in midline, the uvula lifts above the tongue ground,  neck circumference: 15.5  Nasal airflow patent, Retrognathia is not seen.  Cardiovascular:  Regular rate and rhythm , without  murmurs or carotid bruit, and without distended neck veins. Respiratory: Lungs are clear to auscultation. Skin:  Without evidence of edema, or rash Trunk: BMI is 27.69.    Neurologic exam : The patient is awake and alert, but reports  being challenged " orientation  to place and time" .   MOCA:No flowsheet data found.   MMSE: MMSE - Mini Mental State Exam 09/18/2019 11/23/2017 10/11/2017  Orientation to time 1 3 1   Orientation to Place 3 5 4   Registration 3 3 3   Attention/ Calculation 1 5 5   Attention/Calculation-comments - couldnt do numbers couldnt do the numbers  Recall 0 1 0  Language- name 2 objects 2 2 2   Language- repeat 1 1 1   Language- follow 3 step command 3 3 3   Language- read & follow direction 1 1 1   Write a sentence 1 1 1   Copy design 1 0 0  Total score 17 25 21      Attention span & concentration ability appears limited. The patient looks towards his wife for answers.  Speech is fluent, without dysarthria, dysphonia or aphasia.  Mood and affect are  appropriate.  Cranial nerves: Pupils are equal and briskly reactive to light. Funduscopic exam without evidence of pallor or edema. Extraocular movements  in vertical and horizontal planes intact and without nystagmus. Visual fields by finger perimetry are intact. Hearing to finger rub intact- with hearing aids in place.  Facial sensation intact to fine touch. Facial motor strength is symmetric and tongue and uvula move midline. Shoulder shrug was symmetrical.   Motor exam:   Normal tone, muscle bulk and symmetric strength in all extremities. Sensory:  Fine touch, pinprick and vibration were tested in all extremities. Proprioception tested in the upper extremities was normal. Coordination: Rapid alternating movements in the fingers/hands was normal. Finger-to-nose maneuver  normal without evidence of ataxia, dysmetria or tremor. Gait and station: Patient walks without assistive device .Tandem gait is unfragmented. Turns with 3  Steps. Romberg testing is negative. Deep tendon reflexes: in the  upper and lower extremities are symmetric and intact. Babinski maneuver response is downgoing.   Study Performed:   Baseline Polysomnogram HISTORY:  The patient is a 79 year old Male and had in 2008 been evaluated for OSA, he was using an oxygen concentrator at the time and was issued a CPAP machine in addition after PSG. He could not get comfortable with CPAP, had air leaks, and aerophobia. Oxygen was finally prescribed alone - a compromise to maintain SpO2. During the last visit with his primary care physician, Dr. Leanna Battles, the patient had also been tested for short-term memory concerns.  Reported that he could no longer remember some people he had known for many years by their names.  He is also easily distracted, sometimes forgets what day of the week it is.  In August 2018 pravastatin was discontinued  and he started DDAVP to reduce nocturia and improve sleep quality. He has also used gabapentin 100 mg  at bedtime which has helped with sleep.   In October 2018 his urologist confirmed that she seemed to be doing well.  His Cardiologist felt he was stable with no changes to EKG in 07/2017.  The patient endorsed the Epworth Sleepiness Scale at 11/24 points, FSS at 13, and MMSE at 21/30 points.   The patient's weight 193 pounds with a height of 60 (inches), resulting in a BMI of 38.1 kg/m2. The patient's neck circumference measured 15.5 inches.  CURRENT MEDICATIONS: Acetaminophen, Aspirin, Calcium Vitamin-D, Coenzyme, Desmopressin, Gabapentin, Meclizine, Namenda, Multi-Vitamin, Omega-3, Pravastatin, Ranitidine and Vayacog.   PROCEDURE:  This is a multichannel digital polysomnogram utilizing the SomnoStar 11.2 system.  Electrodes and sensors were applied and monitored per AASM Specifications.   EEG, EOG, Chin and Limb EMG, were sampled at 200 Hz.  ECG, Snore and Nasal Pressure, Thermal Airflow, Respiratory Effort, CPAP Flow and Pressure, Oximetry was sampled at 50 Hz. Digital video and audio were recorded.      BASELINE STUDY Lights Out was at 22:29 and Lights On at 05:01.  Total recording time (TRT) was 392.5 minutes, with a total sleep time (TST) of 331 minutes.   The patient's sleep latency was 24 minutes.  REM latency was 112 minutes.  The sleep efficiency was 84.3 %.                     SLEEP ARCHITECTURE: WASO (Wake after sleep onset) was 54.5 minutes.  There were 13 minutes in Stage N1, 254 minutes Stage N2, 0 minutes Stage N3 and 64 minutes in Stage REM.  The percentage of Stage N1 was 3.9%, Stage N2 was 76.7%, Stage N3 was 0% and Stage R (REM sleep) was 19.3%.   RESPIRATORY ANALYSIS:  There were a total of 81 respiratory events:  6 obstructive apneas, 0 central apneas and 75 hypopneas with 0 respiratory event related arousals (RERAs). The total APNEA/HYPOPNEA INDEX (AHI) was 14.7/hour and the total RESPIRATORY DISTURBANCE INDEX was 14.7 /hour.  17 events occurred in REM sleep and 116 events  in NREM. The REM AHI was 15.9 /hour, versus a non-REM AHI of 14.4. The patient spent 280 minutes of total sleep time in the supine position and 51 minutes in non-supine. The supine AHI was 16.1 versus a non-supine AHI of 7.1.  OXYGEN SATURATION & C02:  The Wake baseline 02 saturation was 94%, with the lowest being 73%. Time spent below 89% saturation equaled 14 minutes. The patient did not use oxygen.                                                          PERIODIC LIMB MOVEMENTS:  The patient had a total of 168 Periodic Limb Movements.  The Periodic Limb Movement (PLM) index was 30.5 and the PLM Arousal index was 2.7/hour. The arousals were noted as: 23 were spontaneous, 15 were associated with PLMs, and 82 were associated with respiratory events. Audio and video analysis did not show any abnormal or unusual movements, behaviors, phonations or vocalizations.   The patient took one bathroom breaks. Some Snoring was noted. EKG was regular. Post-study, the patient indicated that sleep was better than usual.    IMPRESSION:  1. Mild Obstructive Sleep Apnea/ hypopnea syndrome (OSA) at [facility] of 14/h, and only moderately accentuated in REM sleep and supine sleep.  2. Periodic Limb Movement Disorder (PLMD).  No hypoxemia noted- neither oxygen not CPAP were implemented during this PSG.  1. There were frequent periodic limb movements of sleep (PLMS) with associated sleep disruption.  Consider treating the PLMS primarily. Avoid caffeine-containing beverages and chocolate  2. Positional therapy is advised. Neither oxygen not CPAP use are indicated.  3. Correlate clinically for a history consistent with regarding restless legs syndrome (RLS).    Consider secondary restless legs syndrome.  Pharmacotherapy may be warranted.  Obtain a serum ferritin level if the clinical history is consistent with RLS.  Consider iron therapy and evaluation for iron deficiency anemia if the serum ferritin level < 50 ng/ml.    4. A follow up appointment will be scheduled in the Sleep Clinic at [facility]. The referring provider will be notified of the results.      I certify that I have reviewed the entire raw data recording prior to the issuance of this report in accordance with the Standards of Accreditation of the [employer] (AASM)     Larey Seat, MD      11-21-2017  Diplomat, American Board of Psychiatry and Neurology  Diplomat, American Board of Sleep Medicine Medical Director, [facility] [employer] Provider CC Chart Rep sent to Leanna Battles, MD  Sent 11/21/2017 Communication Routing History  Recipient Method Sent by Date Sent  Leanna Battles, MD Fax Batu Cassin, Asencion Partridge, MD 11/21/2017  Fax: [phone]  Phone: [phone]   No questionnaires available.           Orders Performed    Split night study Medication Changes    None   Medication List  Visit Diagnoses    Amnestic MCI (mild cognitive impairment with memory loss)   OSA (obstructive sleep apnea)   Problem List  Level of Service  All Charges for This Encounter  Code  [id number]  Description: PR POLYSOM 6/>YRS SLEEP 4/> ADDL PARAM ATTND  Service Date: 11/13/2017  Service Provider: Larey Seat, MD  Qty: 1     Assessment:  After physical and neurologic examination, review of laboratory studies,  Personal review of imaging studies, reports of other /same  Imaging studies, results of polysomnography and / or neurophysiology testing and pre-existing records as far as provided in visit., my assessment is     1) EDS:  Mr. Ehrler reports that he feels more sleepy now in daytime, easily dozes off when physically not active and not stimulated in other ways.  However he does not report sleep attacks, he has mild Apnea-  He agreed to rather try CPAP -a repeat sleep study in March 2019 had shown sleep apnea still to be present, and the patient  has been a very compliant CPAP user since he has used the machine 30 out of 30 days and 26 days over 4 hours consecutively.  Average use at time of 6 hours 59 minutes, minimum pressure is 5 and maximum pressure 10 cmH2O with 3 cm EPR he does have central apneas arising which is common in patients with neurodegenerative diseases.  Obstructive sleep apnea is not the primary problem here and for this reason I have not increase the maximum pressure setting.  We may be actually able to reduce the maximum pressure to 9 or even 8 cmH2O hoping  that this would reduce his central apneas, otherwise his residual AHI of 10.2/h is a little bit too high for my comfort and I would rather use-use BiPAP instead.  He endorsed the fatigue severity scale at 20 points in theFSS, Epworth score was 10/ 24.  Plan:  Treatment plan and additional workup :  No o2 needs after review of PSG, CPAP seems to trigger central apneas. I will reduce the CPAP max pressure to 8 cm water from 10 and Review a download in 2-3 month again, if not successful, will need to change to BiPAP.  2)dementia. Has stopped Vyacog after 4 years, since last year- [place] has been used -this is twice a day. He has bradycardia and wold not be a good candidate for ARICEPT.  I think we can try 5 mg at night po.   3) no REM BD. No hallucinations, no parkinsonism.   Larey Seat, MD [id number], [id number] AM  Certified in Neurology by ABPN Certified in [place] by [facility] [address] [place], [place] [address]

## 2019-09-18 NOTE — Patient Instructions (Signed)
Donepezil tablets What is this medicine? DONEPEZIL (doe NEP e zil) is used to treat mild to moderate dementia caused by Alzheimer's disease. This medicine may be used for other purposes; ask your health care provider or pharmacist if you have questions. COMMON BRAND NAME(S): Aricept What should I tell my health care provider before I take this medicine? They need to know if you have any of these conditions:  asthma or other lung disease  difficulty passing urine  head injury  heart disease  history of irregular heartbeat  liver disease  seizures (convulsions)  stomach or intestinal disease, ulcers or stomach bleeding  an unusual or allergic reaction to donepezil, other medicines, foods, dyes, or preservatives  pregnant or trying to get pregnant  breast-feeding How should I use this medicine? Take this medicine by mouth with a glass of water. Follow the directions on the prescription label. You may take this medicine with or without food. Take this medicine at regular intervals. This medicine is usually taken before bedtime. Do not take it more often than directed. Continue to take your medicine even if you feel better. Do not stop taking except on your doctor's advice. If you are taking the 23 mg donepezil tablet, swallow it whole; do not cut, crush, or chew it. Talk to your pediatrician regarding the use of this medicine in children. Special care may be needed. Overdosage: If you think you have taken too much of this medicine contact a poison control center or emergency room at once. NOTE: This medicine is only for you. Do not share this medicine with others. What if I miss a dose? If you miss a dose, take it as soon as you can. If it is almost time for your next dose, take only that dose, do not take double or extra doses. What may interact with this medicine? Do not take this medicine with any of the following medications:  certain medicines for fungal infections like  itraconazole, fluconazole, posaconazole, and voriconazole  cisapride  dextromethorphan; quinidine  dronedarone  pimozide  quinidine  thioridazine This medicine may also interact with the following medications:  antihistamines for allergy, cough and cold  atropine  bethanechol  carbamazepine  certain medicines for bladder problems like oxybutynin, tolterodine  certain medicines for Parkinson's disease like benztropine, trihexyphenidyl  certain medicines for stomach problems like dicyclomine, hyoscyamine  certain medicines for travel sickness like scopolamine  dexamethasone  dofetilide  ipratropium  NSAIDs, medicines for pain and inflammation, like ibuprofen or naproxen  other medicines for Alzheimer's disease  other medicines that prolong the QT interval (cause an abnormal heart rhythm)  phenobarbital  phenytoin  rifampin, rifabutin or rifapentine  ziprasidone This list may not describe all possible interactions. Give your health care provider a list of all the medicines, herbs, non-prescription drugs, or dietary supplements you use. Also tell them if you smoke, drink alcohol, or use illegal drugs. Some items may interact with your medicine. What should I watch for while using this medicine? Visit your doctor or health care professional for regular checks on your progress. Check with your doctor or health care professional if your symptoms do not get better or if they get worse. You may get drowsy or dizzy. Do not drive, use machinery, or do anything that needs mental alertness until you know how this drug affects you. What side effects may I notice from receiving this medicine? Side effects that you should report to your doctor or health care professional as soon as possible:    allergic reactions like skin rash, itching or hives, swelling of the face, lips, or tongue  feeling faint or lightheaded, falls  loss of bladder control  seizures  signs and  symptoms of a dangerous change in heartbeat or heart rhythm like chest pain; dizziness; fast or irregular heartbeat; palpitations; feeling faint or lightheaded, falls; breathing problems  signs and symptoms of infection like fever or chills; cough; sore throat; pain or trouble passing urine  signs and symptoms of liver injury like dark yellow or brown urine; general ill feeling or flu-like symptoms; light-colored stools; loss of appetite; nausea; right upper belly pain; unusually weak or tired; yellowing of the eyes or skin  slow heartbeat or palpitations  unusual bleeding or bruising  vomiting Side effects that usually do not require medical attention (report to your doctor or health care professional if they continue or are bothersome):  diarrhea, especially when starting treatment  headache  loss of appetite  muscle cramps  nausea  stomach upset This list may not describe all possible side effects. Call your doctor for medical advice about side effects. You may report side effects to FDA at 1-800-FDA-1088. Where should I keep my medicine? Keep out of reach of children. Store at room temperature between 15 and 30 degrees C (59 and 86 degrees F). Throw away any unused medicine after the expiration date. NOTE: This sheet is a summary. It may not cover all possible information. If you have questions about this medicine, talk to your doctor, pharmacist, or health care provider.  2020 Elsevier/Gold Standard (2018-08-06 10:33:41)  

## 2019-09-24 ENCOUNTER — Telehealth: Payer: Self-pay | Admitting: Cardiology

## 2019-09-24 NOTE — Telephone Encounter (Signed)
Note placed in appt notes to allow the pts wife up to his scheduled appt with Dr. [name] tomorrow 1/27.  Pt meets criteria to have a visitor, with known dementia. Wife is aware that both them need to wear their facial mask to the appt and during the entire duration of the appt. Wife verbalized understanding and agrees with this plan.

## 2019-09-24 NOTE — Telephone Encounter (Signed)
Patient's wife is calling requesting she attend the patient's upcoming appointment scheduled for 09/25/19 due to the patient having dementia.

## 2019-09-25 ENCOUNTER — Other Ambulatory Visit: Payer: Medicare Other | Admitting: *Deleted

## 2019-09-25 ENCOUNTER — Other Ambulatory Visit: Payer: Self-pay

## 2019-09-25 ENCOUNTER — Encounter: Payer: Self-pay | Admitting: Cardiology

## 2019-09-25 ENCOUNTER — Ambulatory Visit (INDEPENDENT_AMBULATORY_CARE_PROVIDER_SITE_OTHER): Payer: Medicare Other | Admitting: Cardiology

## 2019-09-25 VITALS — BP 122/64 | HR 48 | Ht 70.0 in | Wt 188.2 lb

## 2019-09-25 DIAGNOSIS — R001 Bradycardia, unspecified: Secondary | ICD-10-CM

## 2019-09-25 DIAGNOSIS — I471 Supraventricular tachycardia, unspecified: Secondary | ICD-10-CM

## 2019-09-25 DIAGNOSIS — R079 Chest pain, unspecified: Secondary | ICD-10-CM | POA: Diagnosis not present

## 2019-09-25 DIAGNOSIS — E782 Mixed hyperlipidemia: Secondary | ICD-10-CM

## 2019-09-25 DIAGNOSIS — I2510 Atherosclerotic heart disease of native coronary artery without angina pectoris: Secondary | ICD-10-CM | POA: Diagnosis not present

## 2019-09-25 DIAGNOSIS — E785 Hyperlipidemia, unspecified: Secondary | ICD-10-CM | POA: Diagnosis not present

## 2019-09-25 LAB — LIPID PANEL
Chol/HDL Ratio: 3.4 ratio (ref 0.0–5.0)
Cholesterol, Total: 203 mg/dL — ABNORMAL HIGH (ref 100–199)
HDL: 59 mg/dL (ref >39)
LDL Chol Calc (NIH): 126 mg/dL — ABNORMAL HIGH (ref 0–99)
Triglycerides: 100 mg/dL (ref 0–149)
VLDL Cholesterol Cal: 18 mg/dL (ref 5–40)

## 2019-09-25 NOTE — Progress Notes (Signed)
Cardiology Office Note    Date:  09/25/2019   [name], DOB 1941-03-11, MRN [id number]  PCP:  Leanna Battles, MD  Cardiologist: Ena Dawley, MD EPS: None  Reason for visit: 2-week follow-up  History of Present Illness:  [name] is a 79 y.o. male with history of chest pain and moderate nonobstructive CAD on coronary CT in 2015.  Statins were stopped because of memory problems.   I saw the patient in the office 10/31/2018 with atypical chest pain that was reproducible to touch on exam and worse when lifting weights with a trainer.  Chest pain was constant for several months and did not worsen when exercising on the treadmill or elliptical.  D dimer was Normal as was CBC and C met.  11/02/18 showed normal LVEF 65% with some diastolic dysfunction.  Patient was referred back by PCP because of recurrent chest pain and heart rate of 47.  Chest pain was felt to be musculoskeletal.  While wearing 30-day monitor we were called by the company he had PSVT 170 bpm and sinus bradycardia in the 40s.  I referred him to EP but after final monitor was read by Dr. [name] she felt he could hold off on EP referral.  Minimal heart rate was 38 that appeared to be sleeping hours and only the one episode of SVT.  The patient is coming accompanied by his wife, his only concern is memory loss, he still able to drive and perform all activities of daily living.  He denies any dizziness or falls.  He is not aware of any palpitations.  He has not had any recent episode of chest pain or shortness of breath.   Past Medical History:  Diagnosis Date  . Acid reflux    a. well-controlled with ranitidine.  Marland Kitchen BPH (benign prostatic hyperplasia)    a. s/p TURP 2012.  . Essential hypertension 09/10/2013  . Eye problem    a. L eye blurred vision - seeing eye doctor for tissue pulling away from eye.  . Hard of hearing   . Hiatal hernia    a. Small-mod by CXR 2015.  Marland Kitchen High cholesterol     Past Surgical  History:  Procedure Laterality Date  . PARS PLANA VITRECTOMY Left 2015   WITH MEMBRANE PEEL, 23 GAUGE  . TRANSURETHRAL RESECTION OF PROSTATE    . Urethral stricture surgery      Current Medications: Current Meds  Medication Sig  . acetaminophen (TYLENOL) 500 MG tablet Take 1,000 mg by mouth every 6 (six) hours as needed for mild pain or moderate pain.  Marland Kitchen aspirin 81 MG tablet Take 81 mg by mouth daily.  . Calcium Carbonate-Vitamin D (CALTRATE 600+D PO) Take 1 tablet by mouth daily.  Marland Kitchen COENZYME Q-10 PO Take 1 tablet by mouth daily.  Marland Kitchen desmopressin (DDAVP) 0.2 MG tablet Take 0.4 mg by mouth at bedtime.   . donepezil (ARICEPT) 5 MG tablet Take 1 tablet (5 mg total) by mouth at bedtime.  Marland Kitchen FERREX 150 150 MG capsule Take 150 mg by mouth daily.  Marland Kitchen gabapentin (NEURONTIN) 100 MG capsule TAKE 1 CAPSULE BY MOUTH EVERYDAY AT BEDTIME  . meclizine (ANTIVERT) 25 MG tablet Take 25 mg by mouth daily.  . memantine (NAMENDA) 5 MG tablet Take 1 tablet by mouth 2 (two) times daily.  . Multiple Vitamins-Minerals (CENTRUM SILVER PO) Take 1 tablet by mouth daily.  . Omega-3 Fatty Acids (FISH OIL) 1000 MG CAPS Take 1 capsule (  1,000 mg total) by mouth daily.  Vladimir Faster Glycol-Propyl Glycol (SYSTANE OP) Apply 2 drops to eye daily as needed (Dry eyes). 2 drops each eye  . Red Yeast Rice 600 MG CAPS Take 1 capsule (600 mg total) by mouth daily.  Marland Kitchen rOPINIRole (REQUIP) 1 MG tablet Take 1 tablet by mouth at bedtime.     Allergies:   Sulfa antibiotics, Sulfamethoxazole, and Sulfasalazine   Social History   Socioeconomic History  . Marital status: Married    Spouse name: June  . Number of children: Not on file  . Years of education: Not on file  . Highest education level: Not on file  Occupational History  . Not on file  Tobacco Use  . Smoking status: Never Smoker  . Smokeless tobacco: Never Used  Substance and Sexual Activity  . Alcohol use: Yes    Comment: Once per month  . Drug use: No  . Sexual  activity: Not on file  Other Topics Concern  . Not on file  Social History Narrative  . Not on file   Social Determinants of Health   Financial Resource Strain:   . Difficulty of Paying Living Expenses: Not on file  Food Insecurity:   . Worried About Charity fundraiser in the Last Year: Not on file  . Ran Out of Food in the Last Year: Not on file  Transportation Needs:   . Lack of Transportation (Medical): Not on file  . Lack of Transportation (Non-Medical): Not on file  Physical Activity:   . Days of Exercise per Week: Not on file  . Minutes of Exercise per Session: Not on file  Stress:   . Feeling of Stress : Not on file  Social Connections:   . Frequency of Communication with Friends and Family: Not on file  . Frequency of Social Gatherings with Friends and Family: Not on file  . Attends Religious Services: Not on file  . Active Member of Clubs or Organizations: Not on file  . Attends Archivist Meetings: Not on file  . Marital Status: Not on file    Family History:  The patient's  family history includes Dementia in his brother; Other in his mother; Stroke in his brother and father; Tuberculosis in his paternal grandfather.   ROS:   Please see the history of present illness.    ROS All other systems reviewed and are negative.  PHYSICAL EXAM:   VS:  BP 122/64   Pulse (!) 48   Ht 5' 10" (1.778 m)   Wt 188 lb 3.2 oz (85.4 kg)   SpO2 98%   BMI 27.00 kg/m   Physical Exam  GEN: Well nourished, well developed, in no acute distress  Neck: no JVD, carotid bruits, or masses Cardiac:RRR; no murmurs, rubs, or gallops, mildly tender to touch in the left sternal area Respiratory:  clear to auscultation bilaterally, normal work of breathing GI: soft, nontender, nondistended, + BS Ext: without cyanosis, clubbing, or edema, Good distal pulses bilaterally Neuro:  Alert and Oriented x 3 Psych: euthymic mood, full affect  Wt Readings from Last 3 Encounters:  09/25/19  188 lb 3.2 oz (85.4 kg)  09/18/19 190 lb (86.2 kg)  09/10/19 192 lb (87.1 kg)    Studies/Labs Reviewed:   EKG:  EKG is not ordered today.  Recent Labs: 10/31/2018: BUN 16; Creatinine, Ser 1.01; Hemoglobin 13.6; Platelets 238; Potassium 4.2; Sodium 138   Lipid Panel    Component Value Date/Time  CHOL 113 10/03/2013 0749   TRIG 72.0 10/03/2013 0749   HDL 50.50 10/03/2013 0749   CHOLHDL 2 10/03/2013 0749   VLDL 14.4 10/03/2013 0749   LDLCALC 48 10/03/2013 0749    Additional studies/ records that were reviewed today include:   Holter monitor 12/15/2020Sinus bradycardia to sinus rhythm.  Minimal heart rate 38 bpm, average heart rate 49 bpm.  No AV blocks, no pauses lasting more than 3 seconds, no arrhythmias.   Average heart rate 49 bpm, no AV blocks or pauses lasting more than 3 seconds.   2D echo 11/02/2018 IMPRESSIONS      1. The left ventricle has hyperdynamic systolic function, with an ejection fraction of >65%. The cavity size was normal. There is mildly increased left ventricular wall thickness. Left ventricular diastolic Doppler parameters are consistent with  impaired relaxation.  2. The right ventricle has normal systolic function. The cavity was normal. There is no increase in right ventricular wall thickness.  3. The mitral valve is normal in structure.  4. The tricuspid valve is normal in structure.  5. The aortic valve is tricuspid.   FINDINGS Coronary CTA 2015 FINDINGS: Coronary Arteries:  Originating in the normal position.   Left main has only minimal atherosclerotic plague. LM gives rise to LAD, small ramus intermedius and non-dominant LCX.   Ostial LAD has a mild mixed plague with 0-25% stenosis. LAD gives rise to a large diagonal branch (almost like dual LAD).   Proximal and mid LAD has a long moderate circumferential calcified plague with associated 25-50% stenosis. Distal LAD has only mild luminal irregularities.   First diagonal branch has  calcified plague with associated 0-25% stenosis.   LCX is medium size non-dominant vessel. There is mild calcified plague in its mid segment associated with 0-25% stenosis.   RCA is a large dominant vessel. There is mild noncalcified plague in the mid RCA associated with 25-50% stenosis. Distal RCA has minimal calcified plague associated with <25% stenosis.   RCA gives rise to a large acute marginal branch that supplies PDA territory and a large PLVB. They both have only minimal calcified plague with associated 0-25% stenosis.   IMPRESSION: 1. Moderate diffuse non-obstructive CAD. Aggressive medical management is recommended.   Ena Dawley    ASSESSMENT:    1. Sinus bradycardia   2. SVT (supraventricular tachycardia) ([place])   3. Coronary artery disease involving native coronary artery of native heart without angina pectoris   4. Mixed hyperlipidemia      PLAN:  In order of problems listed above:  Bradycardia/SVT with heart rate in the 40s lowest heart rate 38 during sleeping hours.  An episode of SVT 8 seconds long 170 bpm 07/29/2019-asymptomatic.  The patient is not aware of these episodes he has no palpitations or dizziness, no falls, he is advised to call us if he has any of those symptoms antibiotics we will proceed with any further work-up.  Nonobstructive CAD on CT 2015.  Continue aspirin only, he is not on statin given he is progressive memory impairment.  Hyperlipidemia on fish oil and red yeast rice.  Statin stopped because of memory loss.  LDL 113 03/2018.  For fasting lipid panel when he sees Dr. [name].  OSA compliant with CPAP  Follow-up in 6 months.  Medication Adjustments/Labs and Tests Ordered: Current medicines are reviewed at length with the patient today.  Concerns regarding medicines are outlined above.  Medication changes, Labs and Tests ordered today are listed in the Patient Instructions below.  Patient Instructions  Medication Instructions:    Your physician recommends that you continue on your current medications as directed. Please refer to the Current Medication list given to you today.  *If you need a refill on your cardiac medications before your next appointment, please call your pharmacy*    Follow-Up: At [facility], you and your health needs are our priority.  As part of our continuing mission to provide you with exceptional heart care, we have created designated Provider Care Teams.  These Care Teams include your primary Cardiologist (physician) and Advanced Practice Providers (APPs -  Physician Assistants and Nurse Practitioners) who all work together to provide you with the care you need, when you need it.  Your next appointment:   6 month(s)  The format for your next appointment:   In Person  Provider:   Ena Dawley, MD       Signed, Ena Dawley, MD  09/25/2019 11:11 AM    [facility] [place], [place], [place]  [address] Phone: [phone]; Fax: [phone]

## 2019-09-25 NOTE — Patient Instructions (Signed)
Medication Instructions:   Your physician recommends that you continue on your current medications as directed. Please refer to the Current Medication list given to you today.  *If you need a refill on your cardiac medications before your next appointment, please call your pharmacy*    Follow-Up: At [facility], you and your health needs are our priority.  As part of our continuing mission to provide you with exceptional heart care, we have created designated Provider Care Teams.  These Care Teams include your primary Cardiologist (physician) and Advanced Practice Providers (APPs -  Physician Assistants and Nurse Practitioners) who all work together to provide you with the care you need, when you need it.  Your next appointment:   6 month(s)  The format for your next appointment:   In Person  Provider:   Katarina Nelson, MD    

## 2019-10-02 DIAGNOSIS — K591 Functional diarrhea: Secondary | ICD-10-CM | POA: Diagnosis not present

## 2019-10-27 ENCOUNTER — Ambulatory Visit: Payer: Medicare Other | Attending: Internal Medicine

## 2019-10-27 DIAGNOSIS — Z23 Encounter for immunization: Secondary | ICD-10-CM

## 2019-10-27 NOTE — Progress Notes (Signed)
   Covid-19 Vaccination Clinic  Name:  [name]    MRN: [id number] DOB: 09-Dec-1940  10/27/2019  [name] was observed post Covid-19 immunization for 15 minutes without incidence. He was provided with Vaccine Information Sheet and instruction to access the V-Safe system.   [name] was instructed to call 911 with any severe reactions post vaccine: Marland Kitchen Difficulty breathing  . Swelling of your face and throat  . A fast heartbeat  . A bad rash all over your body  . Dizziness and weakness    Immunizations Administered    Name Date Dose VIS Date Route   Pfizer COVID-19 Vaccine 10/27/2019 11:47 AM 0.3 mL 08/09/2019 Intramuscular   Manufacturer: [place]   Lot: [id number]   [place]: [id number]

## 2019-11-11 DIAGNOSIS — K591 Functional diarrhea: Secondary | ICD-10-CM | POA: Diagnosis not present

## 2019-11-18 DIAGNOSIS — E7849 Other hyperlipidemia: Secondary | ICD-10-CM | POA: Diagnosis not present

## 2019-11-18 DIAGNOSIS — Z125 Encounter for screening for malignant neoplasm of prostate: Secondary | ICD-10-CM | POA: Diagnosis not present

## 2019-11-20 DIAGNOSIS — R82998 Other abnormal findings in urine: Secondary | ICD-10-CM | POA: Diagnosis not present

## 2019-11-26 ENCOUNTER — Ambulatory Visit: Payer: Medicare Other | Attending: Internal Medicine

## 2019-11-26 DIAGNOSIS — Z0000 Encounter for general adult medical examination without abnormal findings: Secondary | ICD-10-CM | POA: Diagnosis not present

## 2019-11-26 DIAGNOSIS — G4733 Obstructive sleep apnea (adult) (pediatric): Secondary | ICD-10-CM | POA: Diagnosis not present

## 2019-11-26 DIAGNOSIS — E7849 Other hyperlipidemia: Secondary | ICD-10-CM | POA: Diagnosis not present

## 2019-11-26 DIAGNOSIS — R972 Elevated prostate specific antigen [PSA]: Secondary | ICD-10-CM | POA: Diagnosis not present

## 2019-11-26 DIAGNOSIS — F0390 Unspecified dementia without behavioral disturbance: Secondary | ICD-10-CM | POA: Diagnosis not present

## 2019-11-26 DIAGNOSIS — Z23 Encounter for immunization: Secondary | ICD-10-CM

## 2019-11-26 DIAGNOSIS — I10 Essential (primary) hypertension: Secondary | ICD-10-CM | POA: Diagnosis not present

## 2019-11-26 DIAGNOSIS — M545 Low back pain: Secondary | ICD-10-CM | POA: Diagnosis not present

## 2019-11-26 DIAGNOSIS — M25551 Pain in right hip: Secondary | ICD-10-CM | POA: Diagnosis not present

## 2019-11-26 DIAGNOSIS — R3121 Asymptomatic microscopic hematuria: Secondary | ICD-10-CM | POA: Diagnosis not present

## 2019-11-26 NOTE — Progress Notes (Signed)
   Covid-19 Vaccination Clinic  Name:  [name]    MRN: [id number] DOB: 01/21/1941  11/26/2019  [name] was observed post Covid-19 immunization for 15 minutes without incident. He was provided with Vaccine Information Sheet and instruction to access the V-Safe system.   [name] was instructed to call 911 with any severe reactions post vaccine: Marland Kitchen Difficulty breathing  . Swelling of face and throat  . A fast heartbeat  . A bad rash all over body  . Dizziness and weakness   Immunizations Administered    Name Date Dose VIS Date Route   Pfizer COVID-19 Vaccine 11/26/2019 11:10 AM 0.3 mL 08/09/2019 Intramuscular   Manufacturer: [employer], [employer]   Lot: [id number]   [place]: [id number]

## 2019-11-27 DIAGNOSIS — Z1212 Encounter for screening for malignant neoplasm of rectum: Secondary | ICD-10-CM | POA: Diagnosis not present

## 2019-12-17 ENCOUNTER — Ambulatory Visit: Payer: Medicare Other | Admitting: Adult Health

## 2019-12-18 DIAGNOSIS — N400 Enlarged prostate without lower urinary tract symptoms: Secondary | ICD-10-CM | POA: Diagnosis not present

## 2019-12-18 DIAGNOSIS — R82998 Other abnormal findings in urine: Secondary | ICD-10-CM | POA: Diagnosis not present

## 2019-12-18 DIAGNOSIS — Z125 Encounter for screening for malignant neoplasm of prostate: Secondary | ICD-10-CM | POA: Diagnosis not present

## 2019-12-18 DIAGNOSIS — I10 Essential (primary) hypertension: Secondary | ICD-10-CM | POA: Diagnosis not present

## 2019-12-31 ENCOUNTER — Other Ambulatory Visit: Payer: Self-pay

## 2019-12-31 ENCOUNTER — Encounter: Payer: Self-pay | Admitting: Adult Health

## 2019-12-31 ENCOUNTER — Ambulatory Visit (INDEPENDENT_AMBULATORY_CARE_PROVIDER_SITE_OTHER): Payer: Medicare Other | Admitting: Adult Health

## 2019-12-31 VITALS — BP 111/58 | HR 64 | Temp 97.3°F | Ht 70.0 in | Wt 188.0 lb

## 2019-12-31 DIAGNOSIS — I2510 Atherosclerotic heart disease of native coronary artery without angina pectoris: Secondary | ICD-10-CM | POA: Diagnosis not present

## 2019-12-31 DIAGNOSIS — Z9989 Dependence on other enabling machines and devices: Secondary | ICD-10-CM | POA: Diagnosis not present

## 2019-12-31 DIAGNOSIS — G4733 Obstructive sleep apnea (adult) (pediatric): Secondary | ICD-10-CM

## 2019-12-31 NOTE — Progress Notes (Signed)
PATIENT: [name] DOB: 1941/03/07  REASON FOR VISIT: follow up HISTORY FROM: patient  HISTORY OF PRESENT ILLNESS: Today 12/31/19:  Mr. Rayhill is a 79 year old male with a history of obstructive sleep apnea on CPAP.  He returns today for follow-up.  His download indicates that he uses machine 29 out of 30 days for compliance of 97%.  He uses machine greater than 4 hours 28 days for compliance of 93%.  On average he uses his machine 7 hours and 44 minutes.  His residual AHI is 8.4 on 5 to 10 cm of water with EPR 3.  Leak in the 95th percentile is 35.4 L/min.  Pressure in the 95th percentile is 9.3 and maximum pressure of 9.8.  Reports that he is tolerating the machine better.  He has found it beneficial.    REVIEW OF SYSTEMS: Out of a complete 14 system review of symptoms, the patient complains only of the following symptoms, and all other reviewed systems are negative.  FSS 11 ESS 5  ALLERGIES: Allergies  Allergen Reactions  . Sulfa Antibiotics Nausea Only and Rash  . Sulfamethoxazole Nausea Only and Rash  . Sulfasalazine Nausea Only and Rash    HOME MEDICATIONS: Outpatient Medications Prior to Visit  Medication Sig Dispense Refill  . acetaminophen (TYLENOL) 500 MG tablet Take 1,000 mg by mouth every 6 (six) hours as needed for mild pain or moderate pain.    Marland Kitchen aspirin 81 MG tablet Take 81 mg by mouth daily.    . Calcium Carbonate-Vitamin D (CALTRATE 600+D PO) Take 1 tablet by mouth daily.    Marland Kitchen COENZYME Q-10 PO Take 1 tablet by mouth daily.    Marland Kitchen desmopressin (DDAVP) 0.2 MG tablet Take 0.4 mg by mouth at bedtime.   0  . donepezil (ARICEPT) 5 MG tablet Take 1 tablet (5 mg total) by mouth at bedtime. 30 tablet 5  . FERREX 150 150 MG capsule Take 150 mg by mouth daily.    Marland Kitchen gabapentin (NEURONTIN) 100 MG capsule TAKE 1 CAPSULE BY MOUTH EVERYDAY AT BEDTIME  1  . meclizine (ANTIVERT) 25 MG tablet Take 25 mg by mouth daily.    . memantine (NAMENDA) 5 MG tablet Take 1 tablet by  mouth 2 (two) times daily.    . Multiple Vitamins-Minerals (CENTRUM SILVER PO) Take 1 tablet by mouth daily.    . Omega-3 Fatty Acids (FISH OIL) 1000 MG CAPS Take 1 capsule (1,000 mg total) by mouth daily.  0  . Polyethyl Glycol-Propyl Glycol (SYSTANE OP) Apply 2 drops to eye daily as needed (Dry eyes). 2 drops each eye    . Red Yeast Rice 600 MG CAPS Take 1 capsule (600 mg total) by mouth daily. 90 capsule 2  . rOPINIRole (REQUIP) 1 MG tablet Take 1 tablet by mouth at bedtime.     No facility-administered medications prior to visit.    PAST MEDICAL HISTORY: Past Medical History:  Diagnosis Date  . Acid reflux    a. well-controlled with ranitidine.  Marland Kitchen BPH (benign prostatic hyperplasia)    a. s/p TURP 2012.  . Essential hypertension 09/10/2013  . Eye problem    a. L eye blurred vision - seeing eye doctor for tissue pulling away from eye.  . Hard of hearing   . Hiatal hernia    a. Small-mod by CXR 2015.  Marland Kitchen High cholesterol     PAST SURGICAL HISTORY: Past Surgical History:  Procedure Laterality Date  . PARS PLANA VITRECTOMY Left  2015   WITH MEMBRANE PEEL, 23 GAUGE  . TRANSURETHRAL RESECTION OF PROSTATE    . Urethral stricture surgery      FAMILY HISTORY: Family History  Problem Relation Age of Onset  . Other Mother        Died at 74 - "old age"  . Stroke Father        Died at 71  . Tuberculosis Paternal Grandfather   . Dementia Brother   . Stroke Brother   . CAD Neg Hx     SOCIAL HISTORY: Social History   Socioeconomic History  . Marital status: Married    Spouse name: June  . Number of children: Not on file  . Years of education: Not on file  . Highest education level: Not on file  Occupational History  . Not on file  Tobacco Use  . Smoking status: Never Smoker  . Smokeless tobacco: Never Used  Substance and Sexual Activity  . Alcohol use: Yes    Comment: Once per month  . Drug use: No  . Sexual activity: Not on file  Other Topics Concern  . Not on  file  Social History Narrative  . Not on file   Social Determinants of Health   Financial Resource Strain:   . Difficulty of Paying Living Expenses:   Food Insecurity:   . Worried About Charity fundraiser in the Last Year:   . Arboriculturist in the Last Year:   Transportation Needs:   . Film/video editor (Medical):   Marland Kitchen Lack of Transportation (Non-Medical):   Physical Activity:   . Days of Exercise per Week:   . Minutes of Exercise per Session:   Stress:   . Feeling of Stress :   Social Connections:   . Frequency of Communication with Friends and Family:   . Frequency of Social Gatherings with Friends and Family:   . Attends Religious Services:   . Active Member of Clubs or Organizations:   . Attends Archivist Meetings:   Marland Kitchen Marital Status:   Intimate Partner Violence:   . Fear of Current or Ex-Partner:   . Emotionally Abused:   Marland Kitchen Physically Abused:   . Sexually Abused:       PHYSICAL EXAM  Vitals:   12/31/19 1042  BP: (!) 111/58  Pulse: 64  Temp: (!) 97.3 F (36.3 C)  Weight: 188 lb (85.3 kg)  Height: 5\' 10"  (1.778 m)   Body mass index is 26.98 kg/m.  Generalized: Well developed, in no acute distress  Chest: Lungs clear to auscultation bilaterally  Neurological examination  Mentation: Alert oriented to time, place, history taking. Follows all commands speech and language fluent Cranial nerve II-XII: Extraocular movements were full, visual field were full on confrontational test Head turning and shoulder shrug  were normal and symmetric. Motor: The motor testing reveals 5 over 5 strength of all 4 extremities. Good symmetric motor tone is noted throughout.  Sensory: Sensory testing is intact to soft touch on all 4 extremities. No evidence of extinction is noted.  Gait and station: Gait is normal.    DIAGNOSTIC DATA (LABS, IMAGING, TESTING) - I reviewed patient records, labs, notes, testing and imaging myself where available.  Lab Results    Component Value Date   WBC 6.0 10/31/2018   HGB 13.6 10/31/2018   HCT 39.6 10/31/2018   MCV 92 10/31/2018   PLT 238 10/31/2018      Component Value Date/Time   NA  138 10/31/2018 0950   K 4.2 10/31/2018 0950   CL 105 10/31/2018 0950   CO2 27 10/31/2018 0950   GLUCOSE 108 (H) 10/31/2018 0950   GLUCOSE 106 (H) 01/20/2015 0930   BUN 16 10/31/2018 0950   CREATININE 1.01 10/31/2018 0950   CALCIUM 8.8 10/31/2018 0950   PROT 6.1 (L) 01/20/2015 0930   PROT 6.0 02/10/2014 0956   ALBUMIN 3.9 01/20/2015 0930   ALBUMIN 4.5 02/10/2014 0956   AST 28 01/20/2015 0930   ALT 24 01/20/2015 0930   ALKPHOS 48 01/20/2015 0930   BILITOT 0.9 01/20/2015 0930   GFRNONAA 71 10/31/2018 0950   GFRAA 82 10/31/2018 0950   Lab Results  Component Value Date   CHOL 203 (H) 09/25/2019   HDL 59 09/25/2019   LDLCALC 126 (H) 09/25/2019   TRIG 100 09/25/2019   CHOLHDL 3.4 09/25/2019     ASSESSMENT AND PLAN 79 y.o. year old male  has a past medical history of Acid reflux, BPH (benign prostatic hyperplasia), Essential hypertension (09/10/2013), Eye problem, Hard of hearing, Hiatal hernia, and High cholesterol. here with:  1. OSA on CPAP  - CPAP compliance excellent -  residual AHI is slightly elevated--increase pressure 5-15 cmH2O - Encourage patient to use CPAP nightly and > 4 hours each night - F/U in 1 year or sooner if needed   I spent 20 minutes of face-to-face and non-face-to-face time with patient.  This included previsit chart review, lab review, study review, order entry, electronic health record documentation, patient education.  Ward Givens, MSN, NP-C 12/31/2019, 10:40 AM [facility] [address] [place], [place] [address] [phone]

## 2019-12-31 NOTE — Patient Instructions (Signed)
Continue using CPAP nightly and greater than 4 hours each night Increase pressure 5-15 cmh20 If your symptoms worsen or you develop new symptoms please let us know.

## 2019-12-31 NOTE — Progress Notes (Signed)
Order for cpap supplies sent to [facility] via [place] [employer]. Confirmation received that the order transmitted was successful.

## 2020-01-02 ENCOUNTER — Other Ambulatory Visit: Payer: Self-pay | Admitting: Neurology

## 2020-01-13 ENCOUNTER — Telehealth: Payer: Self-pay | Admitting: Adult Health

## 2020-01-13 NOTE — Telephone Encounter (Signed)
[place],June(wife on DPR) has called to report that they have not been contacted by [facility] re: the change in settings yet.  Pt's wife is asking for a call to discuss

## 2020-01-13 NOTE — Telephone Encounter (Signed)
Jacquelyne Balint, RN We made the changes and left a vm on 12/31/19. Will call again.

## 2020-01-13 NOTE — Telephone Encounter (Signed)
Sent message to [employer].

## 2020-01-14 NOTE — Telephone Encounter (Signed)
RE: cpap Received: Today Message Contents  Towanda Octave, RN; Charmian Muff Good morning,   I just checked patients pressures in [employer] and his settings are correct at 5-15 cm. I will give the patient a call this morning.   Margreta Journey

## 2020-01-14 NOTE — Telephone Encounter (Signed)
I called and LMVM for pt and wife that did CM [facility] and they have the pressure setting as changed 5-15cm as per ordered.  She will try to connect with you today.

## 2020-01-22 NOTE — Telephone Encounter (Signed)
Error

## 2020-02-12 DIAGNOSIS — L570 Actinic keratosis: Secondary | ICD-10-CM | POA: Diagnosis not present

## 2020-02-12 DIAGNOSIS — D2372 Other benign neoplasm of skin of left lower limb, including hip: Secondary | ICD-10-CM | POA: Diagnosis not present

## 2020-02-12 DIAGNOSIS — Z85828 Personal history of other malignant neoplasm of skin: Secondary | ICD-10-CM | POA: Diagnosis not present

## 2020-02-12 DIAGNOSIS — D1801 Hemangioma of skin and subcutaneous tissue: Secondary | ICD-10-CM | POA: Diagnosis not present

## 2020-02-12 DIAGNOSIS — D225 Melanocytic nevi of trunk: Secondary | ICD-10-CM | POA: Diagnosis not present

## 2020-02-12 DIAGNOSIS — L218 Other seborrheic dermatitis: Secondary | ICD-10-CM | POA: Diagnosis not present

## 2020-02-12 DIAGNOSIS — L821 Other seborrheic keratosis: Secondary | ICD-10-CM | POA: Diagnosis not present

## 2020-04-09 DIAGNOSIS — G2581 Restless legs syndrome: Secondary | ICD-10-CM | POA: Diagnosis not present

## 2020-04-09 DIAGNOSIS — G4733 Obstructive sleep apnea (adult) (pediatric): Secondary | ICD-10-CM | POA: Diagnosis not present

## 2020-04-09 DIAGNOSIS — M545 Low back pain: Secondary | ICD-10-CM | POA: Diagnosis not present

## 2020-04-09 DIAGNOSIS — G8929 Other chronic pain: Secondary | ICD-10-CM | POA: Diagnosis not present

## 2020-04-09 DIAGNOSIS — M25561 Pain in right knee: Secondary | ICD-10-CM | POA: Diagnosis not present

## 2020-04-09 DIAGNOSIS — I10 Essential (primary) hypertension: Secondary | ICD-10-CM | POA: Diagnosis not present

## 2020-05-19 ENCOUNTER — Other Ambulatory Visit: Payer: Self-pay

## 2020-05-19 ENCOUNTER — Ambulatory Visit (INDEPENDENT_AMBULATORY_CARE_PROVIDER_SITE_OTHER): Payer: Medicare Other | Admitting: Cardiology

## 2020-05-19 ENCOUNTER — Encounter: Payer: Self-pay | Admitting: Cardiology

## 2020-05-19 VITALS — BP 130/80 | HR 52 | Ht 70.0 in | Wt 199.4 lb

## 2020-05-19 DIAGNOSIS — I10 Essential (primary) hypertension: Secondary | ICD-10-CM

## 2020-05-19 DIAGNOSIS — R001 Bradycardia, unspecified: Secondary | ICD-10-CM

## 2020-05-19 DIAGNOSIS — I2510 Atherosclerotic heart disease of native coronary artery without angina pectoris: Secondary | ICD-10-CM

## 2020-05-19 DIAGNOSIS — E782 Mixed hyperlipidemia: Secondary | ICD-10-CM | POA: Diagnosis not present

## 2020-05-19 NOTE — Patient Instructions (Signed)
Medication Instructions:   Your physician recommends that you continue on your current medications as directed. Please refer to the Current Medication list given to you today.  *If you need a refill on your cardiac medications before your next appointment, please call your pharmacy*   Follow-Up: At [facility], you and your health needs are our priority.  As part of our continuing mission to provide you with exceptional heart care, we have created designated Provider Care Teams.  These Care Teams include your primary Cardiologist (physician) and Advanced Practice Providers (APPs -  Physician Assistants and Nurse Practitioners) who all work together to provide you with the care you need, when you need it.  We recommend signing up for the patient portal called "MyChart".  Sign up information is provided on this After Visit Summary.  MyChart is used to connect with patients for Virtual Visits (Telemedicine).  Patients are able to view lab/test results, encounter notes, upcoming appointments, etc.  Non-urgent messages can be sent to your provider as well.   To learn more about what you can do with MyChart, go to [email or web address].    Your next appointment:   12 month(s)  The format for your next appointment:   In Person  Provider:   Ena Dawley, MD

## 2020-05-19 NOTE — Progress Notes (Signed)
Cardiology Office Note    Date:  05/19/2020   [name], DOB [birth year], MRN [id number]  PCP:  Leanna Battles, MD  Cardiologist: Ena Dawley, MD EPS: None  Reason for visit: 8 months follow-up  History of Present Illness:  [name] is a 79 y.o. male with history of chest pain and moderate nonobstructive CAD on coronary CT in 2015.  Statins were stopped because of memory problems.   I saw the patient in the office 10/31/2018 with atypical chest pain that was reproducible to touch on exam and worse when lifting weights with a trainer.  Chest pain was constant for several months and did not worsen when exercising on the treadmill or elliptical.  D dimer was Normal as was CBC and C met.  11/02/18 showed normal LVEF 65% with some diastolic dysfunction.  Patient was referred back by PCP because of recurrent chest pain and heart rate of 47.  Chest pain was felt to be musculoskeletal.  While wearing 30-day monitor we were called by the company he had PSVT 170 bpm and sinus bradycardia in the 40s.  I referred him to EP but after final monitor was read by Dr. [name] she felt he could hold off on EP referral.  Minimal heart rate was 38 that appeared to be sleeping hours and only the one episode of SVT.  The patient is coming after 8 months, he has been doing really well, he continues to have slow heart rates however has no dizziness presyncope or syncope.  He denies any chest pain or shortness of breath.  He keeps himself busy and is able to perform all activities of daily living.  He has been compliant with his medications and has no side effects.   Past Medical History:  Diagnosis Date  . Acid reflux    a. well-controlled with ranitidine.  Marland Kitchen BPH (benign prostatic hyperplasia)    a. s/p TURP 2012.  . Essential hypertension 09/10/2013  . Eye problem    a. L eye blurred vision - seeing eye doctor for tissue pulling away from eye.  . Hard of hearing   . Hiatal hernia    a.  Small-mod by CXR 2015.  Marland Kitchen High cholesterol     Past Surgical History:  Procedure Laterality Date  . PARS PLANA VITRECTOMY Left 2015   WITH MEMBRANE PEEL, 23 GAUGE  . TRANSURETHRAL RESECTION OF PROSTATE    . Urethral stricture surgery      Current Medications: Current Meds  Medication Sig  . acetaminophen (TYLENOL) 500 MG tablet Take 1,000 mg by mouth every 6 (six) hours as needed for mild pain or moderate pain.  Marland Kitchen aspirin 81 MG tablet Take 81 mg by mouth daily.  . Calcium Carbonate-Vitamin D (CALTRATE 600+D PO) Take 1 tablet by mouth daily.  Marland Kitchen COENZYME Q-10 PO Take 1 tablet by mouth daily.  Marland Kitchen desmopressin (DDAVP) 0.2 MG tablet Take 0.4 mg by mouth at bedtime.   . donepezil (ARICEPT) 5 MG tablet TAKE 1 TABLET BY MOUTH EVERYDAY AT BEDTIME  . FERREX 150 150 MG capsule Take 150 mg by mouth daily.  Marland Kitchen gabapentin (NEURONTIN) 100 MG capsule TAKE 1 CAPSULE BY MOUTH EVERYDAY AT BEDTIME  . meclizine (ANTIVERT) 25 MG tablet Take 25 mg by mouth daily.  . memantine (NAMENDA) 5 MG tablet Take 1 tablet by mouth 2 (two) times daily.  . Multiple Vitamins-Minerals (CENTRUM SILVER PO) Take 1 tablet by mouth daily.  . Omega-3 Fatty Acids (  FISH OIL) 1000 MG CAPS Take 1 capsule (1,000 mg total) by mouth daily.  Vladimir Faster Glycol-Propyl Glycol (SYSTANE OP) Apply 2 drops to eye daily as needed (Dry eyes). 2 drops each eye  . Red Yeast Rice 600 MG CAPS Take 1 capsule (600 mg total) by mouth daily.  Marland Kitchen rOPINIRole (REQUIP) 1 MG tablet Take 2 tablets by mouth at bedtime.      Allergies:   Sulfa antibiotics, Sulfamethoxazole, and Sulfasalazine   Social History   Socioeconomic History  . Marital status: Married    Spouse name: June  . Number of children: Not on file  . Years of education: Not on file  . Highest education level: Not on file  Occupational History  . Not on file  Tobacco Use  . Smoking status: Never Smoker  . Smokeless tobacco: Never Used  Vaping Use  . Vaping Use: Never used    Substance and Sexual Activity  . Alcohol use: Yes    Comment: Once per month  . Drug use: No  . Sexual activity: Not on file  Other Topics Concern  . Not on file  Social History Narrative  . Not on file   Social Determinants of Health   Financial Resource Strain:   . Difficulty of Paying Living Expenses: Not on file  Food Insecurity:   . Worried About Charity fundraiser in the Last Year: Not on file  . Ran Out of Food in the Last Year: Not on file  Transportation Needs:   . Lack of Transportation (Medical): Not on file  . Lack of Transportation (Non-Medical): Not on file  Physical Activity:   . Days of Exercise per Week: Not on file  . Minutes of Exercise per Session: Not on file  Stress:   . Feeling of Stress : Not on file  Social Connections:   . Frequency of Communication with Friends and Family: Not on file  . Frequency of Social Gatherings with Friends and Family: Not on file  . Attends Religious Services: Not on file  . Active Member of Clubs or Organizations: Not on file  . Attends Archivist Meetings: Not on file  . Marital Status: Not on file    Family History:  The patient's  family history includes Dementia in his brother; Other in his mother; Stroke in his brother and father; Tuberculosis in his paternal grandfather.   ROS:   Please see the history of present illness.    ROS All other systems reviewed and are negative.  PHYSICAL EXAM:   VS:  BP 130/80   Pulse (!) 52   Ht 5' 10" (1.778 m)   Wt 199 lb 6.4 oz (90.4 kg)   SpO2 99%   BMI 28.61 kg/m   Physical Exam  GEN: Well nourished, well developed, in no acute distress  Neck: no JVD, carotid bruits, or masses Cardiac:RRR; no murmurs, rubs, or gallops, mildly tender to touch in the left sternal area Respiratory:  clear to auscultation bilaterally, normal work of breathing GI: soft, nontender, nondistended, + BS Ext: without cyanosis, clubbing, or edema, Good distal pulses bilaterally Neuro:   Alert and Oriented x 3 Psych: euthymic mood, full affect  Wt Readings from Last 3 Encounters:  05/19/20 199 lb 6.4 oz (90.4 kg)  12/31/19 188 lb (85.3 kg)  09/25/19 188 lb 3.2 oz (85.4 kg)    Studies/Labs Reviewed:   EKG:  EKG is ordered today.  It shows sinus bradycardia, 52 bpm, otherwise normal  EKG unchanged from prior.  This was personally reviewed.  Recent Labs: No results found for requested labs within last 8760 hours.   Lipid Panel    Component Value Date/Time   CHOL 203 (H) 09/25/2019 0906   TRIG 100 09/25/2019 0906   HDL 59 09/25/2019 0906   CHOLHDL 3.4 09/25/2019 0906   CHOLHDL 2 10/03/2013 0749   VLDL 14.4 10/03/2013 0749   LDLCALC 126 (H) 09/25/2019 0906    Additional studies/ records that were reviewed today include:   Holter monitor 12/15/2020Sinus bradycardia to sinus rhythm.  Minimal heart rate 38 bpm, average heart rate 49 bpm.  No AV blocks, no pauses lasting more than 3 seconds, no arrhythmias.   Average heart rate 49 bpm, no AV blocks or pauses lasting more than 3 seconds.   2D echo 11/02/2018 IMPRESSIONS      1. The left ventricle has hyperdynamic systolic function, with an ejection fraction of >65%. The cavity size was normal. There is mildly increased left ventricular wall thickness. Left ventricular diastolic Doppler parameters are consistent with  impaired relaxation.  2. The right ventricle has normal systolic function. The cavity was normal. There is no increase in right ventricular wall thickness.  3. The mitral valve is normal in structure.  4. The tricuspid valve is normal in structure.  5. The aortic valve is tricuspid.   FINDINGS Coronary CTA 2015 FINDINGS: Coronary Arteries:  Originating in the normal position.   Left main has only minimal atherosclerotic plague. LM gives rise to LAD, small ramus intermedius and non-dominant LCX.   Ostial LAD has a mild mixed plague with 0-25% stenosis. LAD gives rise to a large diagonal branch  (almost like dual LAD).   Proximal and mid LAD has a long moderate circumferential calcified plague with associated 25-50% stenosis. Distal LAD has only mild luminal irregularities.   First diagonal branch has calcified plague with associated 0-25% stenosis.   LCX is medium size non-dominant vessel. There is mild calcified plague in its mid segment associated with 0-25% stenosis.   RCA is a large dominant vessel. There is mild noncalcified plague in the mid RCA associated with 25-50% stenosis. Distal RCA has minimal calcified plague associated with <25% stenosis.   RCA gives rise to a large acute marginal branch that supplies PDA territory and a large PLVB. They both have only minimal calcified plague with associated 0-25% stenosis.   IMPRESSION: 1. Moderate diffuse non-obstructive CAD. Aggressive medical management is recommended.   Ena Dawley    ASSESSMENT:    1. Coronary artery disease involving native coronary artery of native heart without angina pectoris   2. Mixed hyperlipidemia   3. Sinus bradycardia   4. Essential hypertension      PLAN:  In order of problems listed above:  Bradycardia/SVT - his heart rate remains low however he is asymptomatic.  No recent palpitations.  Nonobstructive CAD on CT 2015.  Continue aspirin only, he is not on statin given he is progressive memory impairment.  Hyperlipidemia on fish oil and red yeast rice.  Statin stopped because of memory loss.  His most recent lipid panel showed HDL 50, LDL 124 and triglycerides 78.  OSA compliant with CPAP  Follow-up in 6 months.  Medication Adjustments/Labs and Tests Ordered: Current medicines are reviewed at length with the patient today.  Concerns regarding medicines are outlined above.  Medication changes, Labs and Tests ordered today are listed in the Patient Instructions below. Patient Instructions  Medication Instructions:   Your  physician recommends that you continue on your  current medications as directed. Please refer to the Current Medication list given to you today.  *If you need a refill on your cardiac medications before your next appointment, please call your pharmacy*   Follow-Up: At [facility], you and your health needs are our priority.  As part of our continuing mission to provide you with exceptional heart care, we have created designated Provider Care Teams.  These Care Teams include your primary Cardiologist (physician) and Advanced Practice Providers (APPs -  Physician Assistants and Nurse Practitioners) who all work together to provide you with the care you need, when you need it.  We recommend signing up for the patient portal called "MyChart".  Sign up information is provided on this After Visit Summary.  MyChart is used to connect with patients for Virtual Visits (Telemedicine).  Patients are able to view lab/test results, encounter notes, upcoming appointments, etc.  Non-urgent messages can be sent to your provider as well.   To learn more about what you can do with MyChart, go to [email or web address].    Your next appointment:   12 month(s)  The format for your next appointment:   In Person  Provider:   Ena Dawley, MD       Signed, Ena Dawley, MD  05/19/2020 5:20 PM    [facility] [place], [place], [place]  [address] Phone: [phone]; Fax: [phone]

## 2020-05-26 DIAGNOSIS — M25551 Pain in right hip: Secondary | ICD-10-CM | POA: Diagnosis not present

## 2020-05-26 DIAGNOSIS — G2581 Restless legs syndrome: Secondary | ICD-10-CM | POA: Diagnosis not present

## 2020-05-26 DIAGNOSIS — F0390 Unspecified dementia without behavioral disturbance: Secondary | ICD-10-CM | POA: Diagnosis not present

## 2020-05-26 DIAGNOSIS — Z23 Encounter for immunization: Secondary | ICD-10-CM | POA: Diagnosis not present

## 2020-05-26 DIAGNOSIS — M545 Low back pain: Secondary | ICD-10-CM | POA: Diagnosis not present

## 2020-05-26 DIAGNOSIS — E785 Hyperlipidemia, unspecified: Secondary | ICD-10-CM | POA: Diagnosis not present

## 2020-05-26 DIAGNOSIS — I10 Essential (primary) hypertension: Secondary | ICD-10-CM | POA: Diagnosis not present

## 2020-06-27 ENCOUNTER — Ambulatory Visit: Payer: Medicare Other | Attending: Internal Medicine

## 2020-06-27 DIAGNOSIS — Z23 Encounter for immunization: Secondary | ICD-10-CM

## 2020-06-27 NOTE — Progress Notes (Signed)
   Covid-19 Vaccination Clinic  Name:  [name]    MRN: [id number] DOB: 12/20/1940  06/27/2020  [name] was observed post Covid-19 immunization for 15 minutes without incident. He was provided with Vaccine Information Sheet and instruction to access the V-Safe system.   [name] was instructed to call 911 with any severe reactions post vaccine: Marland Kitchen Difficulty breathing  . Swelling of face and throat  . A fast heartbeat  . A bad rash all over body  . Dizziness and weakness

## 2020-07-04 ENCOUNTER — Other Ambulatory Visit: Payer: Self-pay | Admitting: Neurology

## 2020-07-05 NOTE — Progress Notes (Signed)
PATIENT: [name] DOB: 01-08-1941  REASON FOR VISIT: follow up HISTORY FROM: patient  HISTORY OF PRESENT ILLNESS: Today 07/05/20: [name] is an 79 year old male with a history of obstructive sleep apnea on CPAP.  His download indicates that he use his machine nightly for compliance of 100%.  He uses machine greater than 4 hours each night.  On average he uses his machine 8 hours and 5 minutes.  His residual AHI is 9.9 on 5 to 15 cm of water with EPR 3.  At the last visit we increased his pressure 5-10 but his apnea has worsened.  According to his download the majority of his events are central.  Therefore we may need to consider BiPAP therapy.  HISTORY  12/31/19:  [name] is a 79 year old male with a history of obstructive sleep apnea on CPAP.  He returns today for follow-up.  His download indicates that he uses machine 29 out of 30 days for compliance of 97%.  He uses machine greater than 4 hours 28 days for compliance of 93%.  On average he uses his machine 7 hours and 44 minutes.  His residual AHI is 8.4 on 5 to 10 cm of water with EPR 3.  Leak in the 95th percentile is 35.4 L/min.  Pressure in the 95th percentile is 9.3 and maximum pressure of 9.8.  Reports that he is tolerating the machine better.  He has found it beneficial.  REVIEW OF SYSTEMS: Out of a complete 14 system review of symptoms, the patient complains only of the following symptoms, and all other reviewed systems are negative.  ESS 2  ALLERGIES: Allergies  Allergen Reactions  . Sulfa Antibiotics Nausea Only and Rash  . Sulfamethoxazole Nausea Only and Rash  . Sulfasalazine Nausea Only and Rash    HOME MEDICATIONS: Outpatient Medications Prior to Visit  Medication Sig Dispense Refill  . acetaminophen (TYLENOL) 500 MG tablet Take 1,000 mg by mouth every 6 (six) hours as needed for mild pain or moderate pain.    Marland Kitchen aspirin 81 MG tablet Take 81 mg by mouth daily.    . Calcium Carbonate-Vitamin D (CALTRATE  600+D PO) Take 1 tablet by mouth daily.    Marland Kitchen COENZYME Q-10 PO Take 1 tablet by mouth daily.    Marland Kitchen desmopressin (DDAVP) 0.2 MG tablet Take 0.4 mg by mouth at bedtime.   0  . donepezil (ARICEPT) 5 MG tablet TAKE 1 TABLET BY MOUTH EVERYDAY AT BEDTIME 90 tablet 1  . FERREX 150 150 MG capsule Take 150 mg by mouth daily.    Marland Kitchen gabapentin (NEURONTIN) 100 MG capsule TAKE 1 CAPSULE BY MOUTH EVERYDAY AT BEDTIME  1  . meclizine (ANTIVERT) 25 MG tablet Take 25 mg by mouth daily.    . memantine (NAMENDA) 5 MG tablet Take 1 tablet by mouth 2 (two) times daily.    . Multiple Vitamins-Minerals (CENTRUM SILVER PO) Take 1 tablet by mouth daily.    . Omega-3 Fatty Acids (FISH OIL) 1000 MG CAPS Take 1 capsule (1,000 mg total) by mouth daily.  0  . Polyethyl Glycol-Propyl Glycol (SYSTANE OP) Apply 2 drops to eye daily as needed (Dry eyes). 2 drops each eye    . Red Yeast Rice 600 MG CAPS Take 1 capsule (600 mg total) by mouth daily. 90 capsule 2  . rOPINIRole (REQUIP) 1 MG tablet Take 2 tablets by mouth at bedtime.      No facility-administered medications prior to visit.  PAST MEDICAL HISTORY: Past Medical History:  Diagnosis Date  . Acid reflux    a. well-controlled with ranitidine.  Marland Kitchen BPH (benign prostatic hyperplasia)    a. s/p TURP 2012.  . Essential hypertension 09/10/2013  . Eye problem    a. L eye blurred vision - seeing eye doctor for tissue pulling away from eye.  . Hard of hearing   . Hiatal hernia    a. Small-mod by CXR 2015.  Marland Kitchen High cholesterol     PAST SURGICAL HISTORY: Past Surgical History:  Procedure Laterality Date  . PARS PLANA VITRECTOMY Left 2015   WITH MEMBRANE PEEL, 23 GAUGE  . TRANSURETHRAL RESECTION OF PROSTATE    . Urethral stricture surgery      FAMILY HISTORY: Family History  Problem Relation Age of Onset  . Other Mother        Died at 36 - "old age"  . Stroke Father        Died at 87  . Tuberculosis Paternal Grandfather   . Dementia Brother   . Stroke Brother    . CAD Neg Hx     SOCIAL HISTORY: Social History   Socioeconomic History  . Marital status: Married    Spouse name: June  . Number of children: Not on file  . Years of education: Not on file  . Highest education level: Not on file  Occupational History  . Not on file  Tobacco Use  . Smoking status: Never Smoker  . Smokeless tobacco: Never Used  Vaping Use  . Vaping Use: Never used  Substance and Sexual Activity  . Alcohol use: Yes    Comment: Once per month  . Drug use: No  . Sexual activity: Not on file  Other Topics Concern  . Not on file  Social History Narrative  . Not on file   Social Determinants of Health   Financial Resource Strain:   . Difficulty of Paying Living Expenses: Not on file  Food Insecurity:   . Worried About Charity fundraiser in the Last Year: Not on file  . Ran Out of Food in the Last Year: Not on file  Transportation Needs:   . Lack of Transportation (Medical): Not on file  . Lack of Transportation (Non-Medical): Not on file  Physical Activity:   . Days of Exercise per Week: Not on file  . Minutes of Exercise per Session: Not on file  Stress:   . Feeling of Stress : Not on file  Social Connections:   . Frequency of Communication with Friends and Family: Not on file  . Frequency of Social Gatherings with Friends and Family: Not on file  . Attends Religious Services: Not on file  . Active Member of Clubs or Organizations: Not on file  . Attends Archivist Meetings: Not on file  . Marital Status: Not on file  Intimate Partner Violence:   . Fear of Current or Ex-Partner: Not on file  . Emotionally Abused: Not on file  . Physically Abused: Not on file  . Sexually Abused: Not on file      PHYSICAL EXAM  Vitals:   07/06/20 1019  BP: 116/70  Pulse: 70  Weight: 200 lb 12.8 oz (91.1 kg)  Height: 5\' 10"  (1.778 m)   Body mass index is 28.81 kg/m.  Generalized: Well developed, in no acute distress  Chest: Lungs clear to  auscultation bilaterally  Neurological examination  Mentation: Alert oriented to time, place, history taking. Follows  all commands speech and language fluent Cranial nerve II-XII: Extraocular movements were full, visual field were full on confrontational test Head turning and shoulder shrug  were normal and symmetric. Motor: The motor testing reveals 5 over 5 strength of all 4 extremities. Good symmetric motor tone is noted throughout.  Sensory: Sensory testing is intact to soft touch on all 4 extremities. No evidence of extinction is noted.  Gait and station: Gait is normal.    DIAGNOSTIC DATA (LABS, IMAGING, TESTING) - I reviewed patient records, labs, notes, testing and imaging myself where available.  Lab Results  Component Value Date   WBC 6.0 10/31/2018   HGB 13.6 10/31/2018   HCT 39.6 10/31/2018   MCV 92 10/31/2018   PLT 238 10/31/2018      Component Value Date/Time   NA 138 10/31/2018 0950   K 4.2 10/31/2018 0950   CL 105 10/31/2018 0950   CO2 27 10/31/2018 0950   GLUCOSE 108 (H) 10/31/2018 0950   GLUCOSE 106 (H) 01/20/2015 0930   BUN 16 10/31/2018 0950   CREATININE 1.01 10/31/2018 0950   CALCIUM 8.8 10/31/2018 0950   PROT 6.1 (L) 01/20/2015 0930   PROT 6.0 02/10/2014 0956   ALBUMIN 3.9 01/20/2015 0930   ALBUMIN 4.5 02/10/2014 0956   AST 28 01/20/2015 0930   ALT 24 01/20/2015 0930   ALKPHOS 48 01/20/2015 0930   BILITOT 0.9 01/20/2015 0930   GFRNONAA 71 10/31/2018 0950   GFRAA 82 10/31/2018 0950   Lab Results  Component Value Date   CHOL 203 (H) 09/25/2019   HDL 59 09/25/2019   LDLCALC 126 (H) 09/25/2019   TRIG 100 09/25/2019   CHOLHDL 3.4 09/25/2019   No results found for: HGBA1C No results found for: VITAMINB12 No results found for: TSH    ASSESSMENT AND PLAN 79 y.o. year old male  has a past medical history of Acid reflux, BPH (benign prostatic hyperplasia), Essential hypertension (09/10/2013), Eye problem, Hard of hearing, Hiatal hernia, and High  cholesterol. here with:  1. OSA on CPAP  - CPAP compliance excellent -Residual AHI remains elevated with the majority of his events being central.  I will order a CPAP titration with possible transition to BiPAP.-Discussed with Dr. Brett Fairy and she is amenable to this plan -Patient will follow up after this study   I spent 30 minutes of face-to-face and non-face-to-face time with patient.  This included previsit chart review, lab review, study review, order entry, electronic health record documentation, patient education.  Ward Givens, MSN, NP-C 07/05/2020, 4:51 PM [facility] [address], [place], [place] [address] [phone]

## 2020-07-06 ENCOUNTER — Encounter: Payer: Self-pay | Admitting: Adult Health

## 2020-07-06 ENCOUNTER — Ambulatory Visit (INDEPENDENT_AMBULATORY_CARE_PROVIDER_SITE_OTHER): Payer: Medicare Other | Admitting: Adult Health

## 2020-07-06 ENCOUNTER — Telehealth: Payer: Self-pay | Admitting: Adult Health

## 2020-07-06 VITALS — BP 116/70 | HR 70 | Ht 70.0 in | Wt 200.8 lb

## 2020-07-06 DIAGNOSIS — I2510 Atherosclerotic heart disease of native coronary artery without angina pectoris: Secondary | ICD-10-CM

## 2020-07-06 DIAGNOSIS — Z9989 Dependence on other enabling machines and devices: Secondary | ICD-10-CM

## 2020-07-06 DIAGNOSIS — G4733 Obstructive sleep apnea (adult) (pediatric): Secondary | ICD-10-CM

## 2020-07-06 NOTE — Patient Instructions (Signed)
Continue using CPAP nightly and greater than 4 hours each night °If your symptoms worsen or you develop new symptoms please let us know.  ° °

## 2020-07-06 NOTE — Telephone Encounter (Signed)
Received message from [facility] that she had discussed the Bipap titration with Dr. Brett Fairy today and she is in agreement.   Called and spoke with patient's wife June. She verbalized understanding and will let the patient know.   Nothing further needed at time of call.

## 2020-07-20 ENCOUNTER — Encounter: Payer: Self-pay | Admitting: Orthopaedic Surgery

## 2020-07-20 ENCOUNTER — Ambulatory Visit (INDEPENDENT_AMBULATORY_CARE_PROVIDER_SITE_OTHER): Payer: Medicare Other

## 2020-07-20 ENCOUNTER — Ambulatory Visit (INDEPENDENT_AMBULATORY_CARE_PROVIDER_SITE_OTHER): Payer: Medicare Other | Admitting: Orthopaedic Surgery

## 2020-07-20 VITALS — Ht 70.0 in | Wt 199.0 lb

## 2020-07-20 DIAGNOSIS — M25561 Pain in right knee: Secondary | ICD-10-CM

## 2020-07-20 DIAGNOSIS — G8929 Other chronic pain: Secondary | ICD-10-CM

## 2020-07-20 DIAGNOSIS — M25461 Effusion, right knee: Secondary | ICD-10-CM | POA: Diagnosis not present

## 2020-07-20 DIAGNOSIS — M25551 Pain in right hip: Secondary | ICD-10-CM | POA: Diagnosis not present

## 2020-07-20 DIAGNOSIS — M7061 Trochanteric bursitis, right hip: Secondary | ICD-10-CM

## 2020-07-20 DIAGNOSIS — I2510 Atherosclerotic heart disease of native coronary artery without angina pectoris: Secondary | ICD-10-CM

## 2020-07-20 MED ORDER — LIDOCAINE HCL 1 % IJ SOLN
3.0000 mL | INTRAMUSCULAR | Status: AC | PRN
  Administered 2020-07-20: 3 mL

## 2020-07-20 MED ORDER — METHYLPREDNISOLONE ACETATE 40 MG/ML IJ SUSP
40.0000 mg | INTRAMUSCULAR | Status: AC | PRN
  Administered 2020-07-20: 40 mg via INTRA_ARTICULAR

## 2020-07-20 NOTE — Progress Notes (Signed)
Office Visit Note   Patient: [name]           Date of Birth: 26-Dec-1940           MRN: [id number] Visit Date: 07/20/2020              Requested by: Leanna Battles, MD [place],  [place] [address] PCP: Leanna Battles, MD   Assessment & Plan: Visit Diagnoses:  1. Chronic pain of right knee   2. Pain in right hip   3. Trochanteric bursitis, right hip   4. Effusion, right knee     Plan: I was able to aspirate 50 cc of fluid off of the patient's right knee that gave him immediate relief.  This was more consistent with arthritis type of fluid.  I did place a steroid injection in his right knee and in the right hip trochanteric area.  He felt much better after that.  I would like to see him back in 4 weeks to see how he is doing overall.  All questions and concerns were answered and addressed.  Follow-Up Instructions: Return in about 4 weeks (around 08/17/2020).   Orders:  Orders Placed This Encounter  Procedures  . Large Joint Inj  . Large Joint Inj  . XR HIP UNILAT W OR W/O PELVIS 1V RIGHT  . XR Knee 1-2 Views Right   No orders of the defined types were placed in this encounter.     Procedures: Large Joint Inj: R knee on 07/20/2020 3:07 PM Indications: diagnostic evaluation and pain Details: 22 G 1.5 in needle, superolateral approach  Arthrogram: No  Medications: 3 mL lidocaine 1 %; 40 mg methylPREDNISolone acetate 40 MG/ML Outcome: tolerated well, no immediate complications Procedure, treatment alternatives, risks and benefits explained, specific risks discussed. Consent was given by the patient. Immediately prior to procedure a time out was called to verify the correct patient, procedure, equipment, support staff and site/side marked as required. Patient was prepped and draped in the usual sterile fashion.   Large Joint Inj: R hip joint on 07/20/2020 3:07 PM Indications: pain and diagnostic evaluation Details: 22 G 1.5 in needle, lateral  approach  Arthrogram: No  Medications: 3 mL lidocaine 1 %; 40 mg methylPREDNISolone acetate 40 MG/ML Outcome: tolerated well, no immediate complications Procedure, treatment alternatives, risks and benefits explained, specific risks discussed. Consent was given by the patient. Immediately prior to procedure a time out was called to verify the correct patient, procedure, equipment, support staff and site/side marked as required. Patient was prepped and draped in the usual sterile fashion.       Clinical Data: No additional findings.   Subjective: Chief Complaint  Patient presents with  . Right Leg - Pain  The patient comes in today with chief chief complaints.  He has right hip pain over the trochanteric area and right knee pain medially.  He said that his right knee has been hurting for about 6 months now.  This started affecting his walking and now has been told he has hip bursitis.  The knee has been swollen.  He is never had any surgery on the knee and has never had any aspirations or injections in the knee or the hip area.  He is not a diabetic.  He is an active 79 year old gentleman.  He denies any other significant medical problems.  I did review his medical history and notes in epic on the patient. HPI  Review of Systems He currently denies  any headache, chest pain, shortness of breath, fever, chills, nausea, vomiting  Objective: Vital Signs: Ht 5\' 10"  (1.778 m)   Wt 199 lb (90.3 kg)   BMI 28.55 kg/m   Physical Exam He is alert and orient x3 and in no acute distress Ortho Exam Examination of his right knee does show a moderate knee joint effusion.  There is also medial joint line tenderness and some patellofemoral crepitation.  His knee is ligamentously stable with good range of motion.  His left knee is entirely normal.  His right hip moves smoothly and fluidly with pain only to palpation of the trochanteric area.  There is no groin pain and no blocks to  rotation. Specialty Comments:  No specialty comments available.  Imaging: XR HIP UNILAT W OR W/O PELVIS 1V RIGHT  Result Date: 07/20/2020 An AP pelvis and lateral of the right hip shows no acute findings with the hip joint itself.  The joint space is well-maintained.  XR Knee 1-2 Views Right  Result Date: 07/20/2020 2 views of the right knee show no acute findings.  The joint space is still well-maintained.    PMFS History: Patient Active Problem List   Diagnosis Date Noted  . Late onset Alzheimer's disease without behavioral disturbance ([place]) 09/18/2019  . Tachycardia-bradycardia syndrome ([place]) 09/09/2019  . Obstructive sleep apnea treated with continuous positive airway pressure (CPAP) 02/08/2018  . Memory loss, short term 11/23/2017  . Bilateral posterior capsular opacification 11/05/2015  . Pseudophakia, both eyes 11/05/2015  . Pseudophakia of left eye 04/24/2015  . Nuclear sclerosis of both eyes 11/05/2014  . Arteriosclerosis of coronary artery 12/31/2013  . Acid reflux 12/31/2013  . Hypercholesteremia 12/31/2013  . OSA (obstructive sleep apnea) 12/31/2013  . Head revolving around 12/31/2013  . History of BPH 12/31/2013  . GERD (gastroesophageal reflux disease) 12/31/2013  . Vertigo 12/31/2013  . CAD (coronary artery disease), native coronary artery 09/10/2013  . Hyperlipidemia 09/10/2013  . Essential hypertension 09/10/2013  . Central retinal edema, cystoid 08/26/2013  . Cystoid macular edema 08/26/2013  . Binocular vision disorder with diplopia 01/20/2012  . Diplopia 01/20/2012  . Cataract 12/09/2011  . Cellophane retinopathy 12/09/2011  . Divergent squint 12/09/2011  . Epiretinal membrane 12/09/2011  . Exotropia 12/09/2011   Past Medical History:  Diagnosis Date  . Acid reflux    a. well-controlled with ranitidine.  Marland Kitchen BPH (benign prostatic hyperplasia)    a. s/p TURP 2012.  . Essential hypertension 09/10/2013  . Eye problem    a. L eye blurred vision -  seeing eye doctor for tissue pulling away from eye.  . Hard of hearing   . Hiatal hernia    a. Small-mod by CXR 2015.  Marland Kitchen High cholesterol     Family History  Problem Relation Age of Onset  . Other Mother        Died at 2 - "old age"  . Stroke Father        Died at 101  . Tuberculosis Paternal Grandfather   . Dementia Brother   . Stroke Brother   . CAD Neg Hx     Past Surgical History:  Procedure Laterality Date  . PARS PLANA VITRECTOMY Left 2015   WITH MEMBRANE PEEL, 23 GAUGE  . TRANSURETHRAL RESECTION OF PROSTATE    . Urethral stricture surgery     Social History   Occupational History  . Not on file  Tobacco Use  . Smoking status: Never Smoker  . Smokeless tobacco: Never Used  Vaping Use  . Vaping Use: Never used  Substance and Sexual Activity  . Alcohol use: Yes    Comment: Once per month  . Drug use: No  . Sexual activity: Not on file

## 2020-07-24 ENCOUNTER — Other Ambulatory Visit: Payer: Self-pay

## 2020-07-24 ENCOUNTER — Ambulatory Visit (INDEPENDENT_AMBULATORY_CARE_PROVIDER_SITE_OTHER): Payer: Medicare Other | Admitting: Neurology

## 2020-07-24 DIAGNOSIS — R351 Nocturia: Secondary | ICD-10-CM

## 2020-07-24 DIAGNOSIS — F0280 Dementia in other diseases classified elsewhere without behavioral disturbance: Secondary | ICD-10-CM

## 2020-07-24 DIAGNOSIS — G4761 Periodic limb movement disorder: Secondary | ICD-10-CM

## 2020-07-24 DIAGNOSIS — G4733 Obstructive sleep apnea (adult) (pediatric): Secondary | ICD-10-CM | POA: Diagnosis not present

## 2020-07-24 DIAGNOSIS — G4739 Other sleep apnea: Secondary | ICD-10-CM

## 2020-07-24 DIAGNOSIS — I2510 Atherosclerotic heart disease of native coronary artery without angina pectoris: Secondary | ICD-10-CM

## 2020-07-24 DIAGNOSIS — G301 Alzheimer's disease with late onset: Secondary | ICD-10-CM

## 2020-08-01 ENCOUNTER — Telehealth: Payer: Self-pay | Admitting: Neurology

## 2020-08-01 DIAGNOSIS — I2510 Atherosclerotic heart disease of native coronary artery without angina pectoris: Secondary | ICD-10-CM | POA: Insufficient documentation

## 2020-08-01 DIAGNOSIS — G4761 Periodic limb movement disorder: Secondary | ICD-10-CM | POA: Insufficient documentation

## 2020-08-01 DIAGNOSIS — I25119 Atherosclerotic heart disease of native coronary artery with unspecified angina pectoris: Secondary | ICD-10-CM

## 2020-08-01 DIAGNOSIS — G4739 Other sleep apnea: Secondary | ICD-10-CM | POA: Insufficient documentation

## 2020-08-01 DIAGNOSIS — H35353 Cystoid macular degeneration, bilateral: Secondary | ICD-10-CM

## 2020-08-01 DIAGNOSIS — F0280 Dementia in other diseases classified elsewhere without behavioral disturbance: Secondary | ICD-10-CM

## 2020-08-01 DIAGNOSIS — R351 Nocturia: Secondary | ICD-10-CM | POA: Insufficient documentation

## 2020-08-01 NOTE — Procedures (Signed)
PATIENT'S NAME:  [name], [name] DOB:      04/16/1941      MR#:    [id number]     DATE OF RECORDING: 07/24/2020 REFERRING M.D.:  Ward Givens, NP Cc Dr Leanna Battles and Dr Crissie Sickles  Study Performed:   Titration to BiPAP and ASV HISTORY:  MM: 07-05-2020 last visit for Mr. [name], an 79 year old male patient with a history of sleep apnea on auto-CPAP.  His download indicates a compliance of 100%.  But his residual AHI is 9.9 on 5 to 15 cm of water with EPR 3.  At the last visit we increased his pressure from 5-10 but his apnea has worsened.  According to his download the majority of his events are central. We needed now to consider BiPAP therapy. The patient wears a nasal cradle interface at home, and he reports having frequent nocturia. Patient on medication for memory support (Dementia). No report of spinal stenosis, neuropathy or RLS in referral visit.    The patient endorsed the Epworth Sleepiness Scale at 2/ 24 points and had been at 11 points pre-CPAP.  The patient's weight 200 pounds with a height of 70 (inches), resulting in a BMI of 28.7 kg/m2. The patient's neck circumference measured 16 inches.  CURRENT MEDICATIONS: Acetaminophen, Aspirin, Calcium Vitamin-D, Desmopressin, Gabapentin, Meclizine, Memantine and Donezepil, Multi-Vitamin, Omega-3, Requip.  PROCEDURE:  This is a multichannel digital polysomnogram utilizing the SomnoStar 11.2 system.  Electrodes and sensors were applied and monitored per AASM Specifications.   EEG, EOG, Chin and Limb EMG, were sampled at 200 Hz.  ECG, Snore and Nasal Pressure, Thermal Airflow, Respiratory Effort, CPAP Flow and Pressure, Oximetry was sampled at 50 Hz. Digital video and audio were recorded.      CPAP was initiated with the mask type he uses at home, beginning at 8 cmH20 with heated humidity per AASM standards and pressure was advanced to 12 cmH20 but caused many central apneas, the AHI was 45/h. there were also severe PLMs noted. Due to air  leakage, he was switched to [place] (ResMed F30 medium), and to BIPAP 13/9 through 16/11 cm water with an insignificant reduction in AHI.  BiPAP with added ST function seemed to worsen the arousal frequency. The patient was finally deemed intolerant of CPAP, BiPAP and BiPAP ST and started on ASV- which immediately controlled central hypopneas, apneas and desaturations.  At an ASV pressure of max pressure support of 13 cmH20, minimum of 4 cm water and 12 cm EEP- there was a reduction of the AHI to 0.4/h with improvement of central dominant sleep apnea. PLMs persisted.  Lights Out was at 21:19 and Lights On at 04:32. Total recording time (TRT) was 434 minutes, with a total sleep time (TST) of 336.5 minutes. The patient's sleep latency was 3 minutes. REM latency was 11.5 minutes.  The sleep efficiency was 77.5 %.    SLEEP ARCHITECTURE: WASO (Wake after sleep onset) was 92.5 minutes.  There were 36 minutes in Stage N1, 180 minutes Stage N2, 37 minutes Stage N3 and 83.5 minutes in Stage REM.  The percentage of Stage N1 was 10.7%, Stage N2 was 53.5%, Stage N3 was 11.% and Stage R (REM sleep) was 24.8%. The sleep architecture was notable for excessive fragmentation.  RESPIRATORY ANALYSIS:  There was a total of 105 respiratory events: A total of 77 all central apneas and 28 central hypopneas. The patient also had 7 respiratory event related arousals (RERAs).     The total APNEA/HYPOPNEA INDEX (AHI)  was 18.7 /hour and the total RESPIRATORY DISTURBANCE INDEX was 20.0 /hour.  6 events occurred in REM sleep and 99 events in NREM.  The REM AHI was 4.3 /hour versus a non-REM AHI of 23.5 /hour.  The patient spent 223 minutes of total sleep time in the supine position and 114 minutes in non-supine.  The supine AHI was 19.6, versus a non-supine AHI of 16.9.  OXYGEN SATURATION & C02:  The baseline 02 saturation was 96%, with the lowest being 81%. Time spent below 89% saturation equaled 10 minutes.  The arousals were noted  as: 25 were spontaneous, 73 were associated with PLMs, 51 were associated with respiratory events. The patient had a total of 811 Periodic Limb Movements. The Periodic Limb Movement Arousal index was 13.0 /hour. Audio and video analysis did not show any behaviors, phonations or vocalizations. The frequent PLMs were clearly interrupting his sleep.   Minimal and soft Snoring was noted. Three bathroom breaks were counted, indicating a high nocturia frequency. EKG was in keeping with bradycardic sinus rhythm (SR) and with some PVCs.  DIAGNOSIS 1. Central Sleep Apnea improved under ASV after failing to respond to CPAP, BiPAP and BiPAP ST.  2. Sleep Related Hypoxemia was clinically not relevant at a Total Desaturation Time of only 10 minutes with a nadir at 81%. 3. Severe Periodic Limb Movement Disorder was present- the main cause of sleep fragmentation.   4. Nocturia times 3 in a patient on Desmopressin.   PLANS/RECOMMENDATIONS: 1. The patient was fitted with a FFM (ResMed F30 medium) and used ASV at a max. pressure support of 13 cm water, minimum pressure support of 4 cm water and 10 cm EEP- with good results, for a total sleep time of 149 minutes. These are the DME device orders.  2. The PLM disorder has to be addressed in more detail- cause and therapy to be determinated. He is currently on Ropinirole 2 mg and Gabapentin 100 mg at bedtime. 3. On Desmopressin with nocturia - diabetes insipidus?    DISCUSSION: ASV was able to address the central apnea component of this multifactorial sleep disorder but did not gain any PLM control, nor nocturia control.     A follow up appointment will be scheduled in the Sleep Clinic at [facility].   Please call [phone] with any questions.     I certify that I have reviewed the entire raw data recording prior to the issuance of this report in accordance with the Standards of Accreditation of the American Academy of Sleep Medicine  (AASM)   Larey Seat, M.D. Diplomat, Tax adviser of Psychiatry and Neurology  Diplomat, Tax adviser of Sleep Medicine Market researcher, [employer] [facility] at [employer]

## 2020-08-01 NOTE — Telephone Encounter (Signed)
PATIENT'S NAME:  [name], [name] DOB:      1941/02/11      MR#:    [id number]     DATE OF RECORDING: 07/24/2020 REFERRING M.D.:  Ward Givens, NP Cc Dr Leanna Battles and Dr Crissie Sickles   Study Performed:   Titration to BiPAP and ASV  Central Sleep Apnea improved under ASV after failing to respond to CPAP, BiPAP and BiPAP ST.  1. Sleep Related Hypoxemia was clinically not relevant at a Total Desaturation Time of only 10 minutes with a nadir at 81%. 2. Severe Periodic Limb Movement Disorder was present- the main cause of sleep fragmentation.   3. Nocturia times 3 in a patient on Desmopressin.   PLANS/RECOMMENDATIONS: 1. The patient was fitted with a FFM (ResMed F30 medium) and used ASV at a max. pressure support of 13 cm water, minimum pressure support of 4 cm water and 10 cm EEP- with good results, for a total sleep time of 149 minutes. These are the DME device orders.  2. The PLM disorder has to be addressed in more detail- cause and therapy to be determinated. He is currently on Ropinirole 2 mg and Gabapentin 100 mg at bedtime. 3. On Desmopressin with nocturia - diabetes insipidus?   Larey Seat, MD

## 2020-08-03 NOTE — Progress Notes (Signed)
DIAGNOSIS  1. Central Sleep Apnea improved under ASV after failing to  respond to CPAP, BiPAP and BiPAP ST.  2. Sleep Related Hypoxemia was clinically not relevant at a Total  Desaturation Time of only 10 minutes with a nadir at 81%.  3. Severe Periodic Limb Movement Disorder was present- the main  cause of sleep fragmentation.   4. Nocturia times 3 in a patient on Desmopressin.   PLANS/RECOMMENDATIONS:  1. The patient was fitted with a FFM (ResMed F30 medium) and used  ASV at a max. pressure support of 13 cm water, minimum pressure  support of 4 cm water and 10 cm EEP- with good results, for a  total sleep time of 149 minutes. These are the DME device orders.   2. The PLM disorder has to be addressed in more detail- cause and  therapy to be determinated. He is currently on Ropinirole 2 mg  and Gabapentin 100 mg at bedtime.  3. On Desmopressin with nocturia - diabetes insipidus?   DISCUSSION: ASV was able to address the central apnea component  of this multifactorial sleep disorder but did not gain any PLM  control, nor nocturia control.

## 2020-08-03 NOTE — Addendum Note (Signed)
Addended by: Larey Seat on: 08/03/2020 01:21 PM   Modules accepted: Orders

## 2020-08-04 ENCOUNTER — Telehealth: Payer: Self-pay | Admitting: Neurology

## 2020-08-04 NOTE — Telephone Encounter (Signed)
I called pt. I advised pt that Dr. Brett Fairy reviewed their sleep study results and found that pt was best tolerated on a ASV machine. Dr. Brett Fairy recommends that pt starts ASV. I reviewed PAP compliance expectations with the pt. Pt is agreeable to starting a ASV. I advised pt that an order will be sent to a DME, [facility] and [facility] ([place]) will call the pt within about one week after they file with the pt's insurance. [facility]) will show the pt how to use the machine, fit for masks, and troubleshoot the ASV if needed. A follow up appt will need to be made for insurance purposes with Dr. Brett Fairy on March 22,2021 at 1:30 pm. Pt verbalized understanding to arrive 15 minutes early and bring their ASV. A letter with all of this information in it will be mailed to the pt as a reminder. I verified with the pt that the address we have on file is correct. Pt verbalized understanding of results. Pt had no questions at this time but was encouraged to call back if questions arise. I have sent the order to [facility])  and have received confirmation that they have received the order.

## 2020-08-04 NOTE — Telephone Encounter (Signed)
-----   Message from Larey Seat, MD sent at 08/03/2020  1:21 PM EST ----- DIAGNOSIS  1. Central Sleep Apnea improved under ASV after failing to  respond to CPAP, BiPAP and BiPAP ST.  2. Sleep Related Hypoxemia was clinically not relevant at a Total  Desaturation Time of only 10 minutes with a nadir at 81%.  3. Severe Periodic Limb Movement Disorder was present- the main  cause of sleep fragmentation.   4. Nocturia times 3 in a patient on Desmopressin.   PLANS/RECOMMENDATIONS:  1. The patient was fitted with a FFM (ResMed F30 medium) and used  ASV at a max. pressure support of 13 cm water, minimum pressure  support of 4 cm water and 10 cm EEP- with good results, for a  total sleep time of 149 minutes. These are the DME device orders.   2. The PLM disorder has to be addressed in more detail- cause and  therapy to be determinated. He is currently on Ropinirole 2 mg  and Gabapentin 100 mg at bedtime.  3. On Desmopressin with nocturia - diabetes insipidus?   DISCUSSION: ASV was able to address the central apnea component  of this multifactorial sleep disorder but did not gain any PLM  control, nor nocturia control.

## 2020-08-17 ENCOUNTER — Encounter: Payer: Self-pay | Admitting: Orthopaedic Surgery

## 2020-08-17 ENCOUNTER — Ambulatory Visit (INDEPENDENT_AMBULATORY_CARE_PROVIDER_SITE_OTHER): Payer: Medicare Other | Admitting: Orthopaedic Surgery

## 2020-08-17 DIAGNOSIS — G8929 Other chronic pain: Secondary | ICD-10-CM

## 2020-08-17 DIAGNOSIS — M25551 Pain in right hip: Secondary | ICD-10-CM | POA: Diagnosis not present

## 2020-08-17 DIAGNOSIS — M25561 Pain in right knee: Secondary | ICD-10-CM

## 2020-08-17 DIAGNOSIS — I2510 Atherosclerotic heart disease of native coronary artery without angina pectoris: Secondary | ICD-10-CM | POA: Diagnosis not present

## 2020-08-17 NOTE — Progress Notes (Signed)
The patient is an 79 year old gentleman that I saw about a month ago and aspirated fluid off of his right knee which was consistent with arthritis.  His plain films still show well-maintained joint space.  I also injected his right hip trochanteric area.  He reports that he is doing much better overall.  Examination his right hip still shows some pain of the trochanteric area but he has normal motion of that hip and normal hip exam otherwise.  His right knee still actually does show mild effusion and I was able to aspirate about 30 cc of fluid off the knee.  He still bending the knee much better and feels better overall.  Again his x-rays of the right knee still show well-maintained joint space.  From my standpoint I would just watch this for now.  He can always have a repeat steroid injection in late February if he needs it.  If he continues to have recurrent effusions we can certainly MRI of the knee.  All questions and concerns were answered and addressed.  Follow-up is as needed.  Have also recommended Voltaren gel for his hip and his knee.

## 2020-09-02 DIAGNOSIS — H903 Sensorineural hearing loss, bilateral: Secondary | ICD-10-CM | POA: Diagnosis not present

## 2020-10-19 ENCOUNTER — Other Ambulatory Visit: Payer: Self-pay | Admitting: Neurology

## 2020-10-29 DIAGNOSIS — D485 Neoplasm of uncertain behavior of skin: Secondary | ICD-10-CM | POA: Diagnosis not present

## 2020-10-29 DIAGNOSIS — L570 Actinic keratosis: Secondary | ICD-10-CM | POA: Diagnosis not present

## 2020-10-29 DIAGNOSIS — Z85828 Personal history of other malignant neoplasm of skin: Secondary | ICD-10-CM | POA: Diagnosis not present

## 2020-11-02 ENCOUNTER — Telehealth: Payer: Self-pay | Admitting: Neurology

## 2020-11-02 NOTE — Telephone Encounter (Signed)
LVM informing pt of appointment change from 03/22 to 05/16 due to being set up on sleep machine.

## 2020-11-17 ENCOUNTER — Ambulatory Visit: Payer: Self-pay | Admitting: Neurology

## 2020-11-17 ENCOUNTER — Ambulatory Visit (INDEPENDENT_AMBULATORY_CARE_PROVIDER_SITE_OTHER): Payer: Medicare Other | Admitting: Orthopaedic Surgery

## 2020-11-17 ENCOUNTER — Encounter: Payer: Self-pay | Admitting: Orthopaedic Surgery

## 2020-11-17 DIAGNOSIS — G8929 Other chronic pain: Secondary | ICD-10-CM | POA: Diagnosis not present

## 2020-11-17 DIAGNOSIS — M7061 Trochanteric bursitis, right hip: Secondary | ICD-10-CM

## 2020-11-17 DIAGNOSIS — M25561 Pain in right knee: Secondary | ICD-10-CM | POA: Diagnosis not present

## 2020-11-17 MED ORDER — LIDOCAINE HCL 1 % IJ SOLN
3.0000 mL | INTRAMUSCULAR | Status: AC | PRN
  Administered 2020-11-17: 3 mL

## 2020-11-17 MED ORDER — METHYLPREDNISOLONE ACETATE 40 MG/ML IJ SUSP
40.0000 mg | INTRAMUSCULAR | Status: AC | PRN
  Administered 2020-11-17: 40 mg via INTRA_ARTICULAR

## 2020-11-17 NOTE — Progress Notes (Signed)
Office Visit Note   Patient: [name]           Date of Birth: [birth year]           MRN: [id number] Visit Date: 11/17/2020              Requested by: Leanna Battles, MD [place],  [place] [id number] PCP: Leanna Battles, MD   Assessment & Plan: Visit Diagnoses:  1. Chronic pain of right knee   2. Trochanteric bursitis, right hip     Plan: Since it has been 4 months since I injected both these areas I was comfortable with injecting them again today.  He agreed to this and tolerated both the right hip trochanteric injection in the right knee steroid injection well.  Follow-up can be as needed.  He knows to wait at least 3 to 4 months between injections.  Follow-Up Instructions: Return if symptoms worsen or fail to improve.   Orders:  Orders Placed This Encounter  Procedures  . Large Joint Inj  . Large Joint Inj   No orders of the defined types were placed in this encounter.     Procedures: Large Joint Inj: R knee on 11/17/2020 10:22 AM Indications: diagnostic evaluation and pain Details: 22 G 1.5 in needle, superolateral approach  Arthrogram: No  Medications: 3 mL lidocaine 1 %; 40 mg methylPREDNISolone acetate 40 MG/ML Outcome: tolerated well, no immediate complications Procedure, treatment alternatives, risks and benefits explained, specific risks discussed. Consent was given by the patient. Immediately prior to procedure a time out was called to verify the correct patient, procedure, equipment, support staff and site/side marked as required. Patient was prepped and draped in the usual sterile fashion.   Large Joint Inj: R greater trochanter on 11/17/2020 10:23 AM Indications: pain and diagnostic evaluation Details: 22 G 1.5 in needle, lateral approach  Arthrogram: No  Medications: 3 mL lidocaine 1 %; 40 mg methylPREDNISolone acetate 40 MG/ML Outcome: tolerated well, no immediate complications Procedure, treatment alternatives, risks and benefits  explained, specific risks discussed. Consent was given by the patient. Immediately prior to procedure a time out was called to verify the correct patient, procedure, equipment, support staff and site/side marked as required. Patient was prepped and draped in the usual sterile fashion.       Clinical Data: No additional findings.   Subjective: Chief Complaint  Patient presents with  . Right Hip - Pain  . Right Knee - Pain  The patient a very pleasant 80 year old gentleman who have seen before.  4 months ago I did place a steroid injection in his right knee in his right hip trochanteric area.  This helped him quite a bit.  He has had some pain again over the right hip trochanteric area and the right knee and would like to have a steroid injection in both of these today.  He is an active individual with a slowly mobilize.  His wife is with him today.  He is not a diabetic.  He has not had any acute change in medical status.  He denies any locking catching with his right knee.  HPI  Review of Systems There is currently listed no headache, chest pain, shortness of breath, fever, chills, nausea, vomiting  Objective: Vital Signs: There were no vitals taken for this visit.  Physical Exam He is alert and oriented x3 and in no acute distress Ortho Exam Examination of his right hip shows pain over the trochanteric area only with smooth  and fluid range of motion of the hip itself and no pain in the groin.  Examination of the right knee shows some global tenderness but no effusion and good range of motion.  The right knee is ligamentously stable. Specialty Comments:  No specialty comments available.  Imaging: No results found.   PMFS History: Patient Active Problem List   Diagnosis Date Noted  . Treatment-emergent central sleep apnea 08/01/2020  . PLMD (periodic limb movement disorder) 08/01/2020  . Atherosclerosis of native coronary artery of native heart 08/01/2020  . Nocturia more  than twice per night 08/01/2020  . Alzheimer's type dementia with late onset without behavioral disturbance ([place]) 09/18/2019  . Tachycardia-bradycardia syndrome ([place]) 09/09/2019  . Obstructive sleep apnea treated with continuous positive airway pressure (CPAP) 02/08/2018  . Memory loss, short term 11/23/2017  . Bilateral posterior capsular opacification 11/05/2015  . Pseudophakia, both eyes 11/05/2015  . Pseudophakia of left eye 04/24/2015  . Nuclear sclerosis of both eyes 11/05/2014  . Arteriosclerosis of coronary artery 12/31/2013  . Acid reflux 12/31/2013  . Hypercholesteremia 12/31/2013  . OSA (obstructive sleep apnea) 12/31/2013  . Head revolving around 12/31/2013  . History of BPH 12/31/2013  . GERD (gastroesophageal reflux disease) 12/31/2013  . Vertigo 12/31/2013  . CAD (coronary artery disease), native coronary artery 09/10/2013  . Hyperlipidemia 09/10/2013  . Essential hypertension 09/10/2013  . Central retinal edema, cystoid 08/26/2013  . Cystoid macular edema 08/26/2013  . Binocular vision disorder with diplopia 01/20/2012  . Diplopia 01/20/2012  . Cataract 12/09/2011  . Cellophane retinopathy 12/09/2011  . Divergent squint 12/09/2011  . Epiretinal membrane 12/09/2011  . Exotropia 12/09/2011   Past Medical History:  Diagnosis Date  . Acid reflux    a. well-controlled with ranitidine.  Marland Kitchen BPH (benign prostatic hyperplasia)    a. s/p TURP 2012.  . Essential hypertension 09/10/2013  . Eye problem    a. L eye blurred vision - seeing eye doctor for tissue pulling away from eye.  . Hard of hearing   . Hiatal hernia    a. Small-mod by CXR 2015.  Marland Kitchen High cholesterol     Family History  Problem Relation Age of Onset  . Other Mother        Died at 65 - "old age"  . Stroke Father        Died at 27  . Tuberculosis Paternal Grandfather   . Dementia Brother   . Stroke Brother   . CAD Neg Hx     Past Surgical History:  Procedure Laterality Date  . PARS PLANA  VITRECTOMY Left 2015   WITH MEMBRANE PEEL, 23 GAUGE  . TRANSURETHRAL RESECTION OF PROSTATE    . Urethral stricture surgery     Social History   Occupational History  . Not on file  Tobacco Use  . Smoking status: Never Smoker  . Smokeless tobacco: Never Used  Vaping Use  . Vaping Use: Never used  Substance and Sexual Activity  . Alcohol use: Yes    Comment: Once per month  . Drug use: No  . Sexual activity: Not on file

## 2020-11-27 DIAGNOSIS — Z125 Encounter for screening for malignant neoplasm of prostate: Secondary | ICD-10-CM | POA: Diagnosis not present

## 2020-11-27 DIAGNOSIS — E785 Hyperlipidemia, unspecified: Secondary | ICD-10-CM | POA: Diagnosis not present

## 2020-12-02 DIAGNOSIS — R82998 Other abnormal findings in urine: Secondary | ICD-10-CM | POA: Diagnosis not present

## 2020-12-04 DIAGNOSIS — E785 Hyperlipidemia, unspecified: Secondary | ICD-10-CM | POA: Diagnosis not present

## 2020-12-04 DIAGNOSIS — Z1339 Encounter for screening examination for other mental health and behavioral disorders: Secondary | ICD-10-CM | POA: Diagnosis not present

## 2020-12-04 DIAGNOSIS — L989 Disorder of the skin and subcutaneous tissue, unspecified: Secondary | ICD-10-CM | POA: Diagnosis not present

## 2020-12-04 DIAGNOSIS — I10 Essential (primary) hypertension: Secondary | ICD-10-CM | POA: Diagnosis not present

## 2020-12-04 DIAGNOSIS — M25561 Pain in right knee: Secondary | ICD-10-CM | POA: Diagnosis not present

## 2020-12-04 DIAGNOSIS — M25551 Pain in right hip: Secondary | ICD-10-CM | POA: Diagnosis not present

## 2020-12-04 DIAGNOSIS — N400 Enlarged prostate without lower urinary tract symptoms: Secondary | ICD-10-CM | POA: Diagnosis not present

## 2020-12-04 DIAGNOSIS — G4733 Obstructive sleep apnea (adult) (pediatric): Secondary | ICD-10-CM | POA: Diagnosis not present

## 2020-12-04 DIAGNOSIS — Z1331 Encounter for screening for depression: Secondary | ICD-10-CM | POA: Diagnosis not present

## 2020-12-04 DIAGNOSIS — F0390 Unspecified dementia without behavioral disturbance: Secondary | ICD-10-CM | POA: Diagnosis not present

## 2020-12-04 DIAGNOSIS — K219 Gastro-esophageal reflux disease without esophagitis: Secondary | ICD-10-CM | POA: Diagnosis not present

## 2020-12-04 DIAGNOSIS — Z0000 Encounter for general adult medical examination without abnormal findings: Secondary | ICD-10-CM | POA: Diagnosis not present

## 2021-01-05 ENCOUNTER — Encounter: Payer: Self-pay | Admitting: Neurology

## 2021-01-11 ENCOUNTER — Ambulatory Visit (INDEPENDENT_AMBULATORY_CARE_PROVIDER_SITE_OTHER): Payer: Medicare Other | Admitting: Neurology

## 2021-01-11 ENCOUNTER — Encounter: Payer: Self-pay | Admitting: Neurology

## 2021-01-11 VITALS — BP 122/64 | HR 65 | Ht 70.0 in | Wt 202.0 lb

## 2021-01-11 DIAGNOSIS — G4731 Primary central sleep apnea: Secondary | ICD-10-CM | POA: Insufficient documentation

## 2021-01-11 DIAGNOSIS — F0280 Dementia in other diseases classified elsewhere without behavioral disturbance: Secondary | ICD-10-CM

## 2021-01-11 DIAGNOSIS — Z7189 Other specified counseling: Secondary | ICD-10-CM | POA: Diagnosis not present

## 2021-01-11 DIAGNOSIS — G301 Alzheimer's disease with late onset: Secondary | ICD-10-CM | POA: Diagnosis not present

## 2021-01-11 MED ORDER — MEMANTINE HCL 10 MG PO TABS: 10.0000 mg | ORAL_TABLET | Freq: Two times a day (BID) | ORAL | 11 refills | Status: DC

## 2021-01-11 NOTE — Progress Notes (Signed)
SLEEP MEDICINE CLINIC   Provider:  Larey Seat, M D  Primary Care Physician:  Donald Battles, MD   Referring Provider: Leanna Battles, MD   Chief Complaint  Patient presents with  . Follow-up    Pt with wife, rm 1. Presents today for assessment of initial ASV usage. Overall states things are stable and well. The wife mentioned the water chamber area seems to dry out quickly. DME. [facility] ([facility])     HPI:  [name] is a 80 y.o. male patient , seen here in a Revisit 01-12-2021:  The patient underwent at first a sleep study with an AHI of 9.9 using an auto CPAP at home.  He developed central apneas under the treatment and we did consider therefore a BiPAP therapy and asked him to return to the sleep lab.  It took place on 24 July 2020 and the patient did not tolerate CPAP very well and was switched to BiPAP and finally to a SV.  ASV immediately controlled central hypopneas, apneas and desaturations.  The pressure was a 13 cmH2O pressure support minimum of 4 and a maximum of 12 cm and there was a reduction of the AHI to 0.4.  He still has some periodic limb movements but he had clearly non-REM sleep dependent apnea rather than REM sleep dependent apnea.  He was fitted with a full facemask with a ResMed F 30 and medium he slept 149 minutes under the last setting and tolerated it very well. His download today shows the settings as quoted above and he now has a residual apnea index of 0.9/h he does have shallow breathing spells still at 6.7/h.  He does have a very high air leakage in spite of being just fitted for a new mask. The patient does have a mustache but it does not interfere with his full facemask.  We are here also to address memory changes.        CONSULT _2-19-2021;  I  have the pleasure of meeting [name] and his wife today, he is meanwhile 12 years old he has a history of obstructive sleep apnea treated with CPAP, but he also was diagnosed 2  years ago with cognitive impairment with memory loss, this kind of amnestic cognitive impairment has a 7% conversion rate to dementia per year.  Goal is to test him and serial intervals to make sure That we can start medication should this arise, and he had tried Vyagog- no success. MMSE has declined. Now defiantly dementia.   Original referral  from Dr. Philip Beasley for evaluation of sleep medicine needs.  The patient has been evaluated for OSA last in 2008 and was using a oxygen concentrator, originally seen by me in the same year he was issued a CPAP machine. He could not get comfortable with CPAP, had air leaks, and aerophagia. Oxygen was finally prescribed alone - a compromise to maintain SpO2 . During the last visit with his primary care physician, Dr. Donnajean Beasley, the patient had also been tested for short-term memory concerns.  Reported that some people he had known for many years he could no longer remember by the names.  He is also easily distracted, sometimes forgets what day of the week it is.  He also discussed via cog.  In an annual physical examination in August 2018 pravastatin was discontinued and he started DDAVP  to reduce nocturia and improve sleep quality.  October 2018 his urologist confirmed that she seemed to be  doing well.  He has also used gabapentin 100 mg at bedtime which has helped with sleep.  Cardiologist last visited in December 2018 felt he was stable with no changes to EKG.  Reviewed his medications today, 11-23-2017  I have the pleasure of seeing [name] today for a revisit on 23 November 2017.  He is here in the presence of his wife, the patient underwent a polysomnography study on 13 November 2017.  He was diagnosed with a rather mild apnea at an AHI of 14.7 which exacerbated in supine sleep position to an apnea hypopnea index of 16.1/h of sleep.  During REM sleep the AHI was also higher at 15.9/h.  Only 14 minutes of total desaturation time were seen indicating that  the patient does not need oxygen.  There were frequent periodic limb movements and arousals related to limb movements of 2.7 /h.  He assured me that he sleeps at night on his side, confirmed by his wife. He wants to keep his oxygen concentrator - likes it and feels it helps. He is due for a new certification in July stated his wife. He is very disappointed that he wouldn't qualify for a new concentrator.   Chief complaint according to patient :  "I have no sleep issues"   Sleep habits are as follows:  He drinks a glass of tea at dinner, sometimes cola. The patient reports that sometimes he will dose off before his wife. He goes to bed by 9.30 and reads in bed.  He is using an oxygen concentrator at 2 L with a nasal cannula.  His nocturia has been reduced to once at night after he started DDAVP.  His wife does not report that he snores, she has not witnessed any apneas.  He seems to sleep well and through the night until 6.30 when he rises, he wakes spontaneously. The couple exercises twice a week in AM with a trainer.  Drinks coffee in AM, feels refreshed most of the time, no dry mouth, dizziness, nausea, palpitations or headaches.  During the  daytime he  falls asleep easily and not physically active or mentally stimulated. He may stay asleep for about 30 minutes.   Sleep medical history and family sleep history:  An adult son suffered a stroke and is currently living with his family again, there are no other sleep disorders or history of sleep disorders known among the blood relations of [name]- history of tonsillectomy, neck surgery, traumatic brain injury in the patient. No sleep walking, enuresis or night terrors.  ,mother lived to be 33, father 72 or 4, no dementia history.   Social history: adult son suffered a stroke and now lives with parents, again. Another adult son lives in [place], 3 grandchildren. Non smoker. ETOH 2 times / month.   Review of Systems: Out of a complete 14 system  review, the patient complains of only the following symptoms, and all other reviewed systems are negative.  Patient is here mainly for a reevaluation if he has sleep apnea or not.  Dr. Philip Beasley also is concerned that possible low oxygen levels at night or an undetected sleep disorder may contribute to the patient's recent cognitive challenges.  He reports memory loss, some confusion, some anxiety, cough and hearing loss.  He is status post TURP transurethral resection of the prostate.    Social History   Socioeconomic History  . Marital status: Married    Spouse name: June  . Number of children: Not on  file  . Years of education: Not on file  . Highest education level: Not on file  Occupational History  . Not on file  Tobacco Use  . Smoking status: Never Smoker  . Smokeless tobacco: Never Used  Vaping Use  . Vaping Use: Never used  Substance and Sexual Activity  . Alcohol use: Yes    Comment: Once per month  . Drug use: No  . Sexual activity: Not on file  Other Topics Concern  . Not on file  Social History Narrative  . Not on file   Social Determinants of Health   Financial Resource Strain: Not on file  Food Insecurity: Not on file  Transportation Needs: Not on file  Physical Activity: Not on file  Stress: Not on file  Social Connections: Not on file  Intimate Partner Violence: Not on file    Family History  Problem Relation Age of Onset  . Other Mother        Died at 29 - "old age"  . Stroke Father        Died at 68  . Tuberculosis Paternal Grandfather   . Dementia Brother   . Stroke Brother   . CAD Neg Hx     Past Medical History:  Diagnosis Date  . Acid reflux    a. well-controlled with ranitidine.  Marland Kitchen BPH (benign prostatic hyperplasia)    a. s/p TURP 2012.  . Essential hypertension 09/10/2013  . Eye problem    a. L eye blurred vision - seeing eye doctor for tissue pulling away from eye.  . Hard of hearing   . Hiatal hernia    a. Small-mod by CXR 2015.   Marland Kitchen High cholesterol     Past Surgical History:  Procedure Laterality Date  . PARS PLANA VITRECTOMY Left 2015   WITH MEMBRANE PEEL, 23 GAUGE  . TRANSURETHRAL RESECTION OF PROSTATE    . Urethral stricture surgery      Current Outpatient Medications  Medication Sig Dispense Refill  . acetaminophen (TYLENOL) 500 MG tablet Take 1,000 mg by mouth every 6 (six) hours as needed for mild pain or moderate pain.    Marland Kitchen aspirin 81 MG tablet Take 81 mg by mouth daily.    . bifidobacterium infantis (ALIGN) capsule Take 1 capsule by mouth daily.    . Calcium Carbonate-Vitamin D (CALTRATE 600+D PO) Take 1 tablet by mouth daily.    Marland Kitchen COENZYME Q-10 PO Take 1 tablet by mouth daily.    Marland Kitchen desmopressin (DDAVP) 0.2 MG tablet Take 0.4 mg by mouth at bedtime.   0  . donepezil (ARICEPT) 5 MG tablet TAKE 1 TABLET BY MOUTH EVERYDAY AT BEDTIME 90 tablet 0  . FERREX 150 150 MG capsule Take 150 mg by mouth daily.    Marland Kitchen gabapentin (NEURONTIN) 100 MG capsule TAKE 1 CAPSULE BY MOUTH EVERYDAY AT BEDTIME  1  . meclizine (ANTIVERT) 25 MG tablet Take 25 mg by mouth daily.    . memantine (NAMENDA) 5 MG tablet Take 1 tablet by mouth 2 (two) times daily.    . Multiple Vitamins-Minerals (CENTRUM SILVER PO) Take 1 tablet by mouth daily.    . Omega-3 Fatty Acids (FISH OIL) 1000 MG CAPS Take 1 capsule (1,000 mg total) by mouth daily.  0  . Polyethyl Glycol-Propyl Glycol (SYSTANE OP) Apply 2 drops to eye daily as needed (Dry eyes). 2 drops each eye    . Red Yeast Rice 600 MG CAPS Take 1 capsule (600 mg total)  by mouth daily. 90 capsule 2  . rOPINIRole (REQUIP) 1 MG tablet Take 2 tablets by mouth at bedtime.     . rosuvastatin (CRESTOR) 20 MG tablet Take 20 mg by mouth daily.     No current facility-administered medications for this visit.    Allergies as of 01/11/2021 - Review Complete 01/11/2021  Allergen Reaction Noted  . Sulfa antibiotics Nausea Only and Rash 09/05/2013  . Sulfamethoxazole Nausea Only and Rash 12/09/2011   . Sulfasalazine Nausea Only and Rash 01/24/2014    Vitals: BP 122/64   Pulse 65   Ht 5\' 10"  (1.778 m)   Wt 202 lb (91.6 kg)   BMI 28.98 kg/m  Last Weight:  Wt Readings from Last 1 Encounters:  01/11/21 202 lb (91.6 kg)   [id number] mass index is 28.98 kg/m.     Last Height:   Ht Readings from Last 1 Encounters:  01/11/21 5\' 10"  (1.778 m)    Physical exam:  General: The patient is awake, alert and appears not in acute distress. The patient is well groomed. Head: Normocephalic, atraumatic. Neck is supple. Mallampati 2 with a tongue and uvula moving in midline, the uvula lifts above the tongue ground,  Hoarseness .  neck circumference: 15.5  Nasal airflow patent, Retrognathia is not seen.  Cardiovascular:  Regular rate and rhythm , without  murmurs or carotid bruit, and without distended neck veins. Respiratory: Lungs are clear to auscultation. Skin:  Without evidence of edema, or rash. Trunk: BMI is 28.98-  Gained weight.     Neurologic exam : The patient is awake and alert, but reports  being challenged " orientation  to place and time" .   MOCA:  MMSE: trending downwards.  MMSE - Mini Mental State Exam 01/11/2021 09/18/2019 11/23/2017 10/11/2017  Orientation to time 0 1 3 1   Orientation to Place 4 3 5 4   Registration 3 3 3 3   Attention/ Calculation 1 1 5 5   Attention/Calculation-comments - - couldnt do numbers couldnt do the numbers  Recall 0 0 1 0  Language- name 2 objects 2 2 2 2   Language- repeat 1 1 1 1   Language- follow 3 step command 2 3 3 3   Language- read & follow direction 1 1 1 1   Write a sentence 1 1 1 1   Copy design 0 1 0 0  Total score 15 17 25 21      Attention span & concentration ability appears limited. The patient looks towards his wife for answers.  Speech is fluent, without dysarthria, dysphonia or aphasia.  Mood and affect are a little agitated   Cranial nerves: Pupils are equal and briskly reactive to light. Funduscopic exam without evidence of  pallor or edema. Extraocular movements  in vertical and horizontal planes intact and without nystagmus. Visual fields by finger perimetry are intact. Hearing to finger rub intact- with hearing aids in place.  Facial sensation intact to fine touch. Facial motor strength is symmetric and tongue and uvula move midline. Shoulder shrug was symmetrical.   Motor exam:   Normal tone, muscle bulk and symmetric strength in all extremities. Sensory:  Fine touch, pinprick and vibration were normal. Coordination: Rapid alternating movements in the fingers/hands was normal.  Finger-to-nose maneuver  normal without evidence of ataxia, dysmetria or tremor. Gait and station: Patient walks without assistive device .Tandem gait is unfragmented. Turns with 3  Steps. Romberg testing is negative. Deep tendon reflexes: in the  upper and lower extremities are symmetric 1 plus.  Babinski maneuver deferred.   Assessment:  After physical and neurologic examination, review of laboratory studies,  Personal review of imaging studies, reports of other /same  Imaging studies, results of polysomnography and / or neurophysiology testing and pre-existing records as far as provided in visit., my assessment is   Dementia - followed by PCP.  tis MMSE was at 15 points, no longer MCI.     1) CSA:  [name] reports that he feels more sleepy now in daytime, easily dozes off when physically not active and not stimulated in other ways.   However he does not report sleep attacks, he does not feel that he is likely to fall asleep in a conversation or in a situation where it is crucial to be alert.  I do not have records available from 2008 or earlier unfortunately these have fallen victim to our computerization.  He is less sleepy than he was in 2021- and needs no additional oxygen.  He has central apneas now, in the context of a neuro-degenerative process. ASV worked well, but air leak needs to be addressed.  He sleeps on his sides and  dislodges the mask.    The patient was advised of the nature of the diagnosed disorder ( CSA and dementia )  , the treatment options and the  risks for general health and wellness arising from not treating the condition.   I spent more than 45 minutes of face to face time with the patient.  Greater than 50% of time was spent in counseling and coordination of care. We have discussed the diagnosis and differential and I answered the patient's questions.    Plan:  Treatment plan and additional workup :   Dr Arn Medal, etc.     Welcome Fults, MD [id number], [id number] PM  Certified in Neurology by ABPN Certified in Sleep Medicine by [facility] [address] [place], [place] [address]           SLEEP MEDICINE CLINIC   Provider:  Larey Seat, [place] D  Primary Care Physician:  Donald Battles, MD   Referring Provider: Leanna Battles, MD   Chief Complaint  Patient presents with  . Follow-up    Pt with wife, rm 49. Presents today for assessment of initial ASV usage. Overall states things are stable and well. The wife mentioned the water chamber area seems to dry out quickly. DME. [facility] ([facility])     HPI:  [name] is a 80 y.o. male , seen here  in a referral   from Dr. Philip Beasley for evaluation of sleep medicine needs.  The patient has been evaluated for OSA last in 2008 and was using a oxygen concentrator, originally seen by me in the same year he was issued a CPAP machine. He could not get comfortable with CPAP, had air leaks, and aerophagia. Oxygen was finally prescribed alone - a compromise to maintain SpO2 .  During the last visit with his primary care physician, Dr. Juliette Mangle, the patient had also been tested for short-term memory concerns.  Reported that some people he had known for many years he could no longer remember by the names.  He is also easily distracted, sometimes forgets what day of the week it is.  He  also discussed via cog.  In an annual physical examination in August 2018 pravastatin was discontinued and he started DDAVP  to reduce nocturia and improve sleep quality.  October 2018 his urologist  confirmed that she seemed to be doing well.  He has also used gabapentin 100 mg at bedtime which has helped with sleep.  Cardiologist last visited in December 2018 felt he was stable with no changes to EKG.  Chief complaint according to patient :  "I have no sleep issues"   Sleep habits are as follows:  He drinks a glass of tea at dinner, sometimes cola. The patient reports that sometimes he will dose off before his wife. He goes to bed by 9.30 and reads in bed.  He is using an oxygen concentrator at 2 L with a nasal cannula.  His nocturia has been reduced to once at night after he started DDAVP.  His wife does not report that he snores, she has not witnessed any apneas.  He seems to sleep well and through the night until 6.30 when he rises, he wakes spontaneously. The couple exercises twice a week in AM with a trainer.  Drinks coffee in AM, feels refreshed most of the time, no dry mouth, dizziness, nausea, palpitations or headaches.  During the  daytime he  falls asleep easily and not physically active or mentally stimulated. He may stay asleep for about 30 minutes.   Sleep medical history and family sleep history: An adult son suffered a stroke and is currently living with his family again, there are no other sleep disorders or history of sleep disorders known among the blood relations of Mr. Danzy.- history of tonsillectomy, neck surgery, traumatic brain injury in the patient. No sleep walking, enuresis or night terrors.    Social history: adult son suffered a stroke and now lives with parents, again. Another adult son lives in [place], 3 grandchildren. Non smoker. ETOH 2 times / month.   Review of Systems: Out of a complete 14 system review, the patient complains of only the following symptoms, and all  other reviewed systems are negative.  Dr. Sharlett Iles also is concerned that possible low oxygen levels at night or an undetected sleep disorder may contribute to the patient's recent cognitive challenges.   He reports memory loss, some confusion, some anxiety, cough and hearing loss.    He is status post TURP transurethral resection of the prostate.  Reviewed his medications.  Epworth score  5 from 16 pre ASV,  Fatigue severity score 25 , depression score 2 /15    Social History   Socioeconomic History  . Marital status: Married    Spouse name: June  . Number of children: Not on file  . Years of education: Not on file  . Highest education level: Not on file  Occupational History  . Not on file  Tobacco Use  . Smoking status: Never Smoker  . Smokeless tobacco: Never Used  Vaping Use  . Vaping Use: Never used  Substance and Sexual Activity  . Alcohol use: Yes    Comment: Once per month  . Drug use: No  . Sexual activity: Not on file  Other Topics Concern  . Not on file  Social History Narrative  . Not on file   Social Determinants of Health   Financial Resource Strain: Not on file  Food Insecurity: Not on file  Transportation Needs: Not on file  Physical Activity: Not on file  Stress: Not on file  Social Connections: Not on file  Intimate Partner Violence: Not on file    Family History  Problem Relation Age of Onset  . Other Mother        Died at  34 - "old age"  . Stroke Father        Died at 2  . Tuberculosis Paternal Grandfather   . Dementia Brother   . Stroke Brother   . CAD Neg Hx     Past Medical History:  Diagnosis Date  . Acid reflux    a. well-controlled with ranitidine.  Marland Kitchen BPH (benign prostatic hyperplasia)    a. s/p TURP 2012.  . Essential hypertension 09/10/2013  . Eye problem    a. L eye blurred vision - seeing eye doctor for tissue pulling away from eye.  . Hard of hearing   . Hiatal hernia    a. Small-mod by CXR 2015.  Marland Kitchen High  cholesterol     Past Surgical History:  Procedure Laterality Date  . PARS PLANA VITRECTOMY Left 2015   WITH MEMBRANE PEEL, 23 GAUGE  . TRANSURETHRAL RESECTION OF PROSTATE    . Urethral stricture surgery      Current Outpatient Medications  Medication Sig Dispense Refill  . acetaminophen (TYLENOL) 500 MG tablet Take 1,000 mg by mouth every 6 (six) hours as needed for mild pain or moderate pain.    Marland Kitchen aspirin 81 MG tablet Take 81 mg by mouth daily.    . bifidobacterium infantis (ALIGN) capsule Take 1 capsule by mouth daily.    . Calcium Carbonate-Vitamin D (CALTRATE 600+D PO) Take 1 tablet by mouth daily.    Marland Kitchen COENZYME Q-10 PO Take 1 tablet by mouth daily.    Marland Kitchen desmopressin (DDAVP) 0.2 MG tablet Take 0.4 mg by mouth at bedtime.   0  . donepezil (ARICEPT) 5 MG tablet TAKE 1 TABLET BY MOUTH EVERYDAY AT BEDTIME 90 tablet 0  . FERREX 150 150 MG capsule Take 150 mg by mouth daily.    Marland Kitchen gabapentin (NEURONTIN) 100 MG capsule TAKE 1 CAPSULE BY MOUTH EVERYDAY AT BEDTIME  1  . meclizine (ANTIVERT) 25 MG tablet Take 25 mg by mouth daily.    . memantine (NAMENDA) 5 MG tablet Take 1 tablet by mouth 2 (two) times daily.    . Multiple Vitamins-Minerals (CENTRUM SILVER PO) Take 1 tablet by mouth daily.    . Omega-3 Fatty Acids (FISH OIL) 1000 MG CAPS Take 1 capsule (1,000 mg total) by mouth daily.  0  . Polyethyl Glycol-Propyl Glycol (SYSTANE OP) Apply 2 drops to eye daily as needed (Dry eyes). 2 drops each eye    . Red Yeast Rice 600 MG CAPS Take 1 capsule (600 mg total) by mouth daily. 90 capsule 2  . rOPINIRole (REQUIP) 1 MG tablet Take 2 tablets by mouth at bedtime.     . rosuvastatin (CRESTOR) 20 MG tablet Take 20 mg by mouth daily.     No current facility-administered medications for this visit.    Allergies as of 01/11/2021 - Review Complete 01/11/2021  Allergen Reaction Noted  . Sulfa antibiotics Nausea Only and Rash 09/05/2013  . Sulfamethoxazole Nausea Only and Rash 12/09/2011  .  Sulfasalazine Nausea Only and Rash 01/24/2014    Vitals: BP 122/64   Pulse 65   Ht 5\' 10"  (1.778 m)   Wt 202 lb (91.6 kg)   BMI 28.98 kg/m  Last Weight:  Wt Readings from Last 1 Encounters:  01/11/21 202 lb (91.6 kg)   KVQ:QVZD mass index is 28.98 kg/m.     Last Height:   Ht Readings from Last 1 Encounters:  01/11/21 5\' 10"  (1.778 m)    Physical exam:  General: The patient  is awake, alert and appears not in acute distress. The patient is well groomed. Head: Normocephalic, atraumatic. Neck is supple. Mallampati 2 with a tongue and uvula moving in midline, the uvula lifts above the tongue ground,  neck circumference: 15.5  Nasal airflow patent, Retrognathia is not seen.  Cardiovascular:  Regular rate and rhythm , without murmurs or carotid bruit, and without distended neck veins. Respiratory: Lungs are clear to auscultation. Skin:  Without evidence of edema, or rash Trunk: BMI is 27.69.    Neurologic exam : The patient is awake and alert, but reports  being challenged " orientation  to place and time" .   MOCA:No flowsheet data found.   MMSE: MMSE - Mini Mental State Exam 01/11/2021 09/18/2019 11/23/2017 10/11/2017  Orientation to time 0 1 3 1   Orientation to Place 4 3 5 4   Registration 3 3 3 3   Attention/ Calculation 1 1 5 5   Attention/Calculation-comments - - couldnt do numbers couldnt do the numbers  Recall 0 0 1 0  Language- name 2 objects 2 2 2 2   Language- repeat 1 1 1 1   Language- follow 3 step command 2 3 3 3   Language- read & follow direction 1 1 1 1   Write a sentence 1 1 1 1   Copy design 0 1 0 0  Total score 15 17 25 21      Attention span & concentration ability appears limited. The patient looks towards his wife for answers.  Speech is fluent, without dysarthria, dysphonia or aphasia.  Mood and affect are appropriate.  Cranial nerves: Pupils are equal and briskly reactive to light. Funduscopic exam without evidence of pallor or edema. Extraocular movements   in vertical and horizontal planes intact and without nystagmus. Visual fields by finger perimetry are intact. Hearing to finger rub intact- with hearing aids in place.  Facial sensation intact to fine touch. Facial motor strength is symmetric and tongue and uvula move midline. Shoulder shrug was symmetrical.   Motor exam:   Normal tone, muscle bulk and symmetric strength in all extremities. Sensory:  Fine touch, pinprick and vibration were tested in all extremities. Proprioception tested in the upper extremities was normal. Coordination: Rapid alternating movements in the fingers/hands were normal. Finger-to-nose maneuver  normal without evidence of ataxia, dysmetria or tremor. Gait and station: Patient walks without assistive device . Turns with 3  Steps.  Deep tendon reflexes: in the  upper and lower extremities are symmetric .  Study Performed:   Baseline Polysomnogram HISTORY:  The patient is a 80 year old Male and had in 2008 been evaluated for OSA, he was using an oxygen concentrator at the time and was issued a CPAP machine in addition after PSG. He could not get comfortable with CPAP, had air leaks, and aerophobia. Oxygen was finally prescribed alone - a compromise to maintain SpO2. During the last visit with his primary care physician, Dr. Leanna Beasley, the patient had also been tested for short-term memory concerns.  Reported that he could no longer remember some people he had known for many years by their names.  He is also easily distracted, sometimes forgets what day of the week it is.  In August 2018 pravastatin was discontinued and he started DDAVP to reduce nocturia and improve sleep quality. He has also used gabapentin 100 mg at bedtime which has helped with sleep.   In October 2018 his urologist confirmed that she seemed to be doing well.  His Cardiologist felt he was stable with  no changes to EKG in 07/2017.   The patient's sleep latency was 24 minutes.  REM latency was 112  minutes.  The sleep efficiency was 84.3 %.                     SLEEP ARCHITECTURE: WASO (Wake after sleep onset) was 54.5 minutes.  There were 13 minutes in Stage N1, 254 minutes Stage N2, 0 minutes Stage N3 and 64 minutes in Stage REM.  The percentage of Stage N1 was 3.9%, Stage N2 was 76.7%, Stage N3 was 0% and Stage R (REM sleep) was 19.3%.   RESPIRATORY ANALYSIS:  There were a total of 81 respiratory events:  6 obstructive apneas, 0 central apneas and 75 hypopneas with 0 respiratory event related arousals (RERAs). The total APNEA/HYPOPNEA INDEX (AHI) was 14.7/hour and the total RESPIRATORY DISTURBANCE INDEX was 14.7 /hour.  17 events occurred in REM sleep and 116 events in NREM. The REM AHI was 15.9 /hour, versus a non-REM AHI of 14.4. The patient spent 280 minutes of total sleep time in the supine position and 51 minutes in non-supine. The supine AHI was 16.1 versus a non-supine AHI of 7.1.  OXYGEN SATURATION & C02:  The Wake baseline 02 saturation was 94%, with the lowest being 73%. Time spent below 89% saturation equaled 14 minutes. The patient did not use oxygen.                                                          PERIODIC LIMB MOVEMENTS:  The patient had a total of 168 Periodic Limb Movements.  The Periodic Limb Movement (PLM) index was 30.5 and the PLM Arousal index was 2.7/hour. The arousals were noted as: 23 were spontaneous, 15 were associated with PLMs, and 82 were associated with respiratory events. Audio and video analysis did not show any abnormal or unusual movements, behaviors, phonations or vocalizations.   The patient took one bathroom breaks. Some Snoring was noted. EKG was regular. Post-study, the patient indicated that sleep was better than usual.    IMPRESSION:  1. Mild Obstructive Sleep Apnea/ hypopnea syndrome (OSA) at [facility] of 14/h, and only moderately accentuated in REM sleep and supine sleep.  2. Periodic Limb Movement Disorder (PLMD).  No hypoxemia noted-  neither oxygen not CPAP were implemented during this PSG.  1. There were frequent periodic limb movements of sleep (PLMS) with associated sleep disruption.  Consider treating the PLMS primarily. Avoid caffeine-containing beverages and chocolate  2. Positional therapy is advised. Neither oxygen not CPAP use are indicated.  3. Correlate clinically for a history consistent with regarding restless legs syndrome (RLS).    Consider secondary restless legs syndrome.  Pharmacotherapy may be warranted.  Obtain a serum ferritin level if the clinical history is consistent with RLS.  Consider iron therapy and evaluation for iron deficiency anemia if the serum ferritin level < 50 ng/ml.   4. A follow up appointment will be scheduled in the Sleep Clinic at [facility]. The referring provider will be notified of the results.      I certify that I have reviewed the entire raw data recording prior to the issuance of this report in accordance with the Standards of Accreditation of the [place] [employer] (AASM)     Asencion Partridge Celso Granja,  MD      11-21-2017  Diplomat, American Board of Psychiatry and Neurology  Diplomat, [place] of Sleep Medicine Medical Director, [employer] [facility] [employer] Provider CC Chart Rep sent to Donald Battles, MD  Sent 11/21/2017 Communication Routing History                 Orders Performed    Split night study Medication Changes    None   Medication List  Visit Diagnoses    Amnestic MCI (mild cognitive impairment with memory loss)   OSA (obstructive sleep apnea)   Problem List  Level of Service  All Charges for This Encounter  Code  95810  Description: PR POLYSOM 6/>YRS SLEEP 4/> ADDL PARAM ATTND  Service Date: 11/13/2017  Service Provider: Larey Seat, MD  Qty: 1     Assessment:  After physical and neurologic examination, review of laboratory studies,  Personal review of imaging  studies, reports of other /same  Imaging studies, results of polysomnography and / or neurophysiology testing and pre-existing records as far as provided in visit., my assessment is     1) EDS:  Resolved on ASV- CSA. 100 % compliant user with high air leak, FM/   [name] reports that he feels more sleepy now in daytime, easily dozes off when physically not active and not stimulated in other ways.  However he does not report sleep attacks, he has mild Apnea-  He agreed to rather try CPAP -a repeat sleep study in March 2019 The patient has been a very compliant CPAP user since he has used the machine 30 out of 30 days and 26 days over 4 hours consecutively.  Average use at time of 6 hours 59 minutes, minimum pressure is 5 and maximum pressure 10 cmH2O with 3 cm EPR he does have central apneas arising which is common in patients with neurodegenerative diseases.   CSA was found.   The new low Epworth score was 5/ 24.  2) Dementia.  Has stopped Vyacog after 4 years, since last year- [place] has been used -this is twice a day. He has bradycardia and would not be a good candidate for ARICEPT.  I think we can try 5 mg at night po. Increase from here. To 10 mg bid final dose.   3) no REM BD. No hallucinations, no parkinsonism. Continue with your exercise regimen.      Larey Seat, MD [id number], [id number] PM  Certified in Neurology by ABPN Certified in [place] by [facility] [address], [place], [place] [address]

## 2021-01-11 NOTE — Patient Instructions (Signed)
Memantine Tablets What is this medicine? MEMANTINE (MEM an teen) is used to treat dementia caused by Alzheimer's disease. This medicine may be used for other purposes; ask your health care provider or pharmacist if you have questions. COMMON BRAND NAME(S): Namenda What should I tell my health care provider before I take this medicine? They need to know if you have any of these conditions:  difficulty passing urine  kidney disease  liver disease  seizures  an unusual or allergic reaction to memantine, other medicines, foods, dyes, or preservatives  pregnant or trying to get pregnant  breast-feeding How should I use this medicine? Take this medicine by mouth with a glass of water. Follow the directions on the prescription label. You may take this medicine with or without food. Take your doses at regular intervals. Do not take your medicine more often than directed. Continue to take your medicine even if you feel better. Do not stop taking except on the advice of your doctor or health care professional. Talk to your pediatrician regarding the use of this medicine in children. Special care may be needed. Overdosage: If you think you have taken too much of this medicine contact a poison control center or emergency room at once. NOTE: This medicine is only for you. Do not share this medicine with others. What if I miss a dose? If you miss a dose, take it as soon as you can. If it is almost time for your next dose, take only that dose. Do not take double or extra doses. If you do not take your medicine for several days, contact your health care provider. Your dose may need to be changed. What may interact with this medicine?  acetazolamide  amantadine  cimetidine  dextromethorphan  dofetilide  hydrochlorothiazide  ketamine  metformin  methazolamide  quinidine  ranitidine  sodium bicarbonate  triamterene This list may not describe all possible interactions. Give your  health care provider a list of all the medicines, herbs, non-prescription drugs, or dietary supplements you use. Also tell them if you smoke, drink alcohol, or use illegal drugs. Some items may interact with your medicine. What should I watch for while using this medicine? Visit your doctor or health care professional for regular checks on your progress. Check with your doctor or health care professional if there is no improvement in your symptoms or if they get worse. You may get drowsy or dizzy. Do not drive, use machinery, or do anything that needs mental alertness until you know how this drug affects you. Do not stand or sit up quickly, especially if you are an older patient. This reduces the risk of dizzy or fainting spells. Alcohol can make you more drowsy and dizzy. Avoid alcoholic drinks. What side effects may I notice from receiving this medicine? Side effects that you should report to your doctor or health care professional as soon as possible:  allergic reactions like skin rash, itching or hives, swelling of the face, lips, or tongue  agitation or a feeling of restlessness  depressed mood  dizziness  hallucinations  redness, blistering, peeling or loosening of the skin, including inside the mouth  seizures  vomiting Side effects that usually do not require medical attention (report to your doctor or health care professional if they continue or are bothersome):  constipation  diarrhea  headache  nausea  trouble sleeping This list may not describe all possible side effects. Call your doctor for medical advice about side effects. You may report side effects  to FDA at 1-800-FDA-1088. Where should I keep my medicine? Keep out of the reach of children. Store at room temperature between 15 degrees and 30 degrees C (59 degrees and 86 degrees F). Throw away any unused medicine after the expiration date. NOTE: This sheet is a summary. It may not cover all possible information.  If you have questions about this medicine, talk to your doctor, pharmacist, or health care provider.  2021 Elsevier/Gold Standard (2013-06-03 14:10:42) Management of Memory Problems  There are some general things you can do to help manage your memory problems.  Your memory may not in fact recover, but by using techniques and strategies you will be able to manage your memory difficulties better.  1)  Establish a routine.  Try to establish and then stick to a regular routine.  By doing this, you will get used to what to expect and you will reduce the need to rely on your memory.  Also, try to do things at the same time of day, such as taking your medication or checking your calendar first thing in the morning.  Think about think that you can do as a part of a regular routine and make a list.  Then enter them into a daily planner to remind you.  This will help you establish a routine.  2)  Organize your environment.  Organize your environment so that it is uncluttered.  Decrease visual stimulation.  Place everyday items such as keys or cell phone in the same place every day (ie.  Basket next to front door)  Use post it notes with a brief message to yourself (ie. Turn off light, lock the door)  Use labels to indicate where things go (ie. Which cupboards are for food, dishes, etc.)  Keep a notepad and pen by the telephone to take messages  3)  Memory Aids  A diary or journal/notebook/daily planner  Making a list (shopping list, chore list, to do list that needs to be done)  Using an alarm as a reminder (kitchen timer or cell phone alarm)  Using cell phone to store information (Notes, Calendar, Reminders)  Calendar/White board placed in a prominent position  Post-it notes  In order for memory aids to be useful, you need to have good habits.  It's no good remembering to make a note in your journal if you don't remember to look in it.  Try setting aside a certain time of day to look in  journal.  4)  Improving mood and managing fatigue.  There may be other factors that contribute to memory difficulties.  Factors, such as anxiety, depression and tiredness can affect memory.  Regular gentle exercise can help improve your mood and give you more energy.  Simple relaxation techniques may help relieve symptoms of anxiety  Try to get back to completing activities or hobbies you enjoyed doing in the past.  Learn to pace yourself through activities to decrease fatigue.  Find out about some local support groups where you can share experiences with others.  Try and achieve 7-8 hours of sleep at night.

## 2021-01-21 DIAGNOSIS — Z85828 Personal history of other malignant neoplasm of skin: Secondary | ICD-10-CM | POA: Diagnosis not present

## 2021-01-21 DIAGNOSIS — L820 Inflamed seborrheic keratosis: Secondary | ICD-10-CM | POA: Diagnosis not present

## 2021-01-28 ENCOUNTER — Ambulatory Visit: Payer: Medicare Other

## 2021-02-05 NOTE — Progress Notes (Signed)
Happy Birthday, [name]. Your CPAP compliance is very good , 100%.  Average AHI is still elevated at 7.6/h and high air leaks were noted, but this result is an improvement over the untreated baseline. CD

## 2021-02-05 NOTE — Progress Notes (Signed)
High compliance of CPAP use, AHI data not decipherable. CD

## 2021-02-07 ENCOUNTER — Other Ambulatory Visit: Payer: Self-pay | Admitting: Neurology

## 2021-02-24 ENCOUNTER — Other Ambulatory Visit: Payer: Self-pay

## 2021-02-24 ENCOUNTER — Ambulatory Visit (INDEPENDENT_AMBULATORY_CARE_PROVIDER_SITE_OTHER): Payer: Medicare Other | Admitting: Orthopaedic Surgery

## 2021-02-24 DIAGNOSIS — G8929 Other chronic pain: Secondary | ICD-10-CM | POA: Diagnosis not present

## 2021-02-24 DIAGNOSIS — M7061 Trochanteric bursitis, right hip: Secondary | ICD-10-CM | POA: Diagnosis not present

## 2021-02-24 DIAGNOSIS — M25561 Pain in right knee: Secondary | ICD-10-CM

## 2021-02-24 MED ORDER — METHYLPREDNISOLONE ACETATE 40 MG/ML IJ SUSP
40.0000 mg | INTRAMUSCULAR | Status: AC | PRN
  Administered 2021-02-24: 40 mg via INTRA_ARTICULAR

## 2021-02-24 MED ORDER — LIDOCAINE HCL 1 % IJ SOLN
3.0000 mL | INTRAMUSCULAR | Status: AC | PRN
  Administered 2021-02-24: 3 mL

## 2021-02-24 NOTE — Progress Notes (Signed)
Office Visit Note   Patient: [name]           Date of Birth: 1940/12/22           MRN: [id number] Visit Date: 02/24/2021              Requested by: Leanna Battles, MD [place],  [place] [address] PCP: Leanna Battles, MD   Assessment & Plan: Visit Diagnoses:  1. Chronic pain of right knee   2. Trochanteric bursitis, right hip     Plan: I did place a steroid injection over the right hip trochanteric area and the right knee joint per the patient's request.  He understands the risks and benefits of injections.  He knows to wait at least 3 months or more between injections.  All questions and concerns were answered and addressed.  Follow-Up Instructions: No follow-ups on file.   Orders:  Orders Placed This Encounter  Procedures   Large Joint Inj   Large Joint Inj   No orders of the defined types were placed in this encounter.     Procedures: Large Joint Inj: R knee on 02/24/2021 2:52 PM Indications: diagnostic evaluation and pain Details: 22 G 1.5 in needle, superolateral approach  Arthrogram: No  Medications: 3 mL lidocaine 1 %; 40 mg methylPREDNISolone acetate 40 MG/ML Outcome: tolerated well, no immediate complications Procedure, treatment alternatives, risks and benefits explained, specific risks discussed. Consent was given by the patient. Immediately prior to procedure a time out was called to verify the correct patient, procedure, equipment, support staff and site/side marked as required. Patient was prepped and draped in the usual sterile fashion.    Large Joint Inj: R greater trochanter on 02/24/2021 2:52 PM Indications: pain and diagnostic evaluation Details: 22 G 1.5 in needle, lateral approach  Arthrogram: No  Medications: 3 mL lidocaine 1 %; 40 mg methylPREDNISolone acetate 40 MG/ML Outcome: tolerated well, no immediate complications Procedure, treatment alternatives, risks and benefits explained, specific risks discussed. Consent was  given by the patient. Immediately prior to procedure a time out was called to verify the correct patient, procedure, equipment, support staff and site/side marked as required. Patient was prepped and draped in the usual sterile fashion.      Clinical Data: No additional findings.   Subjective: Chief Complaint  Patient presents with   Right Knee - Pain  The patient is well-known to me.  He has chronic right knee pain from osteoarthritis and chronic right hip trochanteric bursitis.  He has been in from time to time for injections in these areas.  He has had no acute changes in medical status but he comes in today requesting a right hip trochanteric injection with a steroid in the right knee joint injection with a steroid.  He is 80 years old.  He is not interested in any type of surgical intervention.  He is not a diabetic.  He has had no other acute change in medical status.  He says the injections do last a while.  He points to the medial aspect of his right knee and the trochanteric area of his right hip  HPI  Review of Systems There is currently listed no headache, chest pain, shortness of breath, fever, chills, nausea, vomiting  Objective: Vital Signs: There were no vitals taken for this visit.  Physical Exam He is alert and orient x3 and in no acute distress Ortho Exam Examination of his right hip shows it moves smoothly and fluidly with pain  only on the trochanteric area.  The right knee has some slight medial joint line tenderness with some patellofemoral crepitation.  The range of motion is full. Specialty Comments:  No specialty comments available.  Imaging: No results found.   PMFS History: Patient Active Problem List   Diagnosis Date Noted   Encounter for counseling on adaptive servo-ventilation (ASV) use 01/11/2021   Central sleep apnea syndrome 01/11/2021   Treatment-emergent central sleep apnea 08/01/2020   PLMD (periodic limb movement disorder) 08/01/2020    Atherosclerosis of native coronary artery of native heart 08/01/2020   Nocturia more than twice per night 08/01/2020   Alzheimer's type dementia with late onset without behavioral disturbance ([place]) 09/18/2019   Tachycardia-bradycardia syndrome ([place]) 09/09/2019   Obstructive sleep apnea treated with continuous positive airway pressure (CPAP) 02/08/2018   Memory loss, short term 11/23/2017   Bilateral posterior capsular opacification 11/05/2015   Pseudophakia, both eyes 11/05/2015   Pseudophakia of left eye 04/24/2015   Nuclear sclerosis of both eyes 11/05/2014   Arteriosclerosis of coronary artery 12/31/2013   Acid reflux 12/31/2013   Hypercholesteremia 12/31/2013   OSA (obstructive sleep apnea) 12/31/2013   Head revolving around 12/31/2013   History of BPH 12/31/2013   GERD (gastroesophageal reflux disease) 12/31/2013   Vertigo 12/31/2013   CAD (coronary artery disease), native coronary artery 09/10/2013   Hyperlipidemia 09/10/2013   Essential hypertension 09/10/2013   Central retinal edema, 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 well-controlled with ranitidine.   BPH (benign prostatic hyperplasia)    a. s/p TURP 2012.   Essential hypertension 09/10/2013   Eye problem    a. L eye blurred vision - seeing eye doctor for tissue pulling away from eye.   Hard of hearing    Hiatal hernia    a. Small-mod by CXR 2015.   High cholesterol     Family History  Problem Relation Age of Onset   Other Mother        Died at 64 - "old age"   Stroke Father        Died at 63   Tuberculosis Paternal Grandfather    Dementia Brother    Stroke Brother    CAD Neg Hx     Past Surgical History:   Procedure Laterality Date   PARS PLANA VITRECTOMY Left 2015   WITH MEMBRANE PEEL, 23 GAUGE   TRANSURETHRAL RESECTION OF PROSTATE     Urethral stricture surgery     Social History   Occupational History   Not on file  Tobacco Use   Smoking status: Never   Smokeless tobacco: Never  Vaping Use   Vaping Use: Never used  Substance and Sexual Activity   Alcohol use: Yes    Comment: Once per month   Drug use: No   Sexual activity: Not on file

## 2021-03-11 ENCOUNTER — Other Ambulatory Visit: Payer: Self-pay | Admitting: Neurology

## 2021-03-19 DIAGNOSIS — N35014 Post-traumatic urethral stricture, male, unspecified: Secondary | ICD-10-CM | POA: Diagnosis not present

## 2021-03-19 DIAGNOSIS — N35011 Post-traumatic bulbous urethral stricture: Secondary | ICD-10-CM | POA: Diagnosis not present

## 2021-03-19 DIAGNOSIS — N400 Enlarged prostate without lower urinary tract symptoms: Secondary | ICD-10-CM | POA: Diagnosis not present

## 2021-04-26 DIAGNOSIS — D225 Melanocytic nevi of trunk: Secondary | ICD-10-CM | POA: Diagnosis not present

## 2021-04-26 DIAGNOSIS — L570 Actinic keratosis: Secondary | ICD-10-CM | POA: Diagnosis not present

## 2021-04-26 DIAGNOSIS — Z85828 Personal history of other malignant neoplasm of skin: Secondary | ICD-10-CM | POA: Diagnosis not present

## 2021-04-26 DIAGNOSIS — L821 Other seborrheic keratosis: Secondary | ICD-10-CM | POA: Diagnosis not present

## 2021-04-26 DIAGNOSIS — D1801 Hemangioma of skin and subcutaneous tissue: Secondary | ICD-10-CM | POA: Diagnosis not present

## 2021-05-29 DIAGNOSIS — Z23 Encounter for immunization: Secondary | ICD-10-CM | POA: Diagnosis not present

## 2021-06-07 DIAGNOSIS — I10 Essential (primary) hypertension: Secondary | ICD-10-CM | POA: Diagnosis not present

## 2021-06-07 DIAGNOSIS — G4734 Idiopathic sleep related nonobstructive alveolar hypoventilation: Secondary | ICD-10-CM | POA: Diagnosis not present

## 2021-06-07 DIAGNOSIS — G3184 Mild cognitive impairment, so stated: Secondary | ICD-10-CM | POA: Diagnosis not present

## 2021-06-07 DIAGNOSIS — E785 Hyperlipidemia, unspecified: Secondary | ICD-10-CM | POA: Diagnosis not present

## 2021-06-07 DIAGNOSIS — R42 Dizziness and giddiness: Secondary | ICD-10-CM | POA: Diagnosis not present

## 2021-06-17 DIAGNOSIS — Z23 Encounter for immunization: Secondary | ICD-10-CM | POA: Diagnosis not present

## 2021-07-08 ENCOUNTER — Other Ambulatory Visit: Payer: Self-pay

## 2021-07-08 ENCOUNTER — Ambulatory Visit (INDEPENDENT_AMBULATORY_CARE_PROVIDER_SITE_OTHER): Payer: Medicare Other | Admitting: Orthopaedic Surgery

## 2021-07-08 DIAGNOSIS — M25561 Pain in right knee: Secondary | ICD-10-CM

## 2021-07-08 DIAGNOSIS — M7061 Trochanteric bursitis, right hip: Secondary | ICD-10-CM

## 2021-07-08 DIAGNOSIS — M25551 Pain in right hip: Secondary | ICD-10-CM | POA: Diagnosis not present

## 2021-07-08 DIAGNOSIS — G8929 Other chronic pain: Secondary | ICD-10-CM | POA: Diagnosis not present

## 2021-07-08 MED ORDER — LIDOCAINE HCL 1 % IJ SOLN
3.0000 mL | INTRAMUSCULAR | Status: AC | PRN
  Administered 2021-07-08: 3 mL

## 2021-07-08 MED ORDER — METHYLPREDNISOLONE ACETATE 40 MG/ML IJ SUSP
40.0000 mg | INTRAMUSCULAR | Status: AC | PRN
  Administered 2021-07-08: 40 mg via INTRA_ARTICULAR

## 2021-07-08 NOTE — Progress Notes (Signed)
PATIENT: [name] DOB: Dec 25, 1940  REASON FOR VISIT: follow up HISTORY FROM: patient  HISTORY OF PRESENT ILLNESS: Today 07/12/21:  [name] is an 80 year old male with a history of obstructive sleep apnea on ASV and Alzheimer's disease.  OSA on ASV: CPAP compliance is excellent.  Patient denies any new issues.  States that he is due for new supplies.  Alzheimer's disease: Patient lives at home with his wife.  He does feel that his memory has gotten slightly worse.  He is able to complete all ADLs independently.  Reports that he still operates a motor vehicle without difficulty.  Denies any changes in mood or behavior.  His wife assists with his medications and appointments.  Although she states that he can handle this without her help.  The wife manages the finances this is unchanged.  He is taking Aricept 5 mg at bedtime.  The wife thinks that they may actually be taking 10 mg she will check the dosage at home.    07/06/20: [name] is an 80 year old male with a history of obstructive sleep apnea on CPAP.  His download indicates that he use his machine nightly for compliance of 100%.  He uses machine greater than 4 hours each night.  On average he uses his machine 8 hours and 5 minutes.  His residual AHI is 9.9 on 5 to 15 cm of water with EPR 3.  At the last visit we increased his pressure 5-10 but his apnea has worsened.  According to his download the majority of his events are central.  Therefore we may need to consider BiPAP therapy.  HISTORY  12/31/19:   [name] is a 80 year old male with a history of obstructive sleep apnea on CPAP.  He returns today for follow-up.  His download indicates that he uses machine 29 out of 30 days for compliance of 97%.  He uses machine greater than 4 hours 28 days for compliance of 93%.  On average he uses his machine 7 hours and 44 minutes.  His residual AHI is 8.4 on 5 to 10 cm of water with EPR 3.  Leak in the 95th percentile is 35.4 L/min.   Pressure in the 95th percentile is 9.3 and maximum pressure of 9.8.  Reports that he is tolerating the machine better.  He has found it beneficial.  REVIEW OF SYSTEMS: Out of a complete 14 system review of symptoms, the patient complains only of the following symptoms, and all other reviewed systems are negative.  ESS 6  ALLERGIES: Allergies  Allergen Reactions   Sulfa Antibiotics Nausea Only and Rash   Sulfamethoxazole Nausea Only and Rash   Sulfasalazine Nausea Only and Rash    HOME MEDICATIONS: Outpatient Medications Prior to Visit  Medication Sig Dispense Refill   acetaminophen (TYLENOL) 500 MG tablet Take 1,000 mg by mouth every 6 (six) hours as needed for mild pain or moderate pain.     aspirin 81 MG tablet Take 81 mg by mouth daily.     bifidobacterium infantis (ALIGN) capsule Take 1 capsule by mouth daily.     Calcium Carbonate-Vitamin D (CALTRATE 600+D PO) Take 1 tablet by mouth daily.     COENZYME Q-10 PO Take 1 tablet by mouth daily.     desmopressin (DDAVP) 0.2 MG tablet Take 0.4 mg by mouth at bedtime.   0   donepezil (ARICEPT) 5 MG tablet TAKE 1 TABLET BY MOUTH EVERYDAY AT BEDTIME 90 tablet 0   FERREX 150  150 MG capsule Take 150 mg by mouth daily.     gabapentin (NEURONTIN) 100 MG capsule TAKE 1 CAPSULE BY MOUTH EVERYDAY AT BEDTIME  1   meclizine (ANTIVERT) 25 MG tablet Take 25 mg by mouth daily.     memantine (NAMENDA) 10 MG tablet Take 1 tablet (10 mg total) by mouth 2 (two) times daily. 60 tablet 11   Multiple Vitamins-Minerals (CENTRUM SILVER PO) Take 1 tablet by mouth daily.     Omega-3 Fatty Acids (FISH OIL) 1000 MG CAPS Take 1 capsule (1,000 mg total) by mouth daily.  0   Polyethyl Glycol-Propyl Glycol (SYSTANE OP) Apply 2 drops to eye daily as needed (Dry eyes). 2 drops each eye     Red Yeast Rice 600 MG CAPS Take 1 capsule (600 mg total) by mouth daily. 90 capsule 2   rOPINIRole (REQUIP) 1 MG tablet Take 2 tablets by mouth at bedtime.      rosuvastatin  (CRESTOR) 20 MG tablet Take 20 mg by mouth daily.     No facility-administered medications prior to visit.    PAST MEDICAL HISTORY: Past Medical History:  Diagnosis Date   Acid reflux    a. well-controlled with ranitidine.   BPH (benign prostatic hyperplasia)    a. s/p TURP 2012.   Essential hypertension 09/10/2013   Eye problem    a. L eye blurred vision - seeing eye doctor for tissue pulling away from eye.   Hard of hearing    Hiatal hernia    a. Small-mod by CXR 2015.   High cholesterol     PAST SURGICAL HISTORY: Past Surgical History:  Procedure Laterality Date   PARS PLANA VITRECTOMY Left 2015   WITH MEMBRANE PEEL, 23 GAUGE   TRANSURETHRAL RESECTION OF PROSTATE     Urethral stricture surgery      FAMILY HISTORY: Family History  Problem Relation Age of Onset   Other Mother        Died at 21 - "old age"   Stroke Father        Died at 61   Tuberculosis Paternal Grandfather    Dementia Brother    Stroke Brother    CAD Neg Hx     SOCIAL HISTORY: Social History   Socioeconomic History   Marital status: Married    Spouse name: June   Number of children: Not on file   Years of education: Not on file   Highest education level: Not on file  Occupational History   Not on file  Tobacco Use   Smoking status: Never   Smokeless tobacco: Never  Vaping Use   Vaping Use: Never used  Substance and Sexual Activity   Alcohol use: Yes    Comment: Once per month   Drug use: No   Sexual activity: Not on file  Other Topics Concern   Not on file  Social History Narrative   Not on file   Social Determinants of Health   Financial Resource Strain: Not on file  Food Insecurity: Not on file  Transportation Needs: Not on file  Physical Activity: Not on file  Stress: Not on file  Social Connections: Not on file  Intimate Partner Violence: Not on file      PHYSICAL EXAM  Vitals:   07/12/21 1001  BP: 126/61  Pulse: (!) 55  Weight: 207 lb 3.2 oz (94 kg)   Height: 5\' 10"  (1.778 m)   Body mass index is 29.73 kg/m.  MMSE - Mini Mental  State Exam 07/12/2021 01/11/2021 09/18/2019  Orientation to time 2 0 1  Orientation to Place 3 4 3   Registration 3 3 3   Attention/ Calculation 1 1 1   Attention/Calculation-comments - - -  Recall 0 0 0  Language- name 2 objects 2 2 2   Language- repeat 1 1 1   Language- follow 3 step command 3 2 3   Language- read & follow direction 1 1 1   Write a sentence 1 1 1   Copy design 0 0 1  Total score 17 15 17      Generalized: Well developed, in no acute distress  Chest: Lungs clear to auscultation bilaterally  Neurological examination  Mentation: Alert oriented to time, place, history taking. Follows all commands speech and language fluent Cranial nerve II-XII: Extraocular movements were full, visual field were full on confrontational test Head turning and shoulder shrug  were normal and symmetric. Motor: The motor testing reveals 5 over 5 strength of all 4 extremities. Good symmetric motor tone is noted throughout.  Sensory: Sensory testing is intact to soft touch on all 4 extremities. No evidence of extinction is noted.  Gait and station: Gait is normal.    DIAGNOSTIC DATA (LABS, IMAGING, TESTING) - I reviewed patient records, labs, notes, testing and imaging myself where available.  Lab Results  Component Value Date   WBC 6.0 10/31/2018   HGB 13.6 10/31/2018   HCT 39.6 10/31/2018   MCV 92 10/31/2018   PLT 238 10/31/2018      Component Value Date/Time   NA 138 10/31/2018 0950   K 4.2 10/31/2018 0950   CL 105 10/31/2018 0950   CO2 27 10/31/2018 0950   GLUCOSE 108 (H) 10/31/2018 0950   GLUCOSE 106 (H) 01/20/2015 0930   BUN 16 10/31/2018 0950   CREATININE 1.01 10/31/2018 0950   CALCIUM 8.8 10/31/2018 0950   PROT 6.1 (L) 01/20/2015 0930   PROT 6.0 02/10/2014 0956   ALBUMIN 3.9 01/20/2015 0930   ALBUMIN 4.5 02/10/2014 0956   AST 28 01/20/2015 0930   ALT 24 01/20/2015 0930   ALKPHOS 48  01/20/2015 0930   BILITOT 0.9 01/20/2015 0930   GFRNONAA 71 10/31/2018 0950   GFRAA 82 10/31/2018 0950   Lab Results  Component Value Date   CHOL 203 (H) 09/25/2019   HDL 59 09/25/2019   LDLCALC 126 (H) 09/25/2019   TRIG 100 09/25/2019   CHOLHDL 3.4 09/25/2019     ASSESSMENT AND PLAN 80 y.o. year old male  has a past medical history of Acid reflux, BPH (benign prostatic hyperplasia), Essential hypertension (09/10/2013), Eye problem, Hard of hearing, Hiatal hernia, and High cholesterol. here with:  OSA on CPAP  - CPAP compliance excellent -Residual AHI slightly elevated.  The patient does have a significant leak with his mask.  Encouraged the patient to make sure his mask fits appropriately and encouraged him to change out his supplies regularly.   2.  Alzheimer's disease  - MMSE stable 17/30 previously 15/30 -Continue Aricept 5 mg at bedtime wife will check the dosage at home and call us back to verify if he is taking 5 mg or 10 mg. -Monitor heart rate  Follow-up in 6 months or sooner if needed    Ward Givens, MSN, NP-C 07/12/2021, 10:30 AM [facility] [address], [place], [place] [address] [phone]

## 2021-07-08 NOTE — Progress Notes (Signed)
Office Visit Note   Patient: [name]           Date of Birth: 1940-11-11           MRN: [id number] Visit Date: 07/08/2021              Requested by: Leanna Battles, MD [place],  [place] [address] PCP: Leanna Battles, MD   Assessment & Plan: Visit Diagnoses:  1. Chronic pain of right knee   2. Trochanteric bursitis, right hip     Plan: Per the patient's request I did provide a steroid injection in his right hip trochanteric area in his right knee which he tolerated well.  All questions and concerns were answered and addressed.  Follow-up can be as needed.  Follow-Up Instructions: Return if symptoms worsen or fail to improve.   Orders:  Orders Placed This Encounter  Procedures   Large Joint Inj   Large Joint Inj   No orders of the defined types were placed in this encounter.     Procedures: Large Joint Inj: R knee on 07/08/2021 3:05 PM Indications: diagnostic evaluation and pain Details: 22 G 1.5 in needle, superolateral approach  Arthrogram: No  Medications: 3 mL lidocaine 1 %; 40 mg methylPREDNISolone acetate 40 MG/ML Outcome: tolerated well, no immediate complications Procedure, treatment alternatives, risks and benefits explained, specific risks discussed. Consent was given by the patient. Immediately prior to procedure a time out was called to verify the correct patient, procedure, equipment, support staff and site/side marked as required. Patient was prepped and draped in the usual sterile fashion.    Large Joint Inj: R greater trochanter on 07/08/2021 3:05 PM Indications: pain and diagnostic evaluation Details: 22 G 1.5 in needle, lateral approach  Arthrogram: No  Medications: 3 mL lidocaine 1 %; 40 mg methylPREDNISolone acetate 40 MG/ML Outcome: tolerated well, no immediate complications Procedure, treatment alternatives, risks and benefits explained, specific risks discussed. Consent was given by the patient. Immediately prior to  procedure a time out was called to verify the correct patient, procedure, equipment, support staff and site/side marked as required. Patient was prepped and draped in the usual sterile fashion.      Clinical Data: No additional findings.   Subjective: Chief Complaint  Patient presents with   Right Hip - Pain   Right Knee - Pain  The patient is an 80 year old gentleman that we have seen before.  In June we did place a steroid injection in his right hip trochanteric area in his right knee.  He is requesting injections today.  He did have a mechanical fall in his yard last week landing on his right side on some soft ground.  He says he was having right-sided pain but that is subsiding.  He denies any acute change in medical status.  He denies any groin pain on the right side and denies any right knee swelling.  HPI  Review of Systems There is currently listed no headache, chest pain, shortness of breath, fever, chills, nausea, vomiting  Objective: Vital Signs: There were no vitals taken for this visit.  Physical Exam He is alert and orient x3 and in no acute distress Ortho Exam Examination of his right hip shows it moves smoothly and fluidly with no pain in the groin at all.  There is only pain over the trochanteric area of the right hip.  There is no evidence of fracture on clinical exam and he is weightbearing easily as tolerated.  Examination of  his right knee shows no effusion but just some global tenderness.  The right knee is ligamentously stable with good range of motion. Specialty Comments:  No specialty comments available.  Imaging: No results found.   PMFS History: Patient Active Problem List   Diagnosis Date Noted   Encounter for counseling on adaptive servo-ventilation (ASV) use 01/11/2021   Central sleep apnea syndrome 01/11/2021   Treatment-emergent central sleep apnea 08/01/2020   PLMD (periodic limb movement disorder) 08/01/2020   Atherosclerosis of native  coronary artery of native heart 08/01/2020   Nocturia more than twice per night 08/01/2020   Alzheimer's type dementia with late onset without behavioral disturbance ([place]) 09/18/2019   Tachycardia-bradycardia syndrome ([place]) 09/09/2019   Obstructive sleep apnea treated with continuous positive airway pressure (CPAP) 02/08/2018   Memory loss, short term 11/23/2017   Bilateral posterior capsular opacification 11/05/2015   Pseudophakia, both eyes 11/05/2015   Pseudophakia of left eye 04/24/2015   Nuclear sclerosis of both eyes 11/05/2014   Arteriosclerosis of coronary artery 12/31/2013   Acid reflux 12/31/2013   Hypercholesteremia 12/31/2013   OSA (obstructive sleep apnea) 12/31/2013   Head revolving around 12/31/2013   History of BPH 12/31/2013   GERD (gastroesophageal reflux disease) 12/31/2013   Vertigo 12/31/2013   CAD (coronary artery disease), native coronary artery 09/10/2013   Hyperlipidemia 09/10/2013   Essential hypertension 09/10/2013   Central retinal edema, 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 well-controlled with ranitidine.   BPH (benign prostatic hyperplasia)    a. s/p TURP 2012.   Essential hypertension 09/10/2013   Eye problem    a. L eye blurred vision - seeing eye doctor for tissue pulling away from eye.   Hard of hearing    Hiatal hernia    a. Small-mod by CXR 2015.   High cholesterol     Family History  Problem Relation Age of Onset   Other Mother        Died at 23 - "old age"   Stroke Father        Died at 28   Tuberculosis Paternal Grandfather    Dementia Brother    Stroke Brother    CAD Neg Hx     Past Surgical History:  Procedure Laterality Date    PARS PLANA VITRECTOMY Left 2015   WITH MEMBRANE PEEL, 23 GAUGE   TRANSURETHRAL RESECTION OF PROSTATE     Urethral stricture surgery     Social History   Occupational History   Not on file  Tobacco Use   Smoking status: Never   Smokeless tobacco: Never  Vaping Use   Vaping Use: Never used  Substance and Sexual Activity   Alcohol use: Yes    Comment: Once per month   Drug use: No   Sexual activity: Not on file

## 2021-07-12 ENCOUNTER — Encounter: Payer: Self-pay | Admitting: Adult Health

## 2021-07-12 ENCOUNTER — Ambulatory Visit (INDEPENDENT_AMBULATORY_CARE_PROVIDER_SITE_OTHER): Payer: Medicare Other | Admitting: Adult Health

## 2021-07-12 ENCOUNTER — Telehealth: Payer: Self-pay | Admitting: Adult Health

## 2021-07-12 VITALS — BP 126/61 | HR 55 | Ht 70.0 in | Wt 207.2 lb

## 2021-07-12 DIAGNOSIS — F0280 Dementia in other diseases classified elsewhere without behavioral disturbance: Secondary | ICD-10-CM

## 2021-07-12 DIAGNOSIS — Z9989 Dependence on other enabling machines and devices: Secondary | ICD-10-CM | POA: Diagnosis not present

## 2021-07-12 DIAGNOSIS — G301 Alzheimer's disease with late onset: Secondary | ICD-10-CM | POA: Diagnosis not present

## 2021-07-12 DIAGNOSIS — G4733 Obstructive sleep apnea (adult) (pediatric): Secondary | ICD-10-CM | POA: Diagnosis not present

## 2021-07-12 NOTE — Patient Instructions (Addendum)
Your Plan:  Continue CPAP  Continue Aricept 5 mg at bedtime. Will continue to monitor heart rate. Check dosage at home If your symptoms worsen or you develop new symptoms please let us know.    Thank you for coming to see [place] at [facility]. I hope we have been able to provide you high quality care today.  You may receive a patient satisfaction survey over the next few weeks. We would appreciate your feedback and comments so that we may continue to improve ourselves and the health of our patients.

## 2021-07-12 NOTE — Telephone Encounter (Signed)
Pt's wife is asking for a call to discuss pt's donepezil (ARICEPT) 5 MG tablet

## 2021-07-14 NOTE — Telephone Encounter (Signed)
I called wife.  Pt is taking aricept 5mg  po qhs and memantine 10mg  po bid as ordered.  She just wanted [place] to know.  I appreciated her call back.

## 2021-07-25 ENCOUNTER — Other Ambulatory Visit: Payer: Self-pay | Admitting: Neurology

## 2021-09-01 ENCOUNTER — Other Ambulatory Visit: Payer: Self-pay | Admitting: Neurology

## 2021-11-08 DIAGNOSIS — Z20822 Contact with and (suspected) exposure to covid-19: Secondary | ICD-10-CM | POA: Diagnosis not present

## 2021-12-09 DIAGNOSIS — Z20822 Contact with and (suspected) exposure to covid-19: Secondary | ICD-10-CM | POA: Diagnosis not present

## 2021-12-23 DIAGNOSIS — E785 Hyperlipidemia, unspecified: Secondary | ICD-10-CM | POA: Diagnosis not present

## 2021-12-23 DIAGNOSIS — I10 Essential (primary) hypertension: Secondary | ICD-10-CM | POA: Diagnosis not present

## 2021-12-23 DIAGNOSIS — Z125 Encounter for screening for malignant neoplasm of prostate: Secondary | ICD-10-CM | POA: Diagnosis not present

## 2021-12-24 DIAGNOSIS — Z20822 Contact with and (suspected) exposure to covid-19: Secondary | ICD-10-CM | POA: Diagnosis not present

## 2021-12-27 DIAGNOSIS — Z20822 Contact with and (suspected) exposure to covid-19: Secondary | ICD-10-CM | POA: Diagnosis not present

## 2021-12-28 DIAGNOSIS — R82998 Other abnormal findings in urine: Secondary | ICD-10-CM | POA: Diagnosis not present

## 2021-12-28 DIAGNOSIS — I10 Essential (primary) hypertension: Secondary | ICD-10-CM | POA: Diagnosis not present

## 2021-12-30 DIAGNOSIS — K219 Gastro-esophageal reflux disease without esophagitis: Secondary | ICD-10-CM | POA: Diagnosis not present

## 2021-12-30 DIAGNOSIS — H9193 Unspecified hearing loss, bilateral: Secondary | ICD-10-CM | POA: Diagnosis not present

## 2021-12-30 DIAGNOSIS — G3184 Mild cognitive impairment, so stated: Secondary | ICD-10-CM | POA: Diagnosis not present

## 2021-12-30 DIAGNOSIS — Z0000 Encounter for general adult medical examination without abnormal findings: Secondary | ICD-10-CM | POA: Diagnosis not present

## 2021-12-30 DIAGNOSIS — G4734 Idiopathic sleep related nonobstructive alveolar hypoventilation: Secondary | ICD-10-CM | POA: Diagnosis not present

## 2021-12-30 DIAGNOSIS — G4733 Obstructive sleep apnea (adult) (pediatric): Secondary | ICD-10-CM | POA: Diagnosis not present

## 2021-12-30 DIAGNOSIS — I10 Essential (primary) hypertension: Secondary | ICD-10-CM | POA: Diagnosis not present

## 2021-12-30 DIAGNOSIS — E785 Hyperlipidemia, unspecified: Secondary | ICD-10-CM | POA: Diagnosis not present

## 2022-01-11 ENCOUNTER — Ambulatory Visit (INDEPENDENT_AMBULATORY_CARE_PROVIDER_SITE_OTHER): Payer: Medicare Other | Admitting: Adult Health

## 2022-01-11 VITALS — BP 107/62 | HR 52 | Ht 69.0 in | Wt 206.8 lb

## 2022-01-11 DIAGNOSIS — F0280 Dementia in other diseases classified elsewhere without behavioral disturbance: Secondary | ICD-10-CM

## 2022-01-11 DIAGNOSIS — G301 Alzheimer's disease with late onset: Secondary | ICD-10-CM

## 2022-01-11 DIAGNOSIS — Z9989 Dependence on other enabling machines and devices: Secondary | ICD-10-CM

## 2022-01-11 DIAGNOSIS — G4733 Obstructive sleep apnea (adult) (pediatric): Secondary | ICD-10-CM

## 2022-01-11 NOTE — Progress Notes (Signed)
? ? ?PATIENT: [name] ?DOB: 11/21/1940 ? ?REASON FOR VISIT: follow up ?HISTORY FROM: patient ? ?Chief Complaint  ?Patient presents with  ? Follow-up  ?  Rm 5, wife.  Doing ok, wating on humidier replacement.  ? ? ? ?HISTORY OF PRESENT ILLNESS: ?Today 01/11/22: ? ?[name] is an 81 year old male with a history of obstructive sleep apnea on CPAP and Alzheimer's dementia.Marland Kitchen  He returns today for follow-up. ? ?OSA on ASV: Download shows residual AHI slightly elevated.  Patient does have a high leak. Wife states he is due for new supplies.  ? ?Memory: Feels that memory is the same. Lives at home with his wife. Able to complete ADLS independently. Operates a Teacher, music. Wife states that he sometimes will forget streets but she is always with him. Never drives alone. She doesn't notice any safety issues. Wife manages meds, appointments, and finances. Eating ok. Continues on namenda and Aricept.  ? ? ? ?07/12/21: [name] is an 81 year old male with a history of obstructive sleep apnea on ASV and Alzheimer's disease. ? ?OSA on ASV: CPAP compliance is excellent.  Patient denies any new issues.  States that he is due for new supplies. ? ?Alzheimer's disease: Patient lives at home with his wife.  He does feel that his memory has gotten slightly worse.  He is able to complete all ADLs independently.  Reports that he still operates a motor vehicle without difficulty.  Denies any changes in mood or behavior.  His wife assists with his medications and appointments.  Although she states that he can handle this without her help.  The wife manages the finances this is unchanged.  He is taking Aricept 5 mg at bedtime.  The wife thinks that they may actually be taking 10 mg she will check the dosage at home. ? ? ? ?07/06/20: [name] is an 81 year old male with a history of obstructive sleep apnea on CPAP.  His download indicates that he use his machine nightly for compliance of 100%.  He uses machine greater than 4 hours  each night.  On average he uses his machine 8 hours and 5 minutes.  His residual AHI is 9.9 on 5 to 15 cm of water with EPR 3.  At the last visit we increased his pressure 5-10 but his apnea has worsened.  According to his download the majority of his events are central.  Therefore we may need to consider BiPAP therapy. ? ?HISTORY  12/31/19: ?  ?[name] is a 81 year old male with a history of obstructive sleep apnea on CPAP.  He returns today for follow-up.  His download indicates that he uses machine 29 out of 30 days for compliance of 97%.  He uses machine greater than 4 hours 28 days for compliance of 93%.  On average he uses his machine 7 hours and 44 minutes.  His residual AHI is 8.4 on 5 to 10 cm of water with EPR 3.  Leak in the 95th percentile is 35.4 L/min.  Pressure in the 95th percentile is 9.3 and maximum pressure of 9.8.  Reports that he is tolerating the machine better.  He has found it beneficial. ? ?REVIEW OF SYSTEMS: Out of a complete 14 system review of symptoms, the patient complains only of the following symptoms, and all other reviewed systems are negative. ? ?ESS 4 ?FSS 17 ? ?ALLERGIES: ?Allergies  ?Allergen Reactions  ? Sulfa Antibiotics Nausea Only and Rash  ? Sulfamethoxazole Nausea Only and Rash  ?  Sulfasalazine Nausea Only and Rash  ? ? ?HOME MEDICATIONS: ?Outpatient Medications Prior to Visit  ?Medication Sig Dispense Refill  ? acetaminophen (TYLENOL) 500 MG tablet Take 1,000 mg by mouth every 6 (six) hours as needed for mild pain or moderate pain.    ? aspirin 81 MG tablet Take 81 mg by mouth daily.    ? bifidobacterium infantis (ALIGN) capsule Take 1 capsule by mouth daily.    ? Calcium Carbonate-Vitamin D (CALTRATE 600+D PO) Take 1 tablet by mouth daily.    ? COENZYME Q-10 PO Take 1 tablet by mouth daily.    ? desmopressin (DDAVP) 0.2 MG tablet Take 0.4 mg by mouth at bedtime.   0  ? donepezil (ARICEPT) 5 MG tablet TAKE 1 TABLET BY MOUTH EVERYDAY AT BEDTIME 90 tablet 3  ? FERREX 150  150 MG capsule Take 150 mg by mouth daily.    ? gabapentin (NEURONTIN) 100 MG capsule TAKE 1 CAPSULE BY MOUTH EVERYDAY AT BEDTIME  1  ? meclizine (ANTIVERT) 25 MG tablet Take 25 mg by mouth daily as needed.    ? memantine (NAMENDA) 10 MG tablet TAKE 1 TABLET BY MOUTH TWICE A DAY 180 tablet 4  ? Multiple Vitamins-Minerals (CENTRUM SILVER PO) Take 1 tablet by mouth daily.    ? Polyethyl Glycol-Propyl Glycol (SYSTANE OP) Apply 2 drops to eye daily as needed (Dry eyes). 2 drops each eye    ? rOPINIRole (REQUIP) 1 MG tablet Take 2 tablets by mouth at bedtime.     ? rosuvastatin (CRESTOR) 20 MG tablet Take 20 mg by mouth daily.    ? Omega-3 Fatty Acids (FISH OIL) 1000 MG CAPS Take 1 capsule (1,000 mg total) by mouth daily. (Patient not taking: Reported on 01/11/2022)  0  ? Red Yeast Rice 600 MG CAPS Take 1 capsule (600 mg total) by mouth daily. (Patient not taking: Reported on 01/11/2022) 90 capsule 2  ? ?No facility-administered medications prior to visit.  ? ? ?PAST MEDICAL HISTORY: ?Past Medical History:  ?Diagnosis Date  ? Acid reflux   ? a. well-controlled with ranitidine.  ? BPH (benign prostatic hyperplasia)   ? a. s/p TURP 2012.  ? Essential hypertension 09/10/2013  ? Eye problem   ? a. L eye blurred vision - seeing eye doctor for tissue pulling away from eye.  ? Hard of hearing   ? Hiatal hernia   ? a. Small-mod by CXR 2015.  ? High cholesterol   ? ? ?PAST SURGICAL HISTORY: ?Past Surgical History:  ?Procedure Laterality Date  ? PARS PLANA VITRECTOMY Left 2015  ? WITH MEMBRANE PEEL, 23 GAUGE  ? TRANSURETHRAL RESECTION OF PROSTATE    ? Urethral stricture surgery    ? ? ?FAMILY HISTORY: ?Family History  ?Problem Relation Age of Onset  ? Other Mother   ?     Died at 75 - "old age"  ? Stroke Father   ?     Died at 32  ? Tuberculosis Paternal Grandfather   ? Dementia Brother   ? Stroke Brother   ? CAD Neg Hx   ? ? ?SOCIAL HISTORY: ?Social History  ? ?Socioeconomic History  ? Marital status: Married  ?  Spouse name: June   ? Number of children: Not on file  ? Years of education: Not on file  ? Highest education level: Not on file  ?Occupational History  ? Not on file  ?Tobacco Use  ? Smoking status: Never  ? Smokeless tobacco: Never  ?  Vaping Use  ? Vaping Use: Never used  ?Substance and Sexual Activity  ? Alcohol use: Yes  ?  Comment: Once per month  ? Drug use: No  ? Sexual activity: Not on file  ?Other Topics Concern  ? Not on file  ?Social History Narrative  ? Not on file  ? ?Social Determinants of Health  ? ?Financial Resource Strain: Not on file  ?Food Insecurity: Not on file  ?Transportation Needs: Not on file  ?Physical Activity: Not on file  ?Stress: Not on file  ?Social Connections: Not on file  ?Intimate Partner Violence: Not on file  ? ? ? ? ?PHYSICAL EXAM ? ?Vitals:  ? 01/11/22 1045  ?BP: 107/62  ?Pulse: (!) 52  ?Weight: 206 lb 12.8 oz (93.8 kg)  ?Height: '5\' 9"'$  (1.753 m)  ? ?Body mass index is 30.54 kg/m?. ? ? ?  01/11/2022  ? 10:54 AM 07/12/2021  ? 10:10 AM 01/11/2021  ?  1:48 PM  ?MMSE - Mini Mental State Exam  ?Orientation to time 1 2 0  ?Orientation to Place '2 3 4  '$ ?Registration '3 3 3  '$ ?Attention/ Calculation '1 1 1  '$ ?Recall 0 0 0  ?Language- name 2 objects '2 2 2  '$ ?Language- repeat '1 1 1  '$ ?Language- follow 3 step command '3 3 2  '$ ?Language- read & follow direction '1 1 1  '$ ?Write a sentence '1 1 1  '$ ?Copy design 1 0 0  ?Total score '16 17 15  '$ ? ? ? ?Generalized: Well developed, in no acute distress  ?Chest: Lungs clear to auscultation bilaterally ? ?Neurological examination  ?Mentation: Alert oriented to time, place, history taking. Follows all commands speech and language fluent ?Cranial nerve II-XII: Extraocular movements were full, visual field were full on confrontational test Head turning and shoulder shrug  were normal and symmetric. ?Motor: The motor testing reveals 5 over 5 strength of all 4 extremities. Good symmetric motor tone is noted throughout.  ?Sensory: Sensory testing is intact to soft touch on all 4  extremities. No evidence of extinction is noted.  ?Gait and station: uses a cane when ambulating. Tandem gait not attempted ? ? ?DIAGNOSTIC DATA (LABS, IMAGING, TESTING) ?- I reviewed patient records, labs, notes,

## 2022-01-11 NOTE — Patient Instructions (Signed)
-   Memory score is  stable 16/30 previously 17/30 ?-Continue Aricept 5 mg at bedtime  ?- Continue Namenda 10 mg BID ?- Monitor heart rate ?- Continue CPAP, change mask  ?If your symptoms worsen or you develop new symptoms please let us know.  ? ?

## 2022-01-19 ENCOUNTER — Ambulatory Visit (INDEPENDENT_AMBULATORY_CARE_PROVIDER_SITE_OTHER): Payer: Medicare Other | Admitting: Orthopaedic Surgery

## 2022-01-19 ENCOUNTER — Encounter: Payer: Self-pay | Admitting: Orthopaedic Surgery

## 2022-01-19 DIAGNOSIS — M7061 Trochanteric bursitis, right hip: Secondary | ICD-10-CM

## 2022-01-19 DIAGNOSIS — G8929 Other chronic pain: Secondary | ICD-10-CM

## 2022-01-19 DIAGNOSIS — M25561 Pain in right knee: Secondary | ICD-10-CM

## 2022-01-19 MED ORDER — METHYLPREDNISOLONE ACETATE 40 MG/ML IJ SUSP
40.0000 mg | INTRAMUSCULAR | Status: AC | PRN
  Administered 2022-01-19: 40 mg via INTRA_ARTICULAR

## 2022-01-19 MED ORDER — LIDOCAINE HCL 1 % IJ SOLN
3.0000 mL | INTRAMUSCULAR | Status: AC | PRN
  Administered 2022-01-19: 3 mL

## 2022-01-19 NOTE — Progress Notes (Signed)
Office Visit Note   Patient: [name]           Date of Birth: 12-23-1940           MRN: [id number] Visit Date: 01/19/2022              Requested by: [name], MD [place],  [place] [address] PCP: [name], MD   Assessment & Plan: Visit Diagnoses:  1. Chronic pain of right knee   2. Trochanteric bursitis, right hip     Plan: I did provide a steroid injection over the right hip trochanteric area and in the right knee joint without difficulty.  All questions and concerns were answered and addressed.  Follow-up is as needed.  Follow-Up Instructions: Return if symptoms worsen or fail to improve.   Orders:  Orders Placed This Encounter  Procedures   Large Joint Inj   Large Joint Inj   No orders of the defined types were placed in this encounter.     Procedures: Large Joint Inj: R knee on 01/19/2022 1:40 PM Indications: diagnostic evaluation and pain Details: 22 G 1.5 in needle, superolateral approach  Arthrogram: No  Medications: 3 mL lidocaine 1 %; 40 mg methylPREDNISolone acetate 40 MG/ML Outcome: tolerated well, no immediate complications Procedure, treatment alternatives, risks and benefits explained, specific risks discussed. Consent was given by the patient. Immediately prior to procedure a time out was called to verify the correct patient, procedure, equipment, support staff and site/side marked as required. Patient was prepped and draped in the usual sterile fashion.    Large Joint Inj: R greater trochanter on 01/19/2022 1:40 PM Indications: pain and diagnostic evaluation Details: 22 G 1.5 in needle, lateral approach  Arthrogram: No  Medications: 3 mL lidocaine 1 %; 40 mg methylPREDNISolone acetate 40 MG/ML Outcome: tolerated well, no immediate complications Procedure, treatment alternatives, risks and benefits explained, specific risks discussed. Consent was given by the patient. Immediately prior to procedure a time out was  called to verify the correct patient, procedure, equipment, support staff and site/side marked as required. Patient was prepped and draped in the usual sterile fashion.      Clinical Data: No additional findings.   Subjective: Chief Complaint  Patient presents with   Right Knee - Follow-up   Right Hip - Follow-up  The patient is only seen before.  He is 81 years old and comes in with his wife today.  He ambulates with a cane.  He comes in for evaluation and treatment of right hip pain and right knee pain.  We have seen him for these areas before.  6 months ago he had a trochanteric injection in his right hip and injection in his right knee.  They have lasted until just recently.  They do work out twice a week with a Physiological scientist.  He has had no acute changes in medical status and is not diabetic.  He is requesting injections today.  He has had no acute changes in medical status.  He denies any groin pain on the right side.  He points to the trochanteric area as a source of his pain on the right side and the medial aspect of his right knee.  HPI  Review of Systems There is currently listed no fever, chills, nausea, vomiting  Objective: Vital Signs: There were no vitals taken for this visit.  Physical Exam He is alert and orient x3 and in no acute distress Ortho Exam Examination of  his right hip shows that moves smoothly and fluidly.  There is pain over the trochanteric area.  His right knee does have varus malalignment but no effusion and good range of motion of the knee with just some global tenderness.  The knee is ligamentously stable. Specialty Comments:  No specialty comments available.  Imaging: No results found.   PMFS History: Patient Active Problem List   Diagnosis Date Noted   Encounter for counseling on adaptive servo-ventilation (ASV) use 01/11/2021   Central sleep apnea syndrome 01/11/2021   Treatment-emergent central sleep apnea 08/01/2020   PLMD (periodic  limb movement disorder) 08/01/2020   Atherosclerosis of native coronary artery of native heart 08/01/2020   Nocturia more than twice per night 08/01/2020   Alzheimer's type dementia with late onset without behavioral disturbance ([place]) 09/18/2019   Tachycardia-bradycardia syndrome ([place]) 09/09/2019   Obstructive sleep apnea treated with continuous positive airway pressure (CPAP) 02/08/2018   Memory loss, short term 11/23/2017   Bilateral posterior capsular opacification 11/05/2015   Pseudophakia, both eyes 11/05/2015   Pseudophakia of left eye 04/24/2015   Nuclear sclerosis of both eyes 11/05/2014   Arteriosclerosis of coronary artery 12/31/2013   Acid reflux 12/31/2013   Hypercholesteremia 12/31/2013   OSA (obstructive sleep apnea) 12/31/2013   Head revolving around 12/31/2013   History of BPH 12/31/2013   GERD (gastroesophageal reflux disease) 12/31/2013   Vertigo 12/31/2013   CAD (coronary artery disease), native coronary artery 09/10/2013   Hyperlipidemia 09/10/2013   Essential hypertension 09/10/2013   Central retinal edema, 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 well-controlled with ranitidine.   BPH (benign prostatic hyperplasia)    a. s/p TURP 2012.   Essential hypertension 09/10/2013   Eye problem    a. L eye blurred vision - seeing eye doctor for tissue pulling away from eye.   Hard of hearing    Hiatal hernia    a. Small-mod by CXR 2015.   High cholesterol     Family History  Problem Relation Age of Onset   Other Mother        Died at 74 - "old age"   Stroke Father        Died at 67   Tuberculosis Paternal Grandfather    Dementia Brother    Stroke Brother    CAD Neg  Hx     Past Surgical History:  Procedure Laterality Date   PARS PLANA VITRECTOMY Left 2015   WITH MEMBRANE PEEL, 23 GAUGE   TRANSURETHRAL RESECTION OF PROSTATE     Urethral stricture surgery     Social History   Occupational History   Not on file  Tobacco Use   Smoking status: Never   Smokeless tobacco: Never  Vaping Use   Vaping Use: Never used  Substance and Sexual Activity   Alcohol use: Yes    Comment: Once per month   Drug use: No   Sexual activity: Not on file

## 2022-01-25 DIAGNOSIS — H903 Sensorineural hearing loss, bilateral: Secondary | ICD-10-CM | POA: Diagnosis not present

## 2022-03-16 DIAGNOSIS — N400 Enlarged prostate without lower urinary tract symptoms: Secondary | ICD-10-CM | POA: Diagnosis not present

## 2022-03-16 DIAGNOSIS — N35011 Post-traumatic bulbous urethral stricture: Secondary | ICD-10-CM | POA: Diagnosis not present

## 2022-06-04 DIAGNOSIS — Z23 Encounter for immunization: Secondary | ICD-10-CM | POA: Diagnosis not present

## 2022-06-05 ENCOUNTER — Other Ambulatory Visit: Payer: Self-pay | Admitting: Neurology

## 2022-06-16 DIAGNOSIS — Z23 Encounter for immunization: Secondary | ICD-10-CM | POA: Diagnosis not present

## 2022-07-26 DIAGNOSIS — D485 Neoplasm of uncertain behavior of skin: Secondary | ICD-10-CM | POA: Diagnosis not present

## 2022-07-26 DIAGNOSIS — Z85828 Personal history of other malignant neoplasm of skin: Secondary | ICD-10-CM | POA: Diagnosis not present

## 2022-07-26 DIAGNOSIS — L570 Actinic keratosis: Secondary | ICD-10-CM | POA: Diagnosis not present

## 2022-07-26 DIAGNOSIS — L821 Other seborrheic keratosis: Secondary | ICD-10-CM | POA: Diagnosis not present

## 2022-07-26 DIAGNOSIS — D04112 Carcinoma in situ of skin of right lower eyelid, including canthus: Secondary | ICD-10-CM | POA: Diagnosis not present

## 2022-07-26 DIAGNOSIS — D1801 Hemangioma of skin and subcutaneous tissue: Secondary | ICD-10-CM | POA: Diagnosis not present

## 2022-07-26 DIAGNOSIS — D225 Melanocytic nevi of trunk: Secondary | ICD-10-CM | POA: Diagnosis not present

## 2022-07-26 DIAGNOSIS — D0439 Carcinoma in situ of skin of other parts of face: Secondary | ICD-10-CM | POA: Diagnosis not present

## 2022-08-16 ENCOUNTER — Emergency Department (HOSPITAL_COMMUNITY)
Admission: EM | Admit: 2022-08-16 | Discharge: 2022-08-16 | Disposition: A | Discharge status: Home / Self Care | Payer: Medicare Other | Attending: Emergency Medicine | Admitting: Emergency Medicine

## 2022-08-16 ENCOUNTER — Emergency Department (HOSPITAL_COMMUNITY): Payer: Medicare Other

## 2022-08-16 ENCOUNTER — Encounter (HOSPITAL_COMMUNITY): Payer: Self-pay | Admitting: *Deleted

## 2022-08-16 DIAGNOSIS — I4430 Unspecified atrioventricular block: Secondary | ICD-10-CM | POA: Diagnosis not present

## 2022-08-16 DIAGNOSIS — R1013 Epigastric pain: Secondary | ICD-10-CM | POA: Insufficient documentation

## 2022-08-16 DIAGNOSIS — R001 Bradycardia, unspecified: Secondary | ICD-10-CM | POA: Insufficient documentation

## 2022-08-16 DIAGNOSIS — R7309 Other abnormal glucose: Secondary | ICD-10-CM | POA: Insufficient documentation

## 2022-08-16 DIAGNOSIS — I700 Atherosclerosis of aorta: Secondary | ICD-10-CM | POA: Diagnosis not present

## 2022-08-16 DIAGNOSIS — Z7982 Long term (current) use of aspirin: Secondary | ICD-10-CM | POA: Insufficient documentation

## 2022-08-16 DIAGNOSIS — I6501 Occlusion and stenosis of right vertebral artery: Secondary | ICD-10-CM | POA: Diagnosis not present

## 2022-08-16 DIAGNOSIS — I10 Essential (primary) hypertension: Secondary | ICD-10-CM | POA: Diagnosis not present

## 2022-08-16 DIAGNOSIS — R1110 Vomiting, unspecified: Secondary | ICD-10-CM | POA: Insufficient documentation

## 2022-08-16 DIAGNOSIS — R112 Nausea with vomiting, unspecified: Secondary | ICD-10-CM | POA: Diagnosis not present

## 2022-08-16 DIAGNOSIS — G319 Degenerative disease of nervous system, unspecified: Secondary | ICD-10-CM | POA: Diagnosis not present

## 2022-08-16 DIAGNOSIS — K449 Diaphragmatic hernia without obstruction or gangrene: Secondary | ICD-10-CM | POA: Diagnosis not present

## 2022-08-16 DIAGNOSIS — R42 Dizziness and giddiness: Secondary | ICD-10-CM | POA: Diagnosis not present

## 2022-08-16 DIAGNOSIS — N400 Enlarged prostate without lower urinary tract symptoms: Secondary | ICD-10-CM | POA: Diagnosis not present

## 2022-08-16 DIAGNOSIS — F0390 Unspecified dementia without behavioral disturbance: Secondary | ICD-10-CM | POA: Insufficient documentation

## 2022-08-16 DIAGNOSIS — I672 Cerebral atherosclerosis: Secondary | ICD-10-CM | POA: Diagnosis not present

## 2022-08-16 DIAGNOSIS — J811 Chronic pulmonary edema: Secondary | ICD-10-CM | POA: Diagnosis not present

## 2022-08-16 DIAGNOSIS — R9431 Abnormal electrocardiogram [ECG] [EKG]: Secondary | ICD-10-CM | POA: Diagnosis not present

## 2022-08-16 LAB — I-STAT CHEM 8, ED
BUN: 16 mg/dL (ref 8–23)
Calcium, Ion: 1.02 mmol/L — ABNORMAL LOW (ref 1.15–1.40)
Chloride: 105 mmol/L (ref 98–111)
Creatinine, Ser: 1 mg/dL (ref 0.61–1.24)
Glucose, Bld: 155 mg/dL — ABNORMAL HIGH (ref 70–99)
HCT: 42 % (ref 39.0–52.0)
Hemoglobin: 14.3 g/dL (ref 13.0–17.0)
Potassium: 4.2 mmol/L (ref 3.5–5.1)
Sodium: 139 mmol/L (ref 135–145)
TCO2: 21 mmol/L — ABNORMAL LOW (ref 22–32)

## 2022-08-16 LAB — MAGNESIUM: Magnesium: 2.3 mg/dL (ref 1.7–2.4)

## 2022-08-16 LAB — CBC WITH DIFFERENTIAL/PLATELET
Abs Immature Granulocytes: 0.03 10*3/uL (ref 0.00–0.07)
Basophils Absolute: 0 10*3/uL (ref 0.0–0.1)
Basophils Relative: 1 %
Eosinophils Absolute: 0.1 10*3/uL (ref 0.0–0.5)
Eosinophils Relative: 1 %
HCT: 42.9 % (ref 39.0–52.0)
Hemoglobin: 14.3 g/dL (ref 13.0–17.0)
Immature Granulocytes: 0 %
Lymphocytes Relative: 7 %
Lymphs Abs: 0.6 10*3/uL — ABNORMAL LOW (ref 0.7–4.0)
MCH: 32.3 pg (ref 26.0–34.0)
MCHC: 33.3 g/dL (ref 30.0–36.0)
MCV: 96.8 fL (ref 80.0–100.0)
Monocytes Absolute: 0.7 10*3/uL (ref 0.1–1.0)
Monocytes Relative: 9 %
Neutro Abs: 6.2 10*3/uL (ref 1.7–7.7)
Neutrophils Relative %: 82 %
Platelets: 220 10*3/uL (ref 150–400)
RBC: 4.43 MIL/uL (ref 4.22–5.81)
RDW: 12.5 % (ref 11.5–15.5)
WBC: 7.6 10*3/uL (ref 4.0–10.5)
nRBC: 0 % (ref 0.0–0.2)

## 2022-08-16 LAB — TROPONIN I (HIGH SENSITIVITY)
Troponin I (High Sensitivity): 4 ng/L (ref <18)
Troponin I (High Sensitivity): 6 ng/L (ref <18)

## 2022-08-16 LAB — COMPREHENSIVE METABOLIC PANEL
ALT: 30 U/L (ref 0–44)
AST: 31 U/L (ref 15–41)
Albumin: 4 g/dL (ref 3.5–5.0)
Alkaline Phosphatase: 46 U/L (ref 38–126)
Anion gap: 12 (ref 5–15)
BUN: 15 mg/dL (ref 8–23)
CO2: 21 mmol/L — ABNORMAL LOW (ref 22–32)
Calcium: 9.4 mg/dL (ref 8.9–10.3)
Chloride: 105 mmol/L (ref 98–111)
Creatinine, Ser: 1.1 mg/dL (ref 0.61–1.24)
GFR, Estimated: >60 mL/min (ref >60)
Glucose, Bld: 153 mg/dL — ABNORMAL HIGH (ref 70–99)
Potassium: 4.3 mmol/L (ref 3.5–5.1)
Sodium: 138 mmol/L (ref 135–145)
Total Bilirubin: 0.8 mg/dL (ref 0.3–1.2)
Total Protein: 6.3 g/dL — ABNORMAL LOW (ref 6.5–8.1)

## 2022-08-16 LAB — LACTIC ACID, PLASMA
Lactic Acid, Venous: 2.8 mmol/L (ref 0.5–1.9)
Lactic Acid, Venous: 1.5 mmol/L (ref 0.5–1.9)

## 2022-08-16 LAB — TSH: TSH: 1.186 u[IU]/mL (ref 0.350–4.500)

## 2022-08-16 LAB — LIPASE, BLOOD: Lipase: 36 U/L (ref 11–51)

## 2022-08-16 MED ORDER — MECLIZINE HCL 12.5 MG PO TABS: 12.5000 mg | ORAL_TABLET | Freq: Three times a day (TID) | ORAL | 0 refills | Status: DC | PRN

## 2022-08-16 MED ORDER — LORAZEPAM 2 MG/ML IJ SOLN
0.5000 mg | Freq: Once | INTRAMUSCULAR | Status: AC
  Administered 2022-08-16: 0.5 mg via INTRAVENOUS
  Filled 2022-08-16: qty 1

## 2022-08-16 MED ORDER — MAGNESIUM SULFATE IN D5W 1-5 GM/100ML-% IV SOLN
1.0000 g | Freq: Once | INTRAVENOUS | Status: AC
  Administered 2022-08-16: 1 g via INTRAVENOUS
  Filled 2022-08-16: qty 100

## 2022-08-16 MED ORDER — IOHEXOL 350 MG/ML SOLN
75.0000 mL | Freq: Once | INTRAVENOUS | Status: AC | PRN
  Administered 2022-08-16: 75 mL via INTRAVENOUS

## 2022-08-16 MED ORDER — ONDANSETRON HCL 4 MG/2ML IJ SOLN: 4.0000 mg | Freq: Once | INTRAMUSCULAR | Status: AC

## 2022-08-16 MED ORDER — SODIUM CHLORIDE 0.9 % IV BOLUS
500.0000 mL | Freq: Once | INTRAVENOUS | Status: AC
  Administered 2022-08-16: 500 mL via INTRAVENOUS

## 2022-08-16 MED ORDER — ONDANSETRON HCL 4 MG PO TABS: 4.0000 mg | ORAL_TABLET | Freq: Four times a day (QID) | ORAL | 0 refills | Status: AC

## 2022-08-16 MED ORDER — ONDANSETRON HCL 4 MG/2ML IJ SOLN
INTRAMUSCULAR | Status: AC
  Administered 2022-08-16: 4 mg via INTRAVENOUS
  Filled 2022-08-16: qty 2

## 2022-08-16 NOTE — ED Notes (Signed)
Bair hugger applied.

## 2022-08-16 NOTE — ED Notes (Addendum)
EDP at [facility], xray at [facility], wife at [facility], pt alert, NAD, restless, NV. Transcutaneous pads on, on zoll. VSS.

## 2022-08-16 NOTE — ED Notes (Signed)
Zofran given. CT beginning. Other CTs ordered. Tolerating well. Dr. Francia Greaves in scanner room.

## 2022-08-16 NOTE — ED Notes (Signed)
Dr. Francia Greaves at [facility]. Speaking with pt and spouse. Pt alert, NAD, calm, interactive, resps e/u, no changes, preoccupied with spitting. To CT now.

## 2022-08-16 NOTE — ED Provider Notes (Signed)
[facility] EMERGENCY DEPARTMENT Provider Note   CSN: [id number] Arrival date & time: 08/16/22  1420     History  Chief Complaint  Patient presents with   Emesis    Tyrian Peart Brys is a 81 y.o. male.  With PMH of HTN, HLD, GERD, dementia brought in by EMS from home for episode of dizziness and vomiting. LKN 0900  AM today.   With EMS, he was found to have bradycardia to the 40s and prolonged QTc.  They did not administer any medications.   History was obtained from wife since patient has dementia however he help participate in history taking.  Per wife, the last time patient was acting normally resent around 9 AM this morning when they had breakfast.  Later on in the day when he was walking around with his cane he felt like he was lightheaded and dizzy and having some pressure and discomfort in the right side of his head and noted that he felt like he was leaning to the right.  He did not have any slurred speech or word finding difficulty.  He sat down and did not fall or hit his head.  He is not on any anticoagulation or antiplatelets.  He was not complaining of any chest pain or of abdominal pain.  However then he proceeded to have nonbloody nonbilious emesis.  She called her PCP who recommended calling EMS.  She does not note any focal weakness or facial droop but was concern for possible stroke as his father had had a stroke.  There is no history of heart attack or stroke in the patient.  He is not on any beta-blockers or calcium channel blockers.  My speak with the patient he is only complaining of some mild epigastrium discomfort.  Denying any weakness numbness or tingling.   Emesis      Home Medications Prior to Admission medications   Medication Sig Start Date End Date Taking? Authorizing Provider  acetaminophen (TYLENOL) 500 MG tablet Take 1,000 mg by mouth every 6 (six) hours as needed for mild pain or moderate pain.    [provider]  aspirin 81 MG  tablet Take 81 mg by mouth daily.    [provider]  bifidobacterium infantis (ALIGN) capsule Take 1 capsule by mouth daily.    [provider]  Calcium Carbonate-Vitamin D (CALTRATE 600+D PO) Take 1 tablet by mouth daily.    [provider]  COENZYME Q-10 PO Take 1 tablet by mouth daily.    [provider]  desmopressin (DDAVP) 0.2 MG tablet Take 0.4 mg by mouth at bedtime.  09/20/17   [provider]  donepezil (ARICEPT) 5 MG tablet TAKE 1 TABLET BY MOUTH EVERYDAY AT BEDTIME 06/06/22   Dohmeier, Asencion Partridge, MD  FERREX 150 150 MG capsule Take 150 mg by mouth daily. 07/30/18   [provider]  gabapentin (NEURONTIN) 100 MG capsule TAKE 1 CAPSULE BY MOUTH EVERYDAY AT BEDTIME 09/03/17   [provider]  meclizine (ANTIVERT) 25 MG tablet Take 25 mg by mouth daily as needed. 06/05/13   [provider]  memantine (NAMENDA) 10 MG tablet TAKE 1 TABLET BY MOUTH TWICE A DAY 09/01/21   Ward Givens, NP  Multiple Vitamins-Minerals (CENTRUM SILVER PO) Take 1 tablet by mouth daily.    [provider]  Polyethyl Glycol-Propyl Glycol (SYSTANE OP) Apply 2 drops to eye daily as needed (Dry eyes). 2 drops each eye    [provider]  rOPINIRole (REQUIP) 1 MG tablet Take 2 tablets by mouth at bedtime.  10/22/18   [provider]  rosuvastatin (CRESTOR) 20 MG tablet Take 20 mg by mouth daily. 12/04/20   [provider]      Allergies    Sulfa antibiotics, Sulfamethoxazole, and Sulfasalazine    Review of Systems   Review of Systems  Gastrointestinal:  Positive for vomiting.    Physical Exam Updated Vital Signs Temp (!) 95.1 F (35.1 C) (Rectal)  Physical Exam Constitutional: Alert and oriented x2 person and place. Intermittently spitting up clear spit but no active vomiting. Eyes: Conjunctivae are normal. PERRL ENT      Head: Normocephalic and atraumatic.      Nose: No congestion.      Mouth/Throat: Mucous  membranes are moist.      Neck: No stridor. Cardiovascular: S1, S2,  bradycardic, equal DP pulses.Warm and well perfused. Respiratory: Normal respiratory effort Gastrointestinal: Soft and mild epigastrium tenderness no rebound or guarding Musculoskeletal: Normal range of motion in all extremities. No pitting edema of lower extremities Neurologic: AAOx2 (baseline per wife). PERRL. Tongue midline. No facial droop. EOMI mild left lateral nystagmus.  No drift of upper extremities.  Full strength of lower extremity no drift against gravity.  Sensation grossly intact. Normal finger to nose. Skin: Skin is warm, dry  Psychiatric: Mood and affect are normal. Speech and behavior are normal.  ED Results / Procedures / Treatments   Labs (all labs ordered are listed, but only abnormal results are displayed) Labs Reviewed  CBC WITH DIFFERENTIAL/PLATELET - Abnormal; Notable for the following components:      Result Value   Lymphs Abs 0.6 (*)    All other components within normal limits  I-STAT CHEM 8, ED - Abnormal; Notable for the following components:   Glucose, Bld 155 (*)    Calcium, Ion 1.02 (*)    TCO2 21 (*)    All other components within normal limits  COMPREHENSIVE METABOLIC PANEL  LIPASE, BLOOD  MAGNESIUM  LACTIC ACID, PLASMA  LACTIC ACID, PLASMA  TSH  TROPONIN I (HIGH SENSITIVITY)    EKG EKG Interpretation  Date/Time:  Tuesday August 16 2022 14:42:09 EST Ventricular Rate:  42 PR Interval:  182 QRS Duration: 106 QT Interval:  551 QTC Calculation: 461 R Axis:   26 Text Interpretation: Sinus bradycardia Abnormal R-wave progression, early transition Confirmed by Dene Gentry [phone]) on 08/16/2022 2:57:41 PM  Radiology DG Chest Portable 1 View  Result Date: 08/16/2022 CLINICAL DATA:  Bradycardia.  Nausea vomiting. EXAM: PORTABLE CHEST 1 VIEW COMPARISON:  09/05/2013 FINDINGS: 1445 hours. The lungs are clear without focal pneumonia, edema, pneumothorax or pleural effusion.  There is pulmonary vascular congestion without overt pulmonary edema. The cardio pericardial silhouette is enlarged. The visualized bony structures of the thorax are unremarkable. Telemetry leads overlie the chest. Hiatal hernia again noted. IMPRESSION: Pulmonary vascular congestion without overt pulmonary edema. Electronically Signed   By: Misty Stanley M.D.   On: 08/16/2022 14:52    Procedures Procedures  Remain on constant cardiac monitoring, sinus bradycardia.  Medications Ordered in ED Medications  magnesium sulfate IVPB 1 g 100 mL (has no administration in time range)  ondansetron (ZOFRAN) injection 4 mg (has no administration in time range)  ondansetron (ZOFRAN) 4 MG/2ML injection (has no administration in time range)    ED Course/ Medical Decision Making/ A&P  Medical Decision Making  DAMAREA MERKEL is a 81 y.o. male.  With PMH of HTN, HLD, GERD, dementia brought in by EMS from home for episode of dizziness and vomiting. LKN 0900  AM today.   With EMS, he was found to have bradycardia to the 40s and prolonged QTc.  They did not administer any medications.  Patient presented hypertensive 170s/70s with bradycardia.  Based on patient's history and presentation, consider peripheral vertigo versus posterior stroke versus atypical ACS versus GI pathology such as acute enteritis, pancreatitis although less likely with generally benign abdominal exam.  Initial EKG obtained sinus bradycardia although limited by motion artifact.  No acute ST/T changes indicating acute ischemia glucose 155 on i-STAT.  Sodium 139. K 4.2.  CTH, CTA head/neck, CTAP with contrast all ordered with troponin and full labs. Signed out to Dr Francia Greaves pending. Likely plans for admission.  Amount and/or Complexity of Data Reviewed Labs: ordered. Radiology: ordered.  Risk Prescription drug management.   Final Clinical Impression(s) / ED Diagnoses Final diagnoses:  None    Rx / DC  Orders ED Discharge Orders     None         Elgie Congo, MD 08/16/22 1534

## 2022-08-16 NOTE — ED Provider Notes (Signed)
Patient seen after prior ED provider.  Initial CT imaging is without evidence of acute normality.  Screening labs obtained are without significant abnormality.  During period of time waiting for MRI patient's symptoms resolved completely.  MRI is without evidence of acute abnormality.  Patient and patient's wife are comfortable with plan to discharge home.  Patient is taking p.o. well prior to DC.  Patient without residual symptoms such as dizziness or lightheadedness.  Patient's presentation is perhaps most consistent with an episode of benign positional vertigo.  Patient and patient's wife understand need for close outpatient follow-up.  Strict return precautions given and discharged.   Valarie Merino, MD 08/16/22 2248

## 2022-08-16 NOTE — ED Notes (Signed)
Returned to monitor, Zoll remains in place. HR 50. Wife reports h/o same, baseline HR. Returned to [facility]. Continues to frequently spit into emesis bag.

## 2022-08-16 NOTE — ED Notes (Signed)
CT done, no changes, back to room 23.

## 2022-08-16 NOTE — ED Notes (Signed)
Dr. Nechama Guard speaking with pt and spouse.

## 2022-08-16 NOTE — Discharge Instructions (Addendum)
Return for any problem.  ?

## 2022-08-16 NOTE — ED Triage Notes (Signed)
Pt BIB EMS from home complaining of nausea, vomiting, and dizziness that has been occurring all morning. Pt has a prolonged QTC and bradycardic. Per family, unknown if patient takes a beta blocker. Dementia at baseline.  136/80 CBG 163 RR 20 98%  20# L AC

## 2022-08-24 DIAGNOSIS — I10 Essential (primary) hypertension: Secondary | ICD-10-CM | POA: Diagnosis not present

## 2022-08-24 DIAGNOSIS — R110 Nausea: Secondary | ICD-10-CM | POA: Diagnosis not present

## 2022-08-24 DIAGNOSIS — G4733 Obstructive sleep apnea (adult) (pediatric): Secondary | ICD-10-CM | POA: Diagnosis not present

## 2022-08-24 DIAGNOSIS — H8111 Benign paroxysmal vertigo, right ear: Secondary | ICD-10-CM | POA: Diagnosis not present

## 2022-08-24 DIAGNOSIS — R42 Dizziness and giddiness: Secondary | ICD-10-CM | POA: Diagnosis not present

## 2022-08-24 DIAGNOSIS — F03B0 Unspecified dementia, moderate, without behavioral disturbance, psychotic disturbance, mood disturbance, and anxiety: Secondary | ICD-10-CM | POA: Diagnosis not present

## 2022-09-01 DIAGNOSIS — H8111 Benign paroxysmal vertigo, right ear: Secondary | ICD-10-CM | POA: Diagnosis not present

## 2022-09-02 ENCOUNTER — Other Ambulatory Visit: Payer: Self-pay | Admitting: Adult Health

## 2022-09-05 DIAGNOSIS — H8111 Benign paroxysmal vertigo, right ear: Secondary | ICD-10-CM | POA: Diagnosis not present

## 2022-09-08 DIAGNOSIS — H8111 Benign paroxysmal vertigo, right ear: Secondary | ICD-10-CM | POA: Diagnosis not present

## 2022-10-04 DIAGNOSIS — J029 Acute pharyngitis, unspecified: Secondary | ICD-10-CM | POA: Diagnosis not present

## 2022-10-04 DIAGNOSIS — R5383 Other fatigue: Secondary | ICD-10-CM | POA: Diagnosis not present

## 2022-10-04 DIAGNOSIS — Z1152 Encounter for screening for COVID-19: Secondary | ICD-10-CM | POA: Diagnosis not present

## 2022-10-04 DIAGNOSIS — I10 Essential (primary) hypertension: Secondary | ICD-10-CM | POA: Diagnosis not present

## 2022-10-04 DIAGNOSIS — R051 Acute cough: Secondary | ICD-10-CM | POA: Diagnosis not present

## 2022-10-04 DIAGNOSIS — B349 Viral infection, unspecified: Secondary | ICD-10-CM | POA: Diagnosis not present

## 2022-10-04 DIAGNOSIS — F03B0 Unspecified dementia, moderate, without behavioral disturbance, psychotic disturbance, mood disturbance, and anxiety: Secondary | ICD-10-CM | POA: Diagnosis not present

## 2022-10-04 DIAGNOSIS — R0981 Nasal congestion: Secondary | ICD-10-CM | POA: Diagnosis not present

## 2023-01-10 DIAGNOSIS — Z125 Encounter for screening for malignant neoplasm of prostate: Secondary | ICD-10-CM | POA: Diagnosis not present

## 2023-01-10 DIAGNOSIS — K219 Gastro-esophageal reflux disease without esophagitis: Secondary | ICD-10-CM | POA: Diagnosis not present

## 2023-01-10 DIAGNOSIS — E785 Hyperlipidemia, unspecified: Secondary | ICD-10-CM | POA: Diagnosis not present

## 2023-01-10 DIAGNOSIS — I10 Essential (primary) hypertension: Secondary | ICD-10-CM | POA: Diagnosis not present

## 2023-01-10 DIAGNOSIS — R7989 Other specified abnormal findings of blood chemistry: Secondary | ICD-10-CM | POA: Diagnosis not present

## 2023-01-12 ENCOUNTER — Ambulatory Visit (INDEPENDENT_AMBULATORY_CARE_PROVIDER_SITE_OTHER): Payer: Medicare Other | Admitting: Adult Health

## 2023-01-12 ENCOUNTER — Encounter: Payer: Self-pay | Admitting: Adult Health

## 2023-01-12 VITALS — BP 104/47 | HR 54 | Ht 69.0 in | Wt 207.0 lb

## 2023-01-12 DIAGNOSIS — G301 Alzheimer's disease with late onset: Secondary | ICD-10-CM | POA: Diagnosis not present

## 2023-01-12 DIAGNOSIS — G4733 Obstructive sleep apnea (adult) (pediatric): Secondary | ICD-10-CM

## 2023-01-12 DIAGNOSIS — F0280 Dementia in other diseases classified elsewhere without behavioral disturbance: Secondary | ICD-10-CM

## 2023-01-12 NOTE — Progress Notes (Signed)
PATIENT: [name] DOB: 11-29-1940  REASON FOR VISIT: follow up HISTORY FROM: patient  Chief Complaint  Patient presents with   Follow-up    Pt in 4 with wife Pt here for CPAP /Memory f/u Pt states no questions or concerns about CPAP Pt and wife states memory same      HISTORY OF PRESENT ILLNESS: Today 01/12/23:  [name] is a 82 y.o. male with a history of obstructive sleep apnea on CPAP and Alzheimer's disease. Returns today for follow-up.   Obstructive sleep apnea on ASV: DL is below.   Alzheimer's disease: Patient his wife feel that his memory has remained stable.  He continues to live at home with his wife.  Able to complete all ADLs independently.  His wife continues to manage his medications, appointments and finances.  He remains on Aricept and Namenda.  Wife drives to most places. She states that he may park the car. No change in mood or behavior.      01/11/22: [name] is an 82 year old male with a history of obstructive sleep apnea on CPAP and Alzheimer's dementia.Marland Kitchen  He returns today for follow-up.  OSA on ASV: Download shows residual AHI slightly elevated.  Patient does have a high leak. Wife states he is due for new supplies.   Memory: Feels that memory is the same. Lives at home with his wife. Able to complete ADLS independently. Operates a Librarian, academic. Wife states that he sometimes will forget streets but she is always with him. Never drives alone. She doesn't notice any safety issues. Wife manages meds, appointments, and finances. Eating ok. Continues on namenda and Aricept.     07/12/21: [name] is an 82 year old male with a history of obstructive sleep apnea on ASV and Alzheimer's disease.  OSA on ASV: CPAP compliance is excellent.  Patient denies any new issues.  States that he is due for new supplies.  Alzheimer's disease: Patient lives at home with his wife.  He does feel that his memory has gotten slightly worse.  He is able to complete  all ADLs independently.  Reports that he still operates a motor vehicle without difficulty.  Denies any changes in mood or behavior.  His wife assists with his medications and appointments.  Although she states that he can handle this without her help.  The wife manages the finances this is unchanged.  He is taking Aricept 5 mg at bedtime.  The wife thinks that they may actually be taking 10 mg she will check the dosage at home.    07/06/20: [name] is an 82 year old male with a history of obstructive sleep apnea on CPAP.  His download indicates that he use his machine nightly for compliance of 100%.  He uses machine greater than 4 hours each night.  On average he uses his machine 8 hours and 5 minutes.  His residual AHI is 9.9 on 5 to 15 cm of water with EPR 3.  At the last visit we increased his pressure 5-10 but his apnea has worsened.  According to his download the majority of his events are central.  Therefore we may need to consider BiPAP therapy.  HISTORY  12/31/19:   [name] is a 82 year old male with a history of obstructive sleep apnea on CPAP.  He returns today for follow-up.  His download indicates that he uses machine 29 out of 30 days for compliance of 97%.  He uses machine greater than 4 hours 28 days  for compliance of 93%.  On average he uses his machine 7 hours and 44 minutes.  His residual AHI is 8.4 on 5 to 10 cm of water with EPR 3.  Leak in the 95th percentile is 35.4 L/min.  Pressure in the 95th percentile is 9.3 and maximum pressure of 9.8.  Reports that he is tolerating the machine better.  He has found it beneficial.  REVIEW OF SYSTEMS: Out of a complete 14 system review of symptoms, the patient complains only of the following symptoms, and all other reviewed systems are negative.  ESS 4 FSS 17  ALLERGIES: Allergies  Allergen Reactions   Sulfa Antibiotics Nausea Only and Rash   Sulfamethoxazole Nausea Only and Rash   Sulfasalazine Nausea Only and Rash    HOME  MEDICATIONS: Outpatient Medications Prior to Visit  Medication Sig Dispense Refill   acetaminophen (TYLENOL) 500 MG tablet Take 1,000 mg by mouth every 6 (six) hours as needed for mild pain or moderate pain.     aspirin 81 MG tablet Take 81 mg by mouth daily.     bifidobacterium infantis (ALIGN) capsule Take 1 capsule by mouth daily.     Calcium Carbonate-Vitamin D (CALTRATE 600+D PO) Take 1 tablet by mouth daily.     COENZYME Q-10 PO Take 1 tablet by mouth daily.     desmopressin (DDAVP) 0.2 MG tablet Take 0.4 mg by mouth at bedtime.   0   donepezil (ARICEPT) 5 MG tablet TAKE 1 TABLET BY MOUTH EVERYDAY AT BEDTIME 90 tablet 3   FERREX 150 150 MG capsule Take 150 mg by mouth daily.     gabapentin (NEURONTIN) 100 MG capsule TAKE 1 CAPSULE BY MOUTH EVERYDAY AT BEDTIME  1   meclizine (ANTIVERT) 12.5 MG tablet Take 1 tablet (12.5 mg total) by mouth 3 (three) times daily as needed for dizziness. 30 tablet 0   meclizine (ANTIVERT) 25 MG tablet Take 25 mg by mouth daily as needed.     memantine (NAMENDA) 10 MG tablet TAKE 1 TABLET BY MOUTH TWICE A DAY 180 tablet 4   Multiple Vitamins-Minerals (CENTRUM SILVER PO) Take 1 tablet by mouth daily.     ondansetron (ZOFRAN) 4 MG tablet Take 1 tablet (4 mg total) by mouth every 6 (six) hours. 12 tablet 0   Polyethyl Glycol-Propyl Glycol (SYSTANE OP) Apply 2 drops to eye daily as needed (Dry eyes). 2 drops each eye     rOPINIRole (REQUIP) 1 MG tablet Take 2 tablets by mouth at bedtime.      rosuvastatin (CRESTOR) 20 MG tablet Take 20 mg by mouth daily.     No facility-administered medications prior to visit.    PAST MEDICAL HISTORY: Past Medical History:  Diagnosis Date   Acid reflux    a. well-controlled with ranitidine.   BPH (benign prostatic hyperplasia)    a. s/p TURP 2012.   Essential hypertension 09/10/2013   Eye problem    a. L eye blurred vision - seeing eye doctor for tissue pulling away from eye.   Hard of hearing    Hiatal hernia    a.  Small-mod by CXR 2015.   High cholesterol     PAST SURGICAL HISTORY: Past Surgical History:  Procedure Laterality Date   PARS PLANA VITRECTOMY Left 2015   WITH MEMBRANE PEEL, 23 GAUGE   TRANSURETHRAL RESECTION OF PROSTATE     Urethral stricture surgery      FAMILY HISTORY: Family History  Problem Relation Age of Onset  Other Mother        Died at 64 - "old age"   Stroke Father        Died at 82   Tuberculosis Paternal Grandfather    Dementia Brother    Stroke Brother    CAD Neg Hx     SOCIAL HISTORY: Social History   Socioeconomic History   Marital status: Married    Spouse name: June   Number of children: Not on file   Years of education: Not on file   Highest education level: Not on file  Occupational History   Not on file  Tobacco Use   Smoking status: Never   Smokeless tobacco: Never  Vaping Use   Vaping Use: Never used  Substance and Sexual Activity   Alcohol use: Yes    Comment: Once per month   Drug use: No   Sexual activity: Not on file  Other Topics Concern   Not on file  Social History Narrative   Not on file   Social Determinants of Health   Financial Resource Strain: Not on file  Food Insecurity: Not on file  Transportation Needs: Not on file  Physical Activity: Not on file  Stress: Not on file  Social Connections: Not on file  Intimate Partner Violence: Not on file      PHYSICAL EXAM  Vitals:   01/12/23 1103  BP: (!) 104/47  Pulse: (!) 54  Weight: 207 lb (93.9 kg)  Height: 5\' 9"  (1.753 m)    Body mass index is 30.57 kg/m.     01/12/2023   11:04 AM 01/11/2022   10:54 AM 07/12/2021   10:10 AM  MMSE - Mini Mental State Exam  Orientation to time 0 1 2  Orientation to Place 2 2 3   Registration 3 3 3   Attention/ Calculation 0 1 1  Recall 0 0 0  Language- name 2 objects 2 2 2   Language- repeat 1 1 1   Language- follow 3 step command 3 3 3   Language- read & follow direction 1 1 1   Write a sentence 1 1 1   Copy design 0 1  0  Total score 13 16 17      Generalized: Well developed, in no acute distress  Chest: Lungs clear to auscultation bilaterally  Neurological examination  Mentation: Alert oriented to time, place, history taking. Follows all commands speech and language fluent Cranial nerve II-XII: Extraocular movements were full, visual field were full on confrontational test Head turning and shoulder shrug  were normal and symmetric. Motor: The motor testing reveals 5 over 5 strength of all 4 extremities. Good symmetric motor tone is noted throughout.  Sensory: Sensory testing is intact to soft touch on all 4 extremities. No evidence of extinction is noted.  Gait and station: uses a cane when ambulating. Tandem gait not attempted   DIAGNOSTIC DATA (LABS, IMAGING, TESTING) - I reviewed patient records, labs, notes, testing and imaging myself where available.  Lab Results  Component Value Date   WBC 7.6 08/16/2022   HGB 14.3 08/16/2022   HCT 42.0 08/16/2022   MCV 96.8 08/16/2022   PLT 220 08/16/2022      Component Value Date/Time   NA 139 08/16/2022 1503   NA 138 10/31/2018 0950   K 4.2 08/16/2022 1503   CL 105 08/16/2022 1503   CO2 21 (L) 08/16/2022 1420   GLUCOSE 155 (H) 08/16/2022 1503   BUN 16 08/16/2022 1503   BUN 16 10/31/2018 0950   CREATININE  1.00 08/16/2022 1503   CALCIUM 9.4 08/16/2022 1420   PROT 6.3 (L) 08/16/2022 1420   PROT 6.0 02/10/2014 0956   ALBUMIN 4.0 08/16/2022 1420   ALBUMIN 4.5 02/10/2014 0956   AST 31 08/16/2022 1420   ALT 30 08/16/2022 1420   ALKPHOS 46 08/16/2022 1420   BILITOT 0.8 08/16/2022 1420   GFRNONAA >60 08/16/2022 1420   GFRAA 82 10/31/2018 0950   Lab Results  Component Value Date   CHOL 203 (H) 09/25/2019   HDL 59 09/25/2019   LDLCALC 126 (H) 09/25/2019   TRIG 100 09/25/2019   CHOLHDL 3.4 09/25/2019     ASSESSMENT AND PLAN 82 y.o. year old male  has a past medical history of Acid reflux, BPH (benign prostatic hyperplasia), Essential  hypertension (09/10/2013), Eye problem, Hard of hearing, Hiatal hernia, and High cholesterol. here with:  OSA on CPAP  - CPAP compliance excellent -Residual AHI slightly elevated most likely due to mask leakage.   2.  Alzheimer's disease  - MMSE stable 13/30 previously 16/30 -Continue Aricept 5 mg at bedtime  - Continue Namenda 10 mg BID   Follow-up in 1 year  or sooner if needed    Butch Penny, MSN, NP-C 01/12/2023, 11:03 AM [facility] [address] [place], [place] [address] [phone]

## 2023-01-17 DIAGNOSIS — I10 Essential (primary) hypertension: Secondary | ICD-10-CM | POA: Diagnosis not present

## 2023-01-17 DIAGNOSIS — Z0000 Encounter for general adult medical examination without abnormal findings: Secondary | ICD-10-CM | POA: Diagnosis not present

## 2023-01-17 DIAGNOSIS — G4733 Obstructive sleep apnea (adult) (pediatric): Secondary | ICD-10-CM | POA: Diagnosis not present

## 2023-01-17 DIAGNOSIS — H9193 Unspecified hearing loss, bilateral: Secondary | ICD-10-CM | POA: Diagnosis not present

## 2023-01-17 DIAGNOSIS — R82998 Other abnormal findings in urine: Secondary | ICD-10-CM | POA: Diagnosis not present

## 2023-01-17 DIAGNOSIS — I2510 Atherosclerotic heart disease of native coronary artery without angina pectoris: Secondary | ICD-10-CM | POA: Diagnosis not present

## 2023-01-17 DIAGNOSIS — E785 Hyperlipidemia, unspecified: Secondary | ICD-10-CM | POA: Diagnosis not present

## 2023-01-17 DIAGNOSIS — R42 Dizziness and giddiness: Secondary | ICD-10-CM | POA: Diagnosis not present

## 2023-01-17 DIAGNOSIS — G301 Alzheimer's disease with late onset: Secondary | ICD-10-CM | POA: Diagnosis not present

## 2023-01-17 DIAGNOSIS — F0280 Dementia in other diseases classified elsewhere without behavioral disturbance: Secondary | ICD-10-CM | POA: Diagnosis not present

## 2023-01-25 ENCOUNTER — Ambulatory Visit: Payer: Medicare Other | Attending: Cardiology | Admitting: Cardiology

## 2023-01-25 ENCOUNTER — Encounter: Payer: Self-pay | Admitting: Cardiology

## 2023-01-25 VITALS — BP 114/54 | HR 52 | Ht 70.0 in | Wt 204.0 lb

## 2023-01-25 DIAGNOSIS — E7800 Pure hypercholesterolemia, unspecified: Secondary | ICD-10-CM | POA: Diagnosis not present

## 2023-01-25 DIAGNOSIS — I2510 Atherosclerotic heart disease of native coronary artery without angina pectoris: Secondary | ICD-10-CM | POA: Diagnosis not present

## 2023-01-25 DIAGNOSIS — I10 Essential (primary) hypertension: Secondary | ICD-10-CM | POA: Insufficient documentation

## 2023-01-25 NOTE — Patient Instructions (Signed)
Medication Instructions:  The current medical regimen is effective;  continue present plan and medications.  *If you need a refill on your cardiac medications before your next appointment, please call your pharmacy*   Follow-Up: At [facility], you and your health needs are our priority.  As part of our continuing mission to provide you with exceptional heart care, we have created designated Provider Care Teams.  These Care Teams include your primary Cardiologist (physician) and Advanced Practice Providers (APPs -  Physician Assistants and Nurse Practitioners) who all work together to provide you with the care you need, when you need it.  We recommend signing up for the patient portal called "MyChart".  Sign up information is provided on this After Visit Summary.  MyChart is used to connect with patients for Virtual Visits (Telemedicine).  Patients are able to view lab/test results, encounter notes, upcoming appointments, etc.  Non-urgent messages can be sent to your provider as well.   To learn more about what you can do with MyChart, go to [email or web address].    Your next appointment:   1 year(s)  Provider:   Dr Mark Skains      

## 2023-01-25 NOTE — Progress Notes (Signed)
Cardiology Office Note:    Date:  01/25/2023   [name], DOB Jul 26, 1941, MRN [id number]  PCP:  Garlan Fillers, MD   [facility] Providers Cardiologist:  Donato Schultz, MD     Referring MD: Garlan Fillers, MD    History of Present Illness:    [name] is a 82 y.o. male here for the evaluation of coronary artery disease at the request of Dr. Ivery Quale.  Previously seen by Dr. Lorne Skeens on 05/19/2020.  Has a history of chest pain moderate nonobstructive CAD on coronary CT in 2015.  Statins were previously stopped because of memory issues, but he is now back on Crestor 20 mg excellent for coronary plaque stabilization.  On both Aricept and Namenda  Seems to doing well without any chest discomfort.  No significant shortness of breath.  He had occasional heart rate on the monitor 38 bpm during sleep time.  1 episode of SVT.  Brief  Overall doing well.  Keeps busy.  His wife June is with him.  Helps with historical accounts.  They have a trainer come in 2 days a week.  Excellent.      Past Medical History:  Diagnosis Date   Acid reflux    a. well-controlled with ranitidine.   BPH (benign prostatic hyperplasia)    a. s/p TURP 2012.   Colon polyps    COVID-19    Dizziness    Edema leg    Essential hypertension 09/10/2013   Eye problem    a. L eye blurred vision - seeing eye doctor for tissue pulling away from eye.   Hard of hearing    Hiatal hernia    a. Small-mod by CXR 2015.   High cholesterol    OSA (obstructive sleep apnea)    Prostatitis    Shoulder pain     Past Surgical History:  Procedure Laterality Date   CORONARY ANGIOPLASTY     EYE SURGERY     PARS PLANA VITRECTOMY Left 08/29/2013   WITH MEMBRANE PEEL, 23 GAUGE   TRANSURETHRAL RESECTION OF PROSTATE     TRANSURETHRAL RESECTION OF PROSTATE     Urethral stricture surgery     VASTECTOMY      Current Medications: Current Meds  Medication Sig   acetaminophen  (TYLENOL) 500 MG tablet Take 1,000 mg by mouth every 6 (six) hours as needed for mild pain or moderate pain.   aspirin 81 MG tablet Take 81 mg by mouth daily.   bifidobacterium infantis (ALIGN) capsule Take 1 capsule by mouth daily.   Calcium Carbonate-Vitamin D (CALTRATE 600+D PO) Take 1 tablet by mouth daily.   desmopressin (DDAVP) 0.2 MG tablet Take 0.4 mg by mouth at bedtime.    donepezil (ARICEPT) 5 MG tablet TAKE 1 TABLET BY MOUTH EVERYDAY AT BEDTIME   FERREX 150 150 MG capsule Take 150 mg by mouth daily.   gabapentin (NEURONTIN) 100 MG capsule TAKE 1 CAPSULE BY MOUTH EVERYDAY AT BEDTIME   meclizine (ANTIVERT) 25 MG tablet Take 25 mg by mouth daily as needed.   memantine (NAMENDA) 10 MG tablet TAKE 1 TABLET BY MOUTH TWICE A DAY   Multiple Vitamins-Minerals (CENTRUM SILVER PO) Take 1 tablet by mouth daily.   ondansetron (ZOFRAN) 4 MG tablet Take 1 tablet (4 mg total) by mouth every 6 (six) hours.   Polyethyl Glycol-Propyl Glycol (SYSTANE OP) Apply 2 drops to eye daily as needed (Dry eyes). 2 drops each eye  rOPINIRole (REQUIP) 1 MG tablet Take 2 tablets by mouth at bedtime.    rosuvastatin (CRESTOR) 20 MG tablet Take 20 mg by mouth daily.     Allergies:   Sulfa antibiotics, Sulfamethoxazole, and Sulfasalazine   Social History   Socioeconomic History   Marital status: Married    Spouse name: June   Number of children: Not on file   Years of education: Not on file   Highest education level: Not on file  Occupational History   Not on file  Tobacco Use   Smoking status: Never   Smokeless tobacco: Never  Vaping Use   Vaping Use: Never used  Substance and Sexual Activity   Alcohol use: Yes    Comment: Once per month   Drug use: No   Sexual activity: Not on file  Other Topics Concern   Not on file  Social History Narrative   Not on file   Social Determinants of Health   Financial Resource Strain: Not on file  Food Insecurity: Not on file  Transportation Needs: Not on  file  Physical Activity: Not on file  Stress: Not on file  Social Connections: Not on file     Family History: The patient's family history includes Dementia in his brother; Other in his mother; Sleep apnea in his son; Stroke in his brother and father; Tuberculosis in his paternal grandfather. There is no history of CAD.  ROS:   Please see the history of present illness.     All other systems reviewed and are negative.  EKGs/Labs/Other Studies Reviewed:    The following studies were reviewed today: Cardiac Studies & Procedures   CARDIAC CATHETERIZATION  CARDIAC CATHETERIZATION 03/15/2019     ECHOCARDIOGRAM  ECHOCARDIOGRAM COMPLETE 11/02/2018  Narrative ECHOCARDIOGRAM REPORT    Patient Name:   [name] Date of Exam: 11/02/2018 Medical Rec #:  [id number]     Height:       70.0 in Accession #:    [id number]    Weight:       195.0 lb Date of Birth:  1940-11-22     BSA:          2.06 m Patient Age:    77 years      BP:           120/75 mmHg Patient Gender: M             HR:           53 bpm. Exam Location:  [address]   Procedure: 2D Echo, Cardiac Doppler, Color Doppler and Intracardiac Opacification Agent  Indications:    R07.09 Chest pain  History:        Patient has no prior history of Echocardiogram examinations. Signs/Symptoms: Chest Pain; Risk Factors: Hypertension and Dyslipidemia. Moderate nonobstructive CAD by CT 2015.  Sonographer:    Chanetta Marshall BA, RDCS Referring Phys: [id number] Tarri Abernethy [facility]  IMPRESSIONS   1. The left ventricle has hyperdynamic systolic function, with an ejection fraction of >65%. The cavity size was normal. There is mildly increased left ventricular wall thickness. Left ventricular diastolic Doppler parameters are consistent with impaired relaxation. 2. The right ventricle has normal systolic function. The cavity was normal. There is no increase in right ventricular wall thickness. 3. The mitral valve is normal in structure. 4.  The tricuspid valve is normal in structure. 5. The aortic valve is tricuspid.  FINDINGS Left Ventricle: The left ventricle has hyperdynamic systolic function, with an ejection fraction of >  65%. The cavity size was normal. There is mildly increased left ventricular wall thickness. Left ventricular diastolic Doppler parameters are consistent with impaired relaxation Definity contrast agent was given IV to delineate the left ventricular endocardial borders. Right Ventricle: The right ventricle has normal systolic function. The cavity was normal. There is no increase in right ventricular wall thickness. Left Atrium: left atrial size was normal in size Right Atrium: right atrial size was normal in size. Right atrial pressure is estimated at 3 mmHg. Interatrial Septum: No atrial level shunt detected by color flow Doppler. Pericardium: There is no evidence of pericardial effusion. Mitral Valve: The mitral valve is normal in structure. Mitral valve regurgitation is trivial by color flow Doppler. Tricuspid Valve: The tricuspid valve is normal in structure. Tricuspid valve regurgitation is trivial by color flow Doppler. Aortic Valve: The aortic valve is tricuspid Aortic valve regurgitation was not visualized by color flow Doppler. Pulmonic Valve: The pulmonic valve was grossly normal. Pulmonic valve regurgitation is trivial by color flow Doppler.  LEFT VENTRICLE PLAX 2D (Teich) LV EF:          71.6 %   Diastology LVIDd:          3.46 cm  LV e' lateral:   8.27 cm/s LVIDs:          2.08 cm  LV E/e' lateral: 12.9 LV PW:          1.20 cm  LV e' medial:    6.64 cm/s LV IVS:         1.24 cm  LV E/e' medial:  16.1 LVOT diam:      2.20 cm LV SV:          35 ml LVOT Area:      3.80 cm  RIGHT VENTRICLE RV Basal diam:  3.60 cm RV S prime:     12.90 cm/s TAPSE (M-mode): 2.5 cm RVSP:           24.3 mmHg  LEFT ATRIUM         Index      RIGHT ATRIUM LA diam:    3.70 cm 1.79 cm/m RA Pressure: 3  mmHg AORTIC VALVE LVOT Vmax:   103.00 cm/s LVOT Vmean:  69.600 cm/s LVOT VTI:    0.240 m  AORTA Ao Root diam: 3.00 cm Ao Asc diam:  3.40 cm  MITRAL VALVE               TRICUSPID VALVE TR Peak grad:   21.3 mmHg TR Vmax:        231.00 cm/s MV Decel Time: 236 msec    RVSP:           24.3 mmHg MV E velocity: 107.00 cm/s MV A velocity: 94.90 cm/s MV E/A ratio:  1.13  IVC IVC diam: 2.09 cm   Dietrich Pates MD Electronically signed by Dietrich Pates MD Signature Date/Time: 11/02/2018/4:43:21 PM    Final    MONITORS  CARDIAC EVENT MONITOR 08/26/2019  Narrative  Sinus bradycardia to sinus rhythm.  Minimal heart rate 38 bpm, average heart rate 49 bpm.  No AV blocks, no pauses lasting more than 3 seconds, no arrhythmias.  Average heart rate 49 bpm, no AV blocks or pauses lasting more than 3 seconds.   CT SCANS  CT CORONARY MORPH W/CTA COR W/SCORE 09/05/2013  Addendum 09/05/2013  5:51 PM ADDENDUM REPORT: 09/05/2013 17:49  CLINICAL DATA:  Chest pain  EXAM: Cardiac CT  TECHNIQUE: The patient was scanned on a  Philips [id number] scanner. A 100 kV prospective scan was triggered in the descending thoracic aorta at 111 HU's. Gantry rotation speed was 270 msecs and collimation was .9 mm. No beta blockade and 0.4 mg sl nitro was given. The 3D data set was reconstructed in 5% intervals of the 67-82 of the cardiac cycle. The patient received 80 cc of contrast.  FINDINGS: Coronary Arteries:  Originating in the normal position.  Left main has only minimal atherosclerotic plague. LM gives rise to LAD, small ramus intermedius and non-dominant LCX.  Ostial LAD has a mild mixed plague with 0-25% stenosis. LAD gives rise to a large diagonal branch (almost like dual LAD).  Proximal and mid LAD has a long moderate circumferential calcified plague with associated 25-50% stenosis. Distal LAD has only mild luminal irregularities.  First diagonal branch has calcified plague with associated  0-25% stenosis.  LCX is medium size non-dominant vessel. There is mild calcified plague in its mid segment associated with 0-25% stenosis.  RCA is a large dominant vessel. There is mild noncalcified plague in the mid RCA associated with 25-50% stenosis. Distal RCA has minimal calcified plague associated with <25% stenosis.  RCA gives rise to a large acute marginal branch that supplies PDA territory and a large PLVB. They both have only minimal calcified plague with associated 0-25% stenosis.  IMPRESSION: 1. Moderate diffuse non-obstructive CAD. Aggressive medical management is recommended.  Tobias Alexander   Electronically Signed By: Charlton Haws M.D. On: 09/05/2013 17:49  Narrative EXAM: OVER-READ INTERPRETATION  CT CHEST  The following report is an over-read performed by radiologist Dr. Royal Piedra [facility] on 09/05/2013. This over-read does not include interpretation of cardiac or coronary anatomy or pathology. The interpretation by the cardiologist is attached.  COMPARISON:  None.  FINDINGS: Multiple tiny pulmonary nodules scattered throughout the lung bases bilaterally, several which are calcified. Specific examples include the following: 3 mm nodule in the lateral segment of the right middle lobe (image 27 of series 5), 2 mm right lower lobe nodule (image 40 of series 5), 2 mm right lower lobe nodule (image 27 of series 5), and a 4 mm calcified granuloma in the medial aspect of the left lower lobe (image 31 of series 5). No other larger more suspicious appearing pulmonary nodules or masses are otherwise noted in the visualized portions of the thorax. No consolidative airspace disease, pneumothorax or pleural effusions in the visualized thorax. Large hiatal hernia. Visualized portions of the upper abdomen are unremarkable. There are no aggressive appearing lytic or blastic lesions noted in the visualized portions of the  skeleton.  IMPRESSION: 1. No acute incidental noncardiac findings to account for the patient's symptoms. 2. However, the patient does have a large hiatal hernia. 3. Multiple small pulmonary nodules scattered throughout the lung bases bilaterally, some of which are calcified. These are favored to represent benign disease, likely a mixture of calcified and noncalcified granulomas, however, follow-up studies may be appropriate. The largest noncalcified nodule is a 3 mm nodule in the lateral segment of the right middle lobe (image 27 of series 5). If the patient is at high risk for bronchogenic carcinoma, follow-up chest CT at 1year is recommended. If the patient is at low risk, no follow-up is needed. This recommendation follows the consensus statement: Guidelines for Management of Small Pulmonary Nodules Detected on CT Scans: A Statement from the Fleischner Society as published in Radiology 2005; 237:395-400.  Electronically Signed: By: Trudie Reed M.D. On: 09/05/2013 16:43  EKG: 01/25/2023-sinus bradycardia 54 with PAC Prior shows sinus bradycardia 52   Recent Labs: 08/16/2022: ALT 30; BUN 16; Creatinine, Ser 1.00; Hemoglobin 14.3; Magnesium 2.3; Platelets 220; Potassium 4.2; Sodium 139; TSH 1.186  Recent Lipid Panel    Component Value Date/Time   CHOL 203 (H) 09/25/2019 0906   TRIG 100 09/25/2019 0906   HDL 59 09/25/2019 0906   CHOLHDL 3.4 09/25/2019 0906   CHOLHDL 2 10/03/2013 0749   VLDL 14.4 10/03/2013 0749   LDLCALC 126 (H) 09/25/2019 0906     Risk Assessment/Calculations:               Physical Exam:    VS:  BP (!) 114/54   Pulse (!) 52   Ht 5\' 10"  (1.778 m)   Wt 204 lb (92.5 kg)   SpO2 95%   BMI 29.27 kg/m     Wt Readings from Last 3 Encounters:  01/25/23 204 lb (92.5 kg)  01/12/23 207 lb (93.9 kg)  01/11/22 206 lb 12.8 oz (93.8 kg)     GEN: Well nourished, well developed in no acute distress HEENT: Normal NECK: No JVD; No  carotid bruits LYMPHATICS: No lymphadenopathy CARDIAC: RRR, no murmurs, rubs, gallops RESPIRATORY:  Clear to auscultation without rales, wheezing or rhonchi  ABDOMEN: Soft, non-tender, non-distended MUSCULOSKELETAL:  No edema; No deformity  SKIN: Warm and dry NEUROLOGIC:  Alert and oriented x 3 PSYCHIATRIC:  Normal affect   ASSESSMENT:    1. Coronary artery disease involving native coronary artery of native heart without angina pectoris   2. Essential hypertension   3. Pure hypercholesterolemia    PLAN:    In order of problems listed above:  Nonobstructive coronary artery disease on CT 2015 - Continue with secondary risk factor prevention.  On Crestor 20 mg.  Excellent.  Prior LDL 68 in April 2023 from outside labs.  Continue with aspirin.  No myalgias no bleeding.  Bradycardia/SVT - Heart rate is stable asymptomatic.  No need for pacemaker.  Hyperlipidemia - Continue with Crestor 20 mg.  Excellent.  LDL 68 at goal less than 70 given coronary plaque.  Memory impairment - On both Aricept and Namenda.         Medication Adjustments/Labs and Tests Ordered: Current medicines are reviewed at length with the patient today.  Concerns regarding medicines are outlined above.  Orders Placed This Encounter  Procedures   EKG 12-Lead   No orders of the defined types were placed in this encounter.   Patient Instructions  Medication Instructions:  The current medical regimen is effective;  continue present plan and medications.  *If you need a refill on your cardiac medications before your next appointment, please call your pharmacy*  Follow-Up: At [facility], you and your health needs are our priority.  As part of our continuing mission to provide you with exceptional heart care, we have created designated Provider Care Teams.  These Care Teams include your primary Cardiologist (physician) and Advanced Practice Providers (APPs -  Physician Assistants and Nurse  Practitioners) who all work together to provide you with the care you need, when you need it.  We recommend signing up for the patient portal called "MyChart".  Sign up information is provided on this After Visit Summary.  MyChart is used to connect with patients for Virtual Visits (Telemedicine).  Patients are able to view lab/test results, encounter notes, upcoming appointments, etc.  Non-urgent messages can be sent to your provider as well.   To learn more  about what you can do with MyChart, go to [email or web address].    Your next appointment:   1 year(s)  Provider:   Dr Donato Schultz        Signed, Donato Schultz, MD  01/25/2023 11:39 AM    [facility]

## 2023-02-17 DIAGNOSIS — Z961 Presence of intraocular lens: Secondary | ICD-10-CM | POA: Diagnosis not present

## 2023-02-17 DIAGNOSIS — H35371 Puckering of macula, right eye: Secondary | ICD-10-CM | POA: Diagnosis not present

## 2023-03-27 DIAGNOSIS — N400 Enlarged prostate without lower urinary tract symptoms: Secondary | ICD-10-CM | POA: Diagnosis not present

## 2023-05-02 DIAGNOSIS — M545 Low back pain, unspecified: Secondary | ICD-10-CM | POA: Diagnosis not present

## 2023-05-02 DIAGNOSIS — N390 Urinary tract infection, site not specified: Secondary | ICD-10-CM | POA: Diagnosis not present

## 2023-05-02 DIAGNOSIS — G894 Chronic pain syndrome: Secondary | ICD-10-CM | POA: Diagnosis not present

## 2023-06-17 DIAGNOSIS — Z23 Encounter for immunization: Secondary | ICD-10-CM | POA: Diagnosis not present

## 2023-06-28 ENCOUNTER — Encounter (HOSPITAL_COMMUNITY): Payer: Self-pay

## 2023-06-28 ENCOUNTER — Other Ambulatory Visit: Payer: Self-pay

## 2023-06-28 ENCOUNTER — Emergency Department (HOSPITAL_COMMUNITY): Payer: Medicare Other

## 2023-06-28 ENCOUNTER — Emergency Department (HOSPITAL_COMMUNITY)
Admission: EM | Admit: 2023-06-28 | Discharge: 2023-06-28 | Disposition: A | Discharge status: Home / Self Care | Payer: Medicare Other | Attending: Emergency Medicine | Admitting: Emergency Medicine

## 2023-06-28 DIAGNOSIS — R55 Syncope and collapse: Secondary | ICD-10-CM | POA: Diagnosis not present

## 2023-06-28 DIAGNOSIS — W01198A Fall on same level from slipping, tripping and stumbling with subsequent striking against other object, initial encounter: Secondary | ICD-10-CM | POA: Diagnosis not present

## 2023-06-28 DIAGNOSIS — I2510 Atherosclerotic heart disease of native coronary artery without angina pectoris: Secondary | ICD-10-CM | POA: Insufficient documentation

## 2023-06-28 DIAGNOSIS — S0181XA Laceration without foreign body of other part of head, initial encounter: Secondary | ICD-10-CM

## 2023-06-28 DIAGNOSIS — S0121XA Laceration without foreign body of nose, initial encounter: Secondary | ICD-10-CM | POA: Diagnosis not present

## 2023-06-28 DIAGNOSIS — Y9389 Activity, other specified: Secondary | ICD-10-CM | POA: Insufficient documentation

## 2023-06-28 DIAGNOSIS — S01511A Laceration without foreign body of lip, initial encounter: Secondary | ICD-10-CM | POA: Diagnosis not present

## 2023-06-28 DIAGNOSIS — Z79899 Other long term (current) drug therapy: Secondary | ICD-10-CM | POA: Diagnosis not present

## 2023-06-28 DIAGNOSIS — E876 Hypokalemia: Secondary | ICD-10-CM

## 2023-06-28 DIAGNOSIS — I10 Essential (primary) hypertension: Secondary | ICD-10-CM | POA: Diagnosis not present

## 2023-06-28 DIAGNOSIS — R001 Bradycardia, unspecified: Secondary | ICD-10-CM | POA: Insufficient documentation

## 2023-06-28 DIAGNOSIS — Z23 Encounter for immunization: Secondary | ICD-10-CM | POA: Diagnosis not present

## 2023-06-28 DIAGNOSIS — S0033XA Contusion of nose, initial encounter: Secondary | ICD-10-CM | POA: Diagnosis not present

## 2023-06-28 DIAGNOSIS — W19XXXA Unspecified fall, initial encounter: Secondary | ICD-10-CM

## 2023-06-28 DIAGNOSIS — S0992XA Unspecified injury of nose, initial encounter: Secondary | ICD-10-CM | POA: Diagnosis present

## 2023-06-28 DIAGNOSIS — S022XXA Fracture of nasal bones, initial encounter for closed fracture: Secondary | ICD-10-CM

## 2023-06-28 DIAGNOSIS — Z7982 Long term (current) use of aspirin: Secondary | ICD-10-CM | POA: Insufficient documentation

## 2023-06-28 DIAGNOSIS — R58 Hemorrhage, not elsewhere classified: Secondary | ICD-10-CM | POA: Diagnosis not present

## 2023-06-28 DIAGNOSIS — S01112A Laceration without foreign body of left eyelid and periocular area, initial encounter: Secondary | ICD-10-CM | POA: Diagnosis not present

## 2023-06-28 DIAGNOSIS — E8809 Other disorders of plasma-protein metabolism, not elsewhere classified: Secondary | ICD-10-CM | POA: Insufficient documentation

## 2023-06-28 DIAGNOSIS — Y92007 Garden or yard of unspecified non-institutional (private) residence as the place of occurrence of the external cause: Secondary | ICD-10-CM | POA: Diagnosis not present

## 2023-06-28 LAB — CBC WITH DIFFERENTIAL/PLATELET
Abs Immature Granulocytes: 0.03 10*3/uL (ref 0.00–0.07)
Basophils Absolute: 0.1 10*3/uL (ref 0.0–0.1)
Basophils Relative: 1 %
Eosinophils Absolute: 0.2 10*3/uL (ref 0.0–0.5)
Eosinophils Relative: 3 %
HCT: 41.8 % (ref 39.0–52.0)
Hemoglobin: 14.1 g/dL (ref 13.0–17.0)
Immature Granulocytes: 1 %
Lymphocytes Relative: 8 %
Lymphs Abs: 0.5 10*3/uL — ABNORMAL LOW (ref 0.7–4.0)
MCH: 32.3 pg (ref 26.0–34.0)
MCHC: 33.7 g/dL (ref 30.0–36.0)
MCV: 95.9 fL (ref 80.0–100.0)
Monocytes Absolute: 0.8 10*3/uL (ref 0.1–1.0)
Monocytes Relative: 14 %
Neutro Abs: 4.4 10*3/uL (ref 1.7–7.7)
Neutrophils Relative %: 73 %
Platelets: 207 10*3/uL (ref 150–400)
RBC: 4.36 MIL/uL (ref 4.22–5.81)
RDW: 12.7 % (ref 11.5–15.5)
WBC: 6 10*3/uL (ref 4.0–10.5)
nRBC: 0 % (ref 0.0–0.2)

## 2023-06-28 LAB — URINALYSIS, ROUTINE W REFLEX MICROSCOPIC
Bilirubin Urine: NEGATIVE
Glucose, UA: NEGATIVE mg/dL
Hgb urine dipstick: NEGATIVE
Ketones, ur: 5 mg/dL — AB
Leukocytes,Ua: NEGATIVE
Nitrite: NEGATIVE
Protein, ur: NEGATIVE mg/dL
Specific Gravity, Urine: 1.014 (ref 1.005–1.030)
pH: 5 (ref 5.0–8.0)

## 2023-06-28 LAB — COMPREHENSIVE METABOLIC PANEL
ALT: 20 U/L (ref 0–44)
AST: 20 U/L (ref 15–41)
Albumin: 2.4 g/dL — ABNORMAL LOW (ref 3.5–5.0)
Alkaline Phosphatase: 35 U/L — ABNORMAL LOW (ref 38–126)
Anion gap: 6 (ref 5–15)
BUN: 13 mg/dL (ref 8–23)
CO2: 20 mmol/L — ABNORMAL LOW (ref 22–32)
Calcium: 6.1 mg/dL — CL (ref 8.9–10.3)
Chloride: 116 mmol/L — ABNORMAL HIGH (ref 98–111)
Creatinine, Ser: 0.77 mg/dL (ref 0.61–1.24)
GFR, Estimated: >60 mL/min (ref >60)
Glucose, Bld: 87 mg/dL (ref 70–99)
Potassium: 3 mmol/L — ABNORMAL LOW (ref 3.5–5.1)
Sodium: 142 mmol/L (ref 135–145)
Total Bilirubin: 0.5 mg/dL (ref 0.3–1.2)
Total Protein: 4 g/dL — ABNORMAL LOW (ref 6.5–8.1)

## 2023-06-28 LAB — TROPONIN I (HIGH SENSITIVITY)
Troponin I (High Sensitivity): 6 ng/L (ref <18)
Troponin I (High Sensitivity): 13 ng/L (ref <18)

## 2023-06-28 LAB — TSH: TSH: 2.451 u[IU]/mL (ref 0.350–4.500)

## 2023-06-28 LAB — MAGNESIUM: Magnesium: 2.2 mg/dL (ref 1.7–2.4)

## 2023-06-28 MED ORDER — POTASSIUM CHLORIDE ER 10 MEQ PO TBCR: 10.0000 meq | EXTENDED_RELEASE_TABLET | Freq: Every day | ORAL | 0 refills | Status: DC

## 2023-06-28 MED ORDER — CALCIUM CARB-CHOLECALCIFEROL 500-5 MG-MCG PO TABS: 1.0000 | ORAL_TABLET | Freq: Two times a day (BID) | ORAL | 0 refills | Status: AC

## 2023-06-28 MED ORDER — ACETAMINOPHEN 500 MG PO TABS
1000.0000 mg | ORAL_TABLET | Freq: Once | ORAL | Status: AC
  Administered 2023-06-28: 1000 mg via ORAL
  Filled 2023-06-28: qty 2

## 2023-06-28 MED ORDER — CALCITRIOL 0.25 MCG PO CAPS
0.2500 ug | ORAL_CAPSULE | Freq: Every day | ORAL | Status: DC
  Administered 2023-06-28: 0.25 ug via ORAL
  Filled 2023-06-28: qty 1

## 2023-06-28 MED ORDER — TETANUS-DIPHTH-ACELL PERTUSSIS 5-2.5-18.5 LF-MCG/0.5 IM SUSY
0.5000 mL | PREFILLED_SYRINGE | Freq: Once | INTRAMUSCULAR | Status: AC
  Administered 2023-06-28: 0.5 mL via INTRAMUSCULAR
  Filled 2023-06-28: qty 0.5

## 2023-06-28 MED ORDER — POTASSIUM CHLORIDE CRYS ER 20 MEQ PO TBCR
40.0000 meq | EXTENDED_RELEASE_TABLET | Freq: Once | ORAL | Status: AC
Start: 2023-06-28 — End: 2023-06-28
  Administered 2023-06-28: 40 meq via ORAL
  Filled 2023-06-28: qty 2

## 2023-06-28 MED ORDER — LIDOCAINE HCL (PF) 1 % IJ SOLN
30.0000 mL | Freq: Once | INTRAMUSCULAR | Status: AC
  Administered 2023-06-28: 30 mL
  Filled 2023-06-28: qty 30

## 2023-06-28 MED ORDER — CALCIUM GLUCONATE-NACL 1-0.675 GM/50ML-% IV SOLN
1.0000 g | Freq: Once | INTRAVENOUS | Status: AC
  Administered 2023-06-28: 1000 mg via INTRAVENOUS
  Filled 2023-06-28: qty 50

## 2023-06-28 NOTE — ED Provider Notes (Signed)
[name] Provider Note   CSN: [id number] Arrival date & time: 06/28/23  1741     History  Chief Complaint  Patient presents with   [name] is a 82 y.o. male not on any blood thinners with history of hypertension, CAD, presents with concern for a fall earlier today.  Patient unable to recall how he fell.  However, according to wife, patient was doing yard work when he stumbled and fell forward.  Wife reports a brief loss of consciousness.  Patient with concern for bleeding and laceration on his face.  Denies any chest pain, shortness of breath, dizziness, changes in vision, nausea or vomiting.   Fall       Home Medications Prior to Admission medications   Medication Sig Start Date End Date Taking? Authorizing Provider  Calcium Carb-Cholecalciferol (CALCIUM 500 + D) 500-5 MG-MCG TABS Take 1 tablet by mouth 2 (two) times daily for 14 days. 06/28/23 07/12/23 Yes Arabella Merles, PA-C  potassium chloride (KLOR-CON) 10 MEQ tablet Take 1 tablet (10 mEq total) by mouth daily for 7 days. 06/28/23 07/05/23 Yes Arabella Merles, PA-C  acetaminophen (TYLENOL) 500 MG tablet Take 1,000 mg by mouth every 6 (six) hours as needed for mild pain or moderate pain.    [provider]  aspirin 81 MG tablet Take 81 mg by mouth daily.    [provider]  bifidobacterium infantis (ALIGN) capsule Take 1 capsule by mouth daily.    [provider]  desmopressin (DDAVP) 0.2 MG tablet Take 0.4 mg by mouth at bedtime.  09/20/17   [provider]  donepezil (ARICEPT) 5 MG tablet TAKE 1 TABLET BY MOUTH EVERYDAY AT BEDTIME 06/06/22   Dohmeier, Porfirio Mylar, MD  FERREX 150 150 MG capsule Take 150 mg by mouth daily. 07/30/18   [provider]  gabapentin (NEURONTIN) 100 MG capsule TAKE 1 CAPSULE BY MOUTH EVERYDAY AT BEDTIME 09/03/17   [provider]  meclizine (ANTIVERT) 25 MG tablet Take 25 mg by mouth daily  as needed. 06/05/13   [provider]  memantine (NAMENDA) 10 MG tablet TAKE 1 TABLET BY MOUTH TWICE A DAY 09/05/22   Butch Penny, NP  Multiple Vitamins-Minerals (CENTRUM SILVER PO) Take 1 tablet by mouth daily.    [provider]  ondansetron (ZOFRAN) 4 MG tablet Take 1 tablet (4 mg total) by mouth every 6 (six) hours. 08/16/22   Wynetta Fines, MD  Polyethyl Glycol-Propyl Glycol (SYSTANE OP) Apply 2 drops to eye daily as needed (Dry eyes). 2 drops each eye    [provider]  rOPINIRole (REQUIP) 1 MG tablet Take 2 tablets by mouth at bedtime.  10/22/18   [provider]  rosuvastatin (CRESTOR) 20 MG tablet Take 20 mg by mouth daily. 12/04/20   [provider]      Allergies    Sulfa antibiotics, Sulfamethoxazole, and Sulfasalazine    Review of Systems   Review of Systems  Neurological:  Negative for dizziness.    Physical Exam Updated Vital Signs BP 135/67   Pulse (!) 52   Temp (!) 97 F (36.1 C) (Oral)   Resp (!) 27   Ht 5\' 10"  (1.778 m)   Wt 92.5 kg   SpO2 99%   BMI 29.26 kg/m  Physical Exam Vitals and nursing note reviewed.  Constitutional:      General: He is not in acute distress.    Appearance: He is  well-developed.  HENT:     Head: Normocephalic.     Comments: No tenderness palpation of the skull diffusely, no step-offs noted  3 cm laceration above the left eyebrow, 1 cm laceration medial to the left eyebrow.  No foreign debris noted  Nose with edema, tender to palpation    Mouth/Throat:     Comments: 1 cm laceration to the upper inner lip Eyes:     Extraocular Movements: Extraocular movements intact.     Conjunctiva/sclera: Conjunctivae normal.     Pupils: Pupils are equal, round, and reactive to light.  Cardiovascular:     Rate and Rhythm: Regular rhythm. Bradycardia present.     Heart sounds: No murmur heard. Pulmonary:     Effort: Pulmonary effort is normal. No respiratory distress.     Breath sounds: Normal  breath sounds.  Abdominal:     Palpations: Abdomen is soft.     Tenderness: There is no abdominal tenderness.  Musculoskeletal:        General: No swelling.     Cervical back: Normal range of motion and neck supple.     Comments: No obvious deformities, erythema, edema of the upper and lower extremities bilaterally No tenderness palpation of the upper and lower extremities bilaterally  Able to flex and extend at the elbows bilaterally, wrist bilaterally, knees bilaterally, ankles bilaterally  Skin:    General: Skin is warm and dry.     Capillary Refill: Capillary refill takes less than 2 seconds.  Neurological:     Mental Status: He is alert.  Psychiatric:        Mood and Affect: Mood normal.     ED Results / Procedures / Treatments   Labs (all labs ordered are listed, but only abnormal results are displayed) Labs Reviewed  CBC WITH DIFFERENTIAL/PLATELET - Abnormal; Notable for the following components:      Result Value   Lymphs Abs 0.5 (*)    All other components within normal limits  URINALYSIS, ROUTINE W REFLEX MICROSCOPIC - Abnormal; Notable for the following components:   Ketones, ur 5 (*)    All other components within normal limits  COMPREHENSIVE METABOLIC PANEL - Abnormal; Notable for the following components:   Potassium 3.0 (*)    Chloride 116 (*)    CO2 20 (*)    Calcium 6.1 (*)    Total Protein 4.0 (*)    Albumin 2.4 (*)    Alkaline Phosphatase 35 (*)    All other components within normal limits  MAGNESIUM  TSH  PARATHYROID HORMONE, INTACT (NO CA)  CALCITRIOL (1,25 DI-OH VIT D)  TROPONIN I (HIGH SENSITIVITY)  TROPONIN I (HIGH SENSITIVITY)    EKG EKG Interpretation Date/Time:  Wednesday June 28 2023 18:05:48 EDT Ventricular Rate:  55 PR Interval:  205 QRS Duration:  96 QT Interval:  462 QTC Calculation: 442 R Axis:   23  Text Interpretation: Sinus rhythm Abnormal R-wave progression, early transition Baseline wander in lead(s) V1 Confirmed by  Vonita Moss [phone]) on 06/28/2023 6:08:38 PM  Radiology CT Head Wo Contrast  Result Date: 06/28/2023 CLINICAL DATA:  Head trauma, moderate-severe; Neck trauma (Age >= 65y); Facial trauma, blunt EXAM: CT HEAD WITHOUT CONTRAST CT MAXILLOFACIAL WITHOUT CONTRAST CT CERVICAL SPINE WITHOUT CONTRAST TECHNIQUE: Multidetector CT imaging of the head, cervical spine, and maxillofacial structures were performed using the standard protocol without intravenous contrast. Multiplanar CT image reconstructions of the cervical spine and maxillofacial structures were also generated. RADIATION DOSE REDUCTION: This exam was performed  according to the departmental dose-optimization program which includes automated exposure control, adjustment of the mA and/or kV according to patient size and/or use of iterative reconstruction technique. COMPARISON:  None Available. FINDINGS: CT HEAD FINDINGS Brain: No evidence of acute infarction, hemorrhage, hydrocephalus, extra-axial collection or mass lesion/mass effect. Patchy white matter hypodensities, compatible with chronic microvascular ischemic disease. Vascular: No hyperdense vessel identified. Calcific atherosclerosis. Skull: No acute calvarial fracture. Left forehead mild contusion/laceration. Other: No mastoid effusions. CT MAXILLOFACIAL FINDINGS Osseous: Mildly displaced nasal bone fractures. No other fracture identified. TMJs are located. Orbits: Negative. No traumatic or inflammatory finding. Sinuses: Clear. Soft tissues: Left forehead/nasal contusion. CT CERVICAL SPINE FINDINGS Alignment: No substantial sagittal subluxation. Skull base and vertebrae: No acute fracture. Soft tissues and spinal canal: No prevertebral fluid or swelling. No visible canal hematoma. Disc levels:  Mild for age multilevel bony degenerative change. Upper chest: Visualized lung apices are clear. IMPRESSION: 1. No evidence of acute abnormality intracranially or in the cervical spine. 2. Left  forehead/nasal contusion with mildly displaced nasal bone fractures. Electronically Signed   By: Feliberto Harts M.D.   On: 06/28/2023 19:43   CT Cervical Spine Wo Contrast  Result Date: 06/28/2023 CLINICAL DATA:  Head trauma, moderate-severe; Neck trauma (Age >= 65y); Facial trauma, blunt EXAM: CT HEAD WITHOUT CONTRAST CT MAXILLOFACIAL WITHOUT CONTRAST CT CERVICAL SPINE WITHOUT CONTRAST TECHNIQUE: Multidetector CT imaging of the head, cervical spine, and maxillofacial structures were performed using the standard protocol without intravenous contrast. Multiplanar CT image reconstructions of the cervical spine and maxillofacial structures were also generated. RADIATION DOSE REDUCTION: This exam was performed according to the departmental dose-optimization program which includes automated exposure control, adjustment of the mA and/or kV according to patient size and/or use of iterative reconstruction technique. COMPARISON:  None Available. FINDINGS: CT HEAD FINDINGS Brain: No evidence of acute infarction, hemorrhage, hydrocephalus, extra-axial collection or mass lesion/mass effect. Patchy white matter hypodensities, compatible with chronic microvascular ischemic disease. Vascular: No hyperdense vessel identified. Calcific atherosclerosis. Skull: No acute calvarial fracture. Left forehead mild contusion/laceration. Other: No mastoid effusions. CT MAXILLOFACIAL FINDINGS Osseous: Mildly displaced nasal bone fractures. No other fracture identified. TMJs are located. Orbits: Negative. No traumatic or inflammatory finding. Sinuses: Clear. Soft tissues: Left forehead/nasal contusion. CT CERVICAL SPINE FINDINGS Alignment: No substantial sagittal subluxation. Skull base and vertebrae: No acute fracture. Soft tissues and spinal canal: No prevertebral fluid or swelling. No visible canal hematoma. Disc levels:  Mild for age multilevel bony degenerative change. Upper chest: Visualized lung apices are clear. IMPRESSION: 1.  No evidence of acute abnormality intracranially or in the cervical spine. 2. Left forehead/nasal contusion with mildly displaced nasal bone fractures. Electronically Signed   By: Feliberto Harts M.D.   On: 06/28/2023 19:43   CT Maxillofacial Wo Contrast  Result Date: 06/28/2023 CLINICAL DATA:  Head trauma, moderate-severe; Neck trauma (Age >= 65y); Facial trauma, blunt EXAM: CT HEAD WITHOUT CONTRAST CT MAXILLOFACIAL WITHOUT CONTRAST CT CERVICAL SPINE WITHOUT CONTRAST TECHNIQUE: Multidetector CT imaging of the head, cervical spine, and maxillofacial structures were performed using the standard protocol without intravenous contrast. Multiplanar CT image reconstructions of the cervical spine and maxillofacial structures were also generated. RADIATION DOSE REDUCTION: This exam was performed according to the departmental dose-optimization program which includes automated exposure control, adjustment of the mA and/or kV according to patient size and/or use of iterative reconstruction technique. COMPARISON:  None Available. FINDINGS: CT HEAD FINDINGS Brain: No evidence of acute infarction, hemorrhage, hydrocephalus, extra-axial collection or mass lesion/mass effect. Patchy white  matter hypodensities, compatible with chronic microvascular ischemic disease. Vascular: No hyperdense vessel identified. Calcific atherosclerosis. Skull: No acute calvarial fracture. Left forehead mild contusion/laceration. Other: No mastoid effusions. CT MAXILLOFACIAL FINDINGS Osseous: Mildly displaced nasal bone fractures. No other fracture identified. TMJs are located. Orbits: Negative. No traumatic or inflammatory finding. Sinuses: Clear. Soft tissues: Left forehead/nasal contusion. CT CERVICAL SPINE FINDINGS Alignment: No substantial sagittal subluxation. Skull base and vertebrae: No acute fracture. Soft tissues and spinal canal: No prevertebral fluid or swelling. No visible canal hematoma. Disc levels:  Mild for age multilevel bony  degenerative change. Upper chest: Visualized lung apices are clear. IMPRESSION: 1. No evidence of acute abnormality intracranially or in the cervical spine. 2. Left forehead/nasal contusion with mildly displaced nasal bone fractures. Electronically Signed   By: Feliberto Harts M.D.   On: 06/28/2023 19:43    Procedures .Marland KitchenLaceration Repair  Date/Time: 06/28/2023 8:12 PM  Performed by: Arabella Merles, PA-C Authorized by: Arabella Merles, PA-C   Consent:    Consent obtained:  Verbal   Consent given by:  Patient   Risks, benefits, and alternatives were discussed: yes     Risks discussed:  Infection, pain and poor cosmetic result   Alternatives discussed:  No treatment Universal protocol:    Procedure explained and questions answered to patient or proxy's satisfaction: yes     Test results available: yes     Imaging studies available: yes     Patient identity confirmed:  Verbally with patient Anesthesia:    Anesthesia method:  Local infiltration   Local anesthetic:  Lidocaine 1% w/o epi (4ml) Laceration details:    Location: Above left eyebrow.   Length (cm):  3   Depth (mm):  4 Pre-procedure details:    Preparation:  Patient was prepped and draped in usual sterile fashion and imaging obtained to evaluate for foreign bodies Exploration:    Imaging outcome: foreign body not noted     Wound exploration: wound explored through full range of motion and entire depth of wound visualized     Contaminated: no   Treatment:    Area cleansed with:  Chlorhexidine   Amount of cleaning:  Standard   Irrigation solution:  Sterile saline   Irrigation volume:  1L   Irrigation method:  Syringe   Debridement:  Minimal   Undermining:  None   Layers/structures repaired:  Deep dermal/superficial fascia Deep dermal/superficial fascia:    Suture size:  5-0   Suture material:  Vicryl   Suture technique:  Simple interrupted   Number of sutures:  3 Skin repair:    Repair method:  Sutures    Suture size:  5-0   Suture material:  Prolene   Suture technique:  Simple interrupted   Number of sutures:  6 Repair type:    Repair type:  Complex Post-procedure details:    Dressing:  Open (no dressing)   Procedure completion:  Tolerated well, no immediate complications .Marland KitchenLaceration Repair  Date/Time: 06/28/2023 8:14 PM  Performed by: Arabella Merles, PA-C Authorized by: Arabella Merles, PA-C   Consent:    Consent obtained:  Verbal   Consent given by:  Patient   Risks, benefits, and alternatives were discussed: yes     Risks discussed:  Infection and pain   Alternatives discussed:  No treatment Universal protocol:    Procedure explained and questions answered to patient or proxy's satisfaction: yes     Test results available: yes     Imaging studies available: yes  Patient identity confirmed:  Verbally with patient Anesthesia:    Anesthesia method:  Local infiltration   Local anesthetic:  Lidocaine 1% w/o epi (1ml) Laceration details:    Location: medial side left eyebrow.   Length (cm):  1   Depth (mm):  2 Exploration:    Imaging outcome: foreign body not noted     Wound exploration: wound explored through full range of motion and entire depth of wound visualized     Contaminated: no   Treatment:    Area cleansed with:  Chlorhexidine   Irrigation solution:  Sterile water   Irrigation method:  Syringe   Debridement:  Minimal   Undermining:  None Skin repair:    Repair method:  Sutures   Suture size:  5-0   Suture material:  Prolene   Suture technique:  Simple interrupted   Number of sutures:  1 Repair type:    Repair type:  Simple Post-procedure details:    Dressing:  Open (no dressing)   Procedure completion:  Tolerated well, no immediate complications .Critical Care  Performed by: Arabella Merles, PA-C Authorized by: Arabella Merles, PA-C   Critical care provider statement:    Critical care time (minutes):  32   Critical care time was  exclusive of:  Separately billable procedures and treating other patients   Critical care was necessary to treat or prevent imminent or life-threatening deterioration of the following conditions:  Endocrine crisis   Critical care was time spent personally by me on the following activities:  Development of treatment plan with patient or surrogate, discussions with consultants, evaluation of patient's response to treatment, examination of patient, obtaining history from patient or surrogate, ordering and performing treatments and interventions, ordering and review of radiographic studies, re-evaluation of patient's condition, review of old charts and ordering and review of laboratory studies Comments:     Patient had critical lab values with hypocalcemia at 6.1, required further workup and lab testing.  This required IV repletion.  Patient also required CT scan of head, maxillofacial, cervical spine and consult to determine management of nasal fracture     Medications Ordered in ED Medications  calcitRIOL (ROCALTROL) capsule 0.25 mcg (0.25 mcg Oral Given 06/28/23 2048)  Tdap (BOOSTRIX) injection 0.5 mL (0.5 mLs Intramuscular Given 06/28/23 1832)  lidocaine (PF) (XYLOCAINE) 1 % injection 30 mL (30 mLs Infiltration Given 06/28/23 1844)  acetaminophen (TYLENOL) tablet 1,000 mg (1,000 mg Oral Given 06/28/23 1827)  potassium chloride SA (KLOR-CON M) CR tablet 40 mEq (40 mEq Oral Given 06/28/23 2037)  calcium gluconate 1 g/ 50 mL sodium chloride IVPB (0 mg Intravenous Stopped 06/28/23 2042)    ED Course/ Medical Decision Making/ A&P                                 Medical Decision Making Amount and/or Complexity of Data Reviewed Labs: ordered. Radiology: ordered.  Risk OTC drugs. Prescription drug management.   Differential diagnosis includes but is not limited to mechanical fall, hypotension, arrhythmia, electrolyte abnormality, intracranial hemorrhage  ED Course:  Patient with fall  earlier today with obvious facial trauma, but alert and oriented and otherwise well-appearing.  Laceration above the left eyebrow, laceration to the inner lip.  Dr. Jarold Motto evaluated patient and recommended allowing the lip laceration to heal on its own.  The laceration to the left eyebrow was irrigated with sterile saline and closed with suture.  His tetanus was updated.  He had epistaxis upon arrival, but this resolved with direct pressure.  Nose appeared edematous and he reported pain in this area, CT maxillofacial revealed a nasal bone fracture which was mildly displaced.  Maxillofacial was consulted and they recommended follow-up with PCP and allowing it to heal on its own.  CT head and cervical spine unremarkable.  Patient with no tenderness palpation of the upper and lower extremities, able to move the upper and lower extremities without difficulty, no concern for fracture or dislocation of the upper and lower extremities at this time. Patient given 1000 mg Tylenol for pain The wife at bedside reports patient has been more unsteady for about the past year.  He is at his baseline currently per the wife. Patient had critical lab value of hypocalcemia at 6.1.  His albumin was also low at 2.4.  The corrected calcium was at 7.0.  He was given 1g IV calcium gluconate and vitamin D.  He was found to have hypokalemia at 3.0, was given 40 mEq potassium orally.  Magnesium within normal limits, TSH within normal limits.  Pending parathyroid hormone and calcitriol level.  Patient maintained on cardiac monitor and EKG obtained which both showed sinus bradycardia consistent with patient's baseline. Upon re-evaluation, patient well-appearing, reports pain is well-controlled.  Patient appropriate for discharge home at this time with close follow-up with PCP for suture removal and follow-up on his calcium, albumin, potassium in the parathyroid lab and calcitriol lab drawn here.  Impression: Nasal bone  fracture Laceration above left eyebrow Laceration of inner upper lip  Disposition:  The patient was discharged home with instructions to follow-up with PCP in 5 days. Return precautions given.  Lab Tests: I Ordered, and personally interpreted labs.  The pertinent results include:   CMP with hypocalcemia at 6.1, hypoalbuminemia 2.4, hypokalemia 3.0 CBC without leukocytosis Urinalysis unremarkable   Imaging Studies ordered: I ordered imaging studies including CT head, CT cervical spine, CT maxillofacial I independently visualized the imaging with scope of interpretation limited to determining acute life threatening conditions related to emergency care. Imaging showed no acute intracranial abnormalities, no intracranial hemorrhage. CT maxillofacial showed mildly displaced nasal bone fracture I agree with the radiologist interpretation   Cardiac Monitoring: / EKG: The patient was maintained on a cardiac monitor.  I personally viewed and interpreted the cardiac monitored which showed an underlying rhythm of: Sinus bradycardia, consistent with prior EKGs   Consultations Obtained: I requested consultation with maxillofacial,  and discussed lab and imaging findings as well as pertinent plan - they recommend: Following up with PCP  External records from outside source obtained and reviewed including EKG from 08/16/2022 showing sinus bradycardia             Final Clinical Impression(s) / ED Diagnoses Final diagnoses:  Closed fracture of nasal bone, initial encounter  Fall, initial encounter  Hypocalcemia  Hypokalemia  Facial laceration, initial encounter    Rx / DC Orders ED Discharge Orders          Ordered    Calcium Carb-Cholecalciferol (CALCIUM 500 + D) 500-5 MG-MCG TABS  2 times daily        06/28/23 2236    potassium chloride (KLOR-CON) 10 MEQ tablet  Daily        06/28/23 2236              Arabella Merles, PA-C 06/28/23 2325    Rondel Baton,  MD 06/29/23 1102

## 2023-06-28 NOTE — ED Notes (Signed)
Patient transported to CT 

## 2023-06-28 NOTE — ED Notes (Signed)
PT's head and face were cleaned and new bandaging placed to stop bleeding on forehead.Suture cart and supplies were placed at bedside.

## 2023-06-28 NOTE — Discharge Instructions (Addendum)
You had 7 sutures in total placed today above your left eyebrow.  You must get your sutures removed in 5 days. We recommend visiting your PCP or an urgent care for suture removal. However, you may also return back to the ER if you are unable to be seen by your PCP or at urgent care.   You may gently clean the area around your laceration as needed with soap and water. Place antibiotic ointment such as bacitracin or neosporin over your laceration after cleaning the area.  Keep the laceration covered with sterile gauze if you are doing an activity in which it may get dirty. You may pick these supplies up at any drugstore.  Do not submerge your laceration in water (no baths, swimming) until it is fully healed. You may shower.   You may take up to 1000mg  of tylenol every 6 hours as needed for pain.  Do not take more then 4g per day.  You may use up to 800mg  ibuprofen every 8 hours as needed for pain.  Do not exceed 2.4g of ibuprofen per day.  You had a very low calcium here today at 6.1 (normal is between 8.9 and 10.3).  You were given some calcium through the IV.  However, you need to continue on calcium supplementation at home.  I have prescribed a calcium and vitamin D supplement you need to take twice daily.  Please continue to eat a diet rich in calcium with food such as milk and cheese.   Your potassium was also low here today at 3.0 (normal between 3.5 and 5.1).  You were given a potassium supplement here today.  You have been prescribed a potassium supplement to take daily.  Please follow-up with your PCP in 5 days to get your sutures removed.  They need to also recheck your potassium and calcium levels.  Please have them review the parathyroid hormone lab obtained today and the vitamin D level with you as we did not get these back yet.  Your TSH which is the thyroid hormone was normal today.  Your CT scan of the head and neck revealed a fracture of your nose bone.  This should heal on its own.   Please continue to monitor this with your PCP.  You also had a low albumin which is a protein level.  Please have your PCP monitor this as well.  This can indicate malnutrition.  Which is the cardiac enzyme was normal here today.  Your EKG.  Be at your baseline.  Your urine today showed no signs of infection.  Return to the ER should you develop fever, chills, pus drainage from your wound, redness around your wound, any further loss of consciousness, feelings of dizziness, chest pain, shortness of breath, any other new or concerning symptoms.

## 2023-06-28 NOTE — ED Triage Notes (Signed)
PT was at home doing yardwork for about 15-20 min when his wife saw him stumble  and fall forward, striking his face on the asphalt.Brief loss of consciousness. Laceration above eyebrow, chin and inside of lip.PT is not on blood thinners and has some mild confusion at baseline.

## 2023-06-30 LAB — PARATHYROID HORMONE, INTACT (NO CA): PTH: 13 pg/mL — ABNORMAL LOW (ref 15–65)

## 2023-06-30 LAB — CALCITRIOL (1,25 DI-OH VIT D): Vit D, 1,25-Dihydroxy: 44.6 pg/mL (ref 24.8–81.5)

## 2023-07-07 DIAGNOSIS — S022XXA Fracture of nasal bones, initial encounter for closed fracture: Secondary | ICD-10-CM | POA: Diagnosis not present

## 2023-07-07 DIAGNOSIS — F0280 Dementia in other diseases classified elsewhere without behavioral disturbance: Secondary | ICD-10-CM | POA: Diagnosis not present

## 2023-07-07 DIAGNOSIS — Z4802 Encounter for removal of sutures: Secondary | ICD-10-CM | POA: Diagnosis not present

## 2023-07-07 DIAGNOSIS — E876 Hypokalemia: Secondary | ICD-10-CM | POA: Diagnosis not present

## 2023-07-07 DIAGNOSIS — R42 Dizziness and giddiness: Secondary | ICD-10-CM | POA: Diagnosis not present

## 2023-07-07 DIAGNOSIS — H8111 Benign paroxysmal vertigo, right ear: Secondary | ICD-10-CM | POA: Diagnosis not present

## 2023-07-07 DIAGNOSIS — H5203 Hypermetropia, bilateral: Secondary | ICD-10-CM | POA: Diagnosis not present

## 2023-07-07 DIAGNOSIS — G301 Alzheimer's disease with late onset: Secondary | ICD-10-CM | POA: Diagnosis not present

## 2023-07-07 DIAGNOSIS — H52223 Regular astigmatism, bilateral: Secondary | ICD-10-CM | POA: Diagnosis not present

## 2023-07-07 DIAGNOSIS — S01112A Laceration without foreign body of left eyelid and periocular area, initial encounter: Secondary | ICD-10-CM | POA: Diagnosis not present

## 2023-07-07 DIAGNOSIS — H524 Presbyopia: Secondary | ICD-10-CM | POA: Diagnosis not present

## 2023-07-07 DIAGNOSIS — W19XXXA Unspecified fall, initial encounter: Secondary | ICD-10-CM | POA: Diagnosis not present

## 2023-07-12 DIAGNOSIS — Z23 Encounter for immunization: Secondary | ICD-10-CM | POA: Diagnosis not present

## 2023-07-31 DIAGNOSIS — L570 Actinic keratosis: Secondary | ICD-10-CM | POA: Diagnosis not present

## 2023-07-31 DIAGNOSIS — L905 Scar conditions and fibrosis of skin: Secondary | ICD-10-CM | POA: Diagnosis not present

## 2023-07-31 DIAGNOSIS — L821 Other seborrheic keratosis: Secondary | ICD-10-CM | POA: Diagnosis not present

## 2023-07-31 DIAGNOSIS — D2372 Other benign neoplasm of skin of left lower limb, including hip: Secondary | ICD-10-CM | POA: Diagnosis not present

## 2023-07-31 DIAGNOSIS — L814 Other melanin hyperpigmentation: Secondary | ICD-10-CM | POA: Diagnosis not present

## 2023-07-31 DIAGNOSIS — Z85828 Personal history of other malignant neoplasm of skin: Secondary | ICD-10-CM | POA: Diagnosis not present

## 2023-08-19 ENCOUNTER — Other Ambulatory Visit: Payer: Self-pay | Admitting: Neurology

## 2023-08-21 NOTE — Telephone Encounter (Signed)
Rx refilled per last office visit note.

## 2023-08-29 ENCOUNTER — Other Ambulatory Visit: Payer: Self-pay | Admitting: Adult Health

## 2023-10-27 ENCOUNTER — Ambulatory Visit (HOSPITAL_COMMUNITY)
Admission: EM | Admit: 2023-10-27 | Discharge: 2023-10-27 | Disposition: A | Discharge status: Home / Self Care | Payer: Medicare Other | Attending: Family Medicine | Admitting: Family Medicine

## 2023-10-27 ENCOUNTER — Other Ambulatory Visit: Payer: Self-pay

## 2023-10-27 ENCOUNTER — Emergency Department (HOSPITAL_COMMUNITY)
Admission: EM | Admit: 2023-10-27 | Discharge: 2023-10-27 | Disposition: A | Discharge status: Home / Self Care | Payer: Medicare Other | Attending: Emergency Medicine | Admitting: Emergency Medicine

## 2023-10-27 ENCOUNTER — Emergency Department (HOSPITAL_COMMUNITY): Payer: Medicare Other

## 2023-10-27 ENCOUNTER — Encounter (HOSPITAL_COMMUNITY): Payer: Self-pay

## 2023-10-27 DIAGNOSIS — E782 Mixed hyperlipidemia: Secondary | ICD-10-CM

## 2023-10-27 DIAGNOSIS — R072 Precordial pain: Secondary | ICD-10-CM

## 2023-10-27 DIAGNOSIS — I2510 Atherosclerotic heart disease of native coronary artery without angina pectoris: Secondary | ICD-10-CM | POA: Diagnosis not present

## 2023-10-27 DIAGNOSIS — R079 Chest pain, unspecified: Secondary | ICD-10-CM

## 2023-10-27 DIAGNOSIS — R0789 Other chest pain: Secondary | ICD-10-CM | POA: Diagnosis not present

## 2023-10-27 DIAGNOSIS — I10 Essential (primary) hypertension: Secondary | ICD-10-CM | POA: Insufficient documentation

## 2023-10-27 DIAGNOSIS — K449 Diaphragmatic hernia without obstruction or gangrene: Secondary | ICD-10-CM | POA: Diagnosis not present

## 2023-10-27 HISTORY — DX: Atherosclerotic heart disease of native coronary artery without angina pectoris: I25.10

## 2023-10-27 HISTORY — DX: Other ill-defined heart diseases: I51.89

## 2023-10-27 HISTORY — DX: Bradycardia, unspecified: R00.1

## 2023-10-27 HISTORY — DX: Other nonspecific abnormal finding of lung field: R91.8

## 2023-10-27 LAB — BASIC METABOLIC PANEL
Anion gap: 10 (ref 5–15)
BUN: 14 mg/dL (ref 8–23)
CO2: 24 mmol/L (ref 22–32)
Calcium: 9.2 mg/dL (ref 8.9–10.3)
Chloride: 108 mmol/L (ref 98–111)
Creatinine, Ser: 0.97 mg/dL (ref 0.61–1.24)
GFR, Estimated: >60 mL/min (ref >60)
Glucose, Bld: 108 mg/dL — ABNORMAL HIGH (ref 70–99)
Potassium: 4 mmol/L (ref 3.5–5.1)
Sodium: 142 mmol/L (ref 135–145)

## 2023-10-27 LAB — CBC
HCT: 47.4 % (ref 39.0–52.0)
Hemoglobin: 15.6 g/dL (ref 13.0–17.0)
MCH: 31.8 pg (ref 26.0–34.0)
MCHC: 32.9 g/dL (ref 30.0–36.0)
MCV: 96.7 fL (ref 80.0–100.0)
Platelets: 246 10*3/uL (ref 150–400)
RBC: 4.9 MIL/uL (ref 4.22–5.81)
RDW: 12.5 % (ref 11.5–15.5)
WBC: 8.1 10*3/uL (ref 4.0–10.5)
nRBC: 0 % (ref 0.0–0.2)

## 2023-10-27 LAB — TROPONIN I (HIGH SENSITIVITY)
Troponin I (High Sensitivity): 3 ng/L (ref <18)
Troponin I (High Sensitivity): 5 ng/L (ref <18)

## 2023-10-27 MED ORDER — LIDOCAINE 5 % EX PTCH
1.0000 | MEDICATED_PATCH | CUTANEOUS | Status: DC
  Administered 2023-10-27: 1 via TRANSDERMAL
  Filled 2023-10-27: qty 1

## 2023-10-27 MED ORDER — NITROGLYCERIN 0.4 MG SL SUBL: 0.4000 mg | SUBLINGUAL_TABLET | SUBLINGUAL | 0 refills | Status: DC | PRN

## 2023-10-27 MED ORDER — NITROGLYCERIN 0.4 MG SL SUBL
0.4000 mg | SUBLINGUAL_TABLET | Freq: Once | SUBLINGUAL | Status: AC
  Administered 2023-10-27: 0.4 mg via SUBLINGUAL
  Filled 2023-10-27: qty 1

## 2023-10-27 MED ORDER — ASPIRIN 81 MG PO CHEW
243.0000 mg | CHEWABLE_TABLET | Freq: Once | ORAL | Status: AC
  Administered 2023-10-27: 243 mg via ORAL
  Filled 2023-10-27: qty 3

## 2023-10-27 NOTE — Discharge Instructions (Signed)
They will go to the ER.

## 2023-10-27 NOTE — ED Provider Notes (Signed)
 Seen briefly in triage.  About 2 hours ago he began having chest pain.  It was in his left anterior chest.  It is improved some at this time.  It is still going on.Marland Kitchen  He did have some shortness of breath at the time of the worst part of the chest pain.  No nausea vomiting or diaphoresis.  His wife is in attendance and helps to give history.  Vital signs are reassuring CV regular and rhythm without murmur Lungs are clear  EKG shows sinus bradycardia.  I have asked him to proceed to the emergency room by private vehicle for evaluation of his chest pain that we cannot provide in the urgent care setting.  They are agreeable and will go   Zenia Resides, MD 10/27/23 1438

## 2023-10-27 NOTE — ED Provider Notes (Addendum)
 [place] EMERGENCY DEPARTMENT AT [facility] Provider Note  Arrival date/time:10/27/2023 7:31 PM  HPI/ROS   [name] is a 83 y.o. male with PMH significant for CAD, HLD, HTN, OSA, GERD, who presents for chest pain.  History is provided by patient and wife.  Patient Dors a sudden onset of chest pain around noon today before he had anything to eat.  Felt like left-sided chest pain that was associated with some shortness of breath.  It somewhat improved spontaneously, however patient does still feel persistent chest pain that he describes as 6 out of 10.  Patient did take 1 baby aspirin this morning that he takes daily.  Patient has any nausea or emesis or abdominal pain.  Denies any history of heart attacks or PEs. Denies fevers or URI symptoms.  A complete ROS was performed with pertinent positives/negatives noted above.   ED Course and Medical Decision Making   I personally reviewed the patient's vitals.  Assessment/Plan: 83 year old patient presenting with left-sided chest pain.  On presentation, patient vitals are reassuring.  His EKG just shows sinus bradycardia without ischemic findings. Per chart review, patient had cardiac telemetry monitoring resulting on 08/26/2019 that showed predominance of sinus bradycardia. Patient did have cardiac catheterization in 2002 that was largely normal without significant coronary artery disease.  No subsequent catheterization since then.  Patient was given patient dosing of aspirin.  He is also given nitroglycerin with improvement in his chest pain.  EKG is nonischemic, troponins are negative x 2. CBC shows no leukocytosis or anemia BMP shows no metabolic management or AKI.  Given his concerning history and improvement in symptoms with nitroglycerin, cardiology was consulted. With his reassuring workup and negative troponins, cardiology agrees with plan to follow-up with outpatient cardiology closely.  Discussed this with  patient and family and they voiced agreement and understanding.   Disposition:  I discussed the plan for discharge with the patient and/or their surrogate at bedside prior to discharge and they were in agreement with the plan and verbalized understanding of the return precautions provided. All questions answered to the best of my ability. Ultimately, the patient was discharged in stable condition with stable vital signs. I am reassured that they are capable of close follow up and good social support at home.   Clinical Impression:  1. Chest pain, unspecified type     Rx / DC Orders ED Discharge Orders          Ordered    Ambulatory referral to Cardiology       Comments: If you have not heard from the Cardiology office within the next 72 hours please call [phone].   10/27/23 1924    nitroGLYCERIN (NITROSTAT) 0.4 MG SL tablet  Every 5 min PRN        10/27/23 1931            The plan for this patient was discussed with Dr. Rush Landmark, who voiced agreement and who oversaw evaluation and treatment of this patient.   Clinical Complexity A medically appropriate history, review of systems, and physical exam was performed.  Patient's presentation is most consistent with acute presentation with potential threat to life or bodily function.  Medical Decision Making Amount and/or Complexity of Data Reviewed Labs: ordered. Radiology: ordered.  Risk Prescription drug management.    Physical Exam and Medical History   Vitals:   10/27/23 1530 10/27/23 1645 10/27/23 1730 10/27/23 1900  BP: 130/68 113/66 121/68 (!) 151/71  Pulse: (!) 46 (!) 44 Marland Kitchen)  52 (!) 45  Resp: 15 13 16  (!) 21  Temp:      SpO2: 100% 100% 100% 100%  Weight:      Height:        Physical Exam Vitals and nursing note reviewed.  Constitutional:      General: He is not in acute distress.    Appearance: He is well-developed.  HENT:     Head: Normocephalic and atraumatic.  Eyes:     Conjunctiva/sclera:  Conjunctivae normal.  Cardiovascular:     Rate and Rhythm: Regular rhythm. Bradycardia present.     Heart sounds: No murmur heard. Pulmonary:     Effort: Pulmonary effort is normal. No respiratory distress.     Breath sounds: Normal breath sounds.  Abdominal:     Palpations: Abdomen is soft.     Tenderness: There is no abdominal tenderness.  Musculoskeletal:        General: No swelling.     Cervical back: Neck supple.  Skin:    General: Skin is warm and dry.     Capillary Refill: Capillary refill takes less than 2 seconds.  Neurological:     Mental Status: He is alert.  Psychiatric:        Mood and Affect: Mood normal.     Medical History: Allergies  Allergen Reactions   Sulfa Antibiotics Nausea Only and Rash   Sulfamethoxazole Nausea Only and Rash   Sulfasalazine Nausea Only and Rash   Past Medical History:  Diagnosis Date   Acid reflux    a. well-controlled with ranitidine.   BPH (benign prostatic hyperplasia)    a. s/p TURP 2012.   Colon polyps    COVID-19    Diastolic dysfunction    a. 10/2018 Echo: EF >65%. Impaired relaxation.  Nl RV fxn.   Dizziness    Edema leg    Essential hypertension 09/10/2013   Eye problem    a. L eye blurred vision - seeing eye doctor for tissue pulling away from eye.   Hard of hearing    Hiatal hernia    a. Small-mod by CXR 2015.   High cholesterol    Non-obstructive CAD (coronary artery disease)    a. 11/2000: LM nl, LAD min irregs, LCX nl, RCA min irregs; b. 08/2013 Cor CTA: LM nl, LAD 0-25ost, 25-50p, D1 0-25, LCX 0-25, RCA dominant, 25-68m, RCA <25d, RPLV 0-25.   OSA (obstructive sleep apnea)    Prostatitis    Pulmonary nodules    Shoulder pain    Sinus bradycardia    a. 07/2019 Zio: SB to sinus rhythm, 38-49.  No AV block, pauses, prolonged/sustained arrhythmias.    Past Surgical History:  Procedure Laterality Date   CORONARY ANGIOPLASTY     EYE SURGERY     PARS PLANA VITRECTOMY Left 08/29/2013   WITH MEMBRANE PEEL, 23  GAUGE   TRANSURETHRAL RESECTION OF PROSTATE     TRANSURETHRAL RESECTION OF PROSTATE     Urethral stricture surgery     VASTECTOMY     Family History  Problem Relation Age of Onset   Other Mother        Died at 49 - "old age"   Stroke Father        Died at 61   Dementia Brother    Stroke Brother    Tuberculosis Paternal Grandfather    Sleep apnea Son    CAD Neg Hx     Social History   Tobacco Use   Smoking status:  Never   Smokeless tobacco: Never  Vaping Use   Vaping status: Never Used  Substance Use Topics   Alcohol use: Yes    Comment: Once per month   Drug use: No    Procedures   If procedures were preformed on this patient, they are listed below:  Procedures   -------- HPI and MDM generated using voice dictation software and may contain dictation errors. Please contact me for any clarification or with any questions.   Cephus Slater, MD Emergency Medicine PGY-2    Caron Presume, MD 10/27/23 Kristopher Oppenheim    Caron Presume, MD 10/27/23 1931    Tegeler, Canary Brim, MD 10/29/23 [id number]

## 2023-10-27 NOTE — ED Triage Notes (Signed)
 Pt c/o shortness of breath and left sided chest pain that started a few hours ago. Pt denies nausea or vomiting.

## 2023-10-27 NOTE — Consult Note (Signed)
 Cardiology Consult    Patient ID: [name] MRN: [id number], DOB/AGE: 03-08-1941   Admit date: 10/27/2023 Date of Consult: 10/27/2023  Primary Physician: Garlan Fillers, MD Primary Cardiologist: Donato Schultz, MD Requesting Provider: C. Tegeler, MD  Patient Profile    [name] is a 83 y.o. male nonobstructive CAD, HTN, HL, sinus brady, OSA, GERD, memory loss and hiatal hernia, who is being seen today for the evaluation of chest pain at the request of Dr. Rush Landmark.  Past Medical History  Subjective  Past Medical History:  Diagnosis Date   Acid reflux    a. well-controlled with ranitidine.   BPH (benign prostatic hyperplasia)    a. s/p TURP 2012.   Colon polyps    COVID-19    Diastolic dysfunction    a. 10/2018 Echo: EF >65%. Impaired relaxation.  Nl RV fxn.   Dizziness    Edema leg    Essential hypertension 09/10/2013   Eye problem    a. L eye blurred vision - seeing eye doctor for tissue pulling away from eye.   Hard of hearing    Hiatal hernia    a. Small-mod by CXR 2015.   High cholesterol    Non-obstructive CAD (coronary artery disease)    a. 11/2000: LM nl, LAD min irregs, LCX nl, RCA min irregs; b. 08/2013 Cor CTA: LM nl, LAD 0-25ost, 25-50p, D1 0-25, LCX 0-25, RCA dominant, 25-63m, RCA <25d, RPLV 0-25.   OSA (obstructive sleep apnea)    Prostatitis    Pulmonary nodules    Shoulder pain    Sinus bradycardia    a. 07/2019 Zio: SB to sinus rhythm, 38-49.  No AV block, pauses, prolonged/sustained arrhythmias.    Past Surgical History:  Procedure Laterality Date   CORONARY ANGIOPLASTY     EYE SURGERY     PARS PLANA VITRECTOMY Left 08/29/2013   WITH MEMBRANE PEEL, 23 GAUGE   TRANSURETHRAL RESECTION OF PROSTATE     TRANSURETHRAL RESECTION OF PROSTATE     Urethral stricture surgery     VASTECTOMY       Allergies  Allergies  Allergen Reactions   Sulfa Antibiotics Nausea Only and Rash   Sulfamethoxazole Nausea Only and Rash   Sulfasalazine Nausea  Only and Rash      History of Present Illness   83 y/o ? w/ a h/o nonobstructive CAD, HTN, HL, sinus brady, OSA, GERD, memory loss and hiatal hernia.  He previously underwent diagnostic cath in 2002, which showed minimal, nonobstructive CAD.  More recently, he underwent coronary CT angiogram in 2015, which again showed nonobstructive CAD for which he was medically managed.  Is a long history of sinus bradycardia with Zio monitoring in December 2020 showing rates of 38-49 without evidence of AV block, pauses, or sustained arrhythmias.  In that setting, he has been conservatively managed with avoidance of AV nodal blocking agents.  Mr. [name] is now followed by Dr. Anne Fu in the outpatient setting, and was doing well at office visit in May 2024.  He was in his usual state of health until roughly 22 Noon today 10/27/23.  The patient presents with chest pain and shortness of breath. He is accompanied by a caregiver who provides additional information. He is a rather poor historian & the majority of the interview was with the wife (June, who is a retired Char[employer] fundraiser) providing answers & other information.  He experienced chest pain and shortness of breath earlier today, prompting a visit to the hospital.  The chest pain began around noon and lasted until approximately 2:30 to 3:00 PM. The pain was located in the chest and shoulder, without radiation to the arm, neck, or jaw. He was unable to describe the quality of the pain.  At worst, the pain was ~6/10 -- currrently 0/10 after NTG. The pain subsided after administration of nitroglycerin. No recent cold symptoms, fever, chills, coughing, wheezing, leg swelling, or dizziness. He denies any nausea or vomiting associated with the chest pain.  He has a history of a heart catheterization performed many years ago. There have been no similar chest pain episodes recently, although he occasionally experiences shortness of breath. No unusual heart sensations such as palpitations  or arrhythmias.  Current medications include meclizine for dizziness and aspirin. He is not currently taking any blood pressure medications. There was a mention of rosuvastatin, but it is unclear if he is currently taking it due to memory issues.  In the emergency department, he was found to be afebrile, with heart rate of 56, blood pressure of 122/70, and saturation of 98% on room air.  ECG showed sinus brady @ 53 w/o acute ST/T changes.  Labs unremarkable w/ nl trop of 3.  CXR w/o active cardiopulm dzs.  Hiatal hernia noted.   Inpatient Medications  Subjective    lidocaine  1 patch Transdermal Q24H   Prior to Admission medications   Medication Sig Start Date End Date Taking? Authorizing Provider  acetaminophen (TYLENOL) 500 MG tablet Take 1,000 mg by mouth every 6 (six) hours as needed for mild pain or moderate pain.    [provider]  aspirin 81 MG tablet Take 81 mg by mouth daily.    [provider]  bifidobacterium infantis (ALIGN) capsule Take 1 capsule by mouth daily.    [provider]  Calcium Carb-Cholecalciferol (CALCIUM 500 + D) 500-5 MG-MCG TABS Take 1 tablet by mouth 2 (two) times daily for 14 days. 06/28/23 07/12/23  Arabella Merles, PA-C  desmopressin (DDAVP) 0.2 MG tablet Take 0.4 mg by mouth at bedtime.  09/20/17   [provider]  donepezil (ARICEPT) 5 MG tablet TAKE 1 TABLET BY MOUTH EVERYDAY AT BEDTIME 08/21/23   Butch Penny, NP  FERREX 150 150 MG capsule Take 150 mg by mouth daily. 07/30/18   [provider]  gabapentin (NEURONTIN) 100 MG capsule TAKE 1 CAPSULE BY MOUTH EVERYDAY AT BEDTIME 09/03/17   [provider]  meclizine (ANTIVERT) 25 MG tablet Take 25 mg by mouth daily as needed. 06/05/13   [provider]  memantine (NAMENDA) 10 MG tablet TAKE 1 TABLET BY MOUTH TWICE A DAY 08/29/23   Anson Fret, MD  Multiple Vitamins-Minerals (CENTRUM SILVER PO) Take 1 tablet by mouth daily.    [provider]  ondansetron (ZOFRAN) 4 MG tablet Take 1 tablet (4 mg total) by mouth every 6 (six) hours. 08/16/22   Wynetta Fines, MD  Polyethyl Glycol-Propyl Glycol (SYSTANE OP) Apply 2 drops to eye daily as needed (Dry eyes). 2 drops each eye    [provider]  potassium chloride (KLOR-CON) 10 MEQ tablet Take 1 tablet (10 mEq total) by mouth daily for 7 days. 06/28/23 07/05/23  Arabella Merles, PA-C  rOPINIRole (REQUIP) 1 MG tablet Take 2 tablets by mouth at bedtime.  10/22/18   [provider]  rosuvastatin (CRESTOR) 20 MG tablet Take 20 mg by mouth daily. 12/04/20   [provider]     Family History  Family History  Problem Relation Age of Onset   Other Mother        Died at 65 - "old age"   Stroke Father        Died at 73   Dementia Brother    Stroke Brother    Tuberculosis Paternal Grandfather    Sleep apnea Son    CAD Neg Hx    He indicated that his mother is deceased. He indicated that his father is deceased. He indicated that his sister is alive. He indicated that his brother is alive. He indicated that his maternal grandmother is deceased. He indicated that his maternal grandfather is deceased. He indicated that his paternal grandmother is deceased. He indicated that his paternal grandfather is deceased. He indicated that his son is alive. He indicated that the status of his neg hx is unknown.   Social History    Social History   Socioeconomic History   Marital status: Married    Spouse name: June   Number of children: Not on file   Years of education: Not on file   Highest education level: Not on file  Occupational History   Not on file  Tobacco Use   Smoking status: Never   Smokeless tobacco: Never  Vaping Use   Vaping status: Never Used  Substance and Sexual Activity   Alcohol use: Yes    Comment: Once per month   Drug use: No   Sexual activity: Not on file  Other Topics Concern   Not on file  Social History Narrative    Not on file   Social Drivers of Health   Financial Resource Strain: Not on file  Food Insecurity: Not on file  Transportation Needs: Not on file  Physical Activity: Not on file  Stress: Not on file  Social Connections: Not on file  Intimate Partner Violence: Not on file     Review of Systems    General:  No chills, fever, night sweats or weight changes.  Cardiovascular:  No dyspnea on exertion, edema, orthopnea, palpitations, paroxysmal nocturnal dyspnea. Dermatological: No rash, lesions/masses Respiratory: No cough, dyspnea Urologic: No hematuria, dysuria Abdominal:   No nausea, vomiting, diarrhea, bright red blood per rectum, melena, or hematemesis Neurologic:  No visual changes, wkns, changes in mental status. All other systems reviewed and are otherwise negative except as noted above.    Objective  Physical Exam    Blood pressure 121/68, pulse (!) 52, temperature 98.3 F (36.8 C), resp. rate 16, height 5\' 10"  (1.778 m), weight 92.5 kg, SpO2 100%.  General: Pleasant, NAD Psych: Normal affect. Neuro: Alert and oriented X 3. Moves all extremities spontaneously. HEENT: Normal  Neck: Supple without bruits or JVD. Lungs:  Resp regular and unlabored, CTA. Heart: RR with Bradycardia. Normal S1 &S2 no s3, s4, or murmurs. Abdomen: Soft, non-tender, non-distended, BS + x 4.  Extremities: No clubbing, cyanosis or edema. DP/PT2+, Radials 2+ and equal bilaterally.  Labs    Cardiac Enzymes Recent Labs  Lab 10/27/23 1503  TROPONINIHS 3     BNP No results found for: "BNP"  ProBNP No results found for: "PROBNP"  Lab Results  Component Value Date   WBC 8.1 10/27/2023   HGB 15.6 10/27/2023   HCT 47.4 10/27/2023   MCV 96.7 10/27/2023   PLT 246 10/27/2023    Recent Labs  Lab 10/27/23 1503  NA 142  K 4.0  CL 108  CO2 24  BUN 14  CREATININE 0.97  CALCIUM 9.2  GLUCOSE 108*   Lab Results  Component Value Date   CHOL 203 (H) 09/25/2019   HDL 59 09/25/2019    LDLCALC 126 (H) 09/25/2019   TRIG 100 09/25/2019   Lab Results  Component Value Date   DDIMER 0.36 10/31/2018      Radiology Studies    DG Chest 2 View Result Date: 10/27/2023 CLINICAL DATA:  Chest pain EXAM: CHEST - 2 VIEW COMPARISON:  Chest radiograph dated 08/16/2022 FINDINGS: Normal lung volumes. No focal consolidations. Retrocardiac lucency in keeping with known hiatal hernia. No pleural effusion or pneumothorax. The heart size and mediastinal contours are within normal limits. No acute osseous abnormality. IMPRESSION: 1. No active cardiopulmonary disease. 2. Hiatal hernia. Electronically Signed   By: Agustin Cree M.D.   On: 10/27/2023 16:29      ECG & Cardiac Imaging    Sinus bradycardia, rate 50 bpm.  Borderline LVH criteria.  Otherwise no ischemic ST or T wave changes.- personally reviewed.  Assessment & Plan    Chest Pain with Intermediate CV risk --> Mild CAD with previous catheterization & Cor CTA.  Acute chest pain relieved by nitroglycerin.  Symptoms are suggestive of  possible coronary involvement, but  Initial Troponin is negative, and EKG is nonspecific/normal. Differential includes unstable angina or non-cardiac pain. Further evaluation required to rule out myocardial infarction. - Await second troponin test results. - Outpatient cardiac evaluation if troponin remains low. - Treat as unstable angina with inpatient monitoring if troponin increases. - Provide nitroglycerin for relief. -Discussed CT coronary angiography if troponin remains low. - Consider coronary angiography if significant blockage suspected.  Discussed CT coronary angiography and potential coronary angiography. Explained risks of invasive procedures considering age.  Algorithm pending Troponin #2 -If Trop #2 is < 100 Consider CT coronary angiography =>  If no Delta Trop, consider d/c home with OP CorCTA; if Delta Trop but < 100 consider in-patient Cor CTA In AM,;  If Delt Trop with Trop>100, initiate  IV Heparin & discuss options in AM with rounding team - Cor CTA v.s Cardiac Cath.  Discussed Invasive Coronary Angiography/ Cath if Cor CTA shows significant blockage.  Hyperlipidemia Managed with rosuvastatin 20 mg daily despite previous memory concerns. - Continue rosuvastatin therapy.  Goals of Care Discussed potential invasive procedures considering age and preferences. Emphasized quality of life and comfort. - Discuss preferences for invasive procedures versus medical management. --> as of yet, they are undecided.  Follow-up Pending evaluation results.+ -- Arrange outpatient follow-up for CT coronary angiography if discharged - Communicate results and plan with Dr. Anne Fu.  Risk Assessment/Risk Scores:     TIMI Risk Score for Unstable Angina or Non-ST Elevation MI:   The patient's TIMI risk score is  , which indicates a  % risk of all cause mortality, new or recurrent myocardial infarction or need for urgent revascularization in the next 14 days.      Signed, Bryan Lemma, NP 10/27/2023, 7:05 PM  For questions or updates, please contact   Please consult [email or web address] for contact info under Cardiology/STEMI.

## 2023-10-27 NOTE — Hospital Course (Signed)
 Afebrile, 56, 122/70, 98% on room air.  ECG showed sinus brady @ 53 w/o acute ST/T changes.  Labs unremarkable w/ nl trop of 3.  CXR w/o active cardiopulm dzs.  Hiatal hernia noted.

## 2023-10-27 NOTE — ED Provider Triage Note (Signed)
 Emergency Medicine Provider Triage Evaluation Note  [name] , a 83 y.o. male  was evaluated in triage.  Pt complains of left chest pain, and shortness of breath, that began around 12:00 today.  He states that it just hurts, and radiates to his upper back.  Denies any nausea, vomiting, pain down either arm, or abdomen.  Pain is not pleuritic.  Denies any aggravating factors.  No alleviating factors.  Review of Systems  Positive: Chest pain, sob Negative: fevers  Physical Exam  BP 122/70 (BP Location: Right Arm)   Pulse (!) 56   Temp 98.3 F (36.8 C)   Resp 18   Ht 5\' 10"  (1.778 m)   Wt 92.5 kg   SpO2 98%   BMI 29.26 kg/m  Gen:   Awake, no distress   Resp:  Normal effort  MSK:   Moves extremities without difficulty  Other:   Medical Decision Making  Medically screening exam initiated at 3:25 PM.  Appropriate orders placed.  [name] was informed that the remainder of the evaluation will be completed by another provider, this initial triage assessment does not replace that evaluation, and the importance of remaining in the ED until their evaluation is complete.    [name], [place] 10/27/23 1525

## 2023-10-27 NOTE — ED Notes (Signed)
 Cardiology at bedside.

## 2023-10-27 NOTE — ED Triage Notes (Signed)
 Chest pain and SOB onset today around lunch time. Pain in the left side of the chest without radiation.

## 2023-10-27 NOTE — ED Notes (Signed)
 Patient is being discharged from the Urgent Care and sent to the Emergency Department via POV . Per Dr. Marlinda Mike, patient is in need of higher level of care due to SOB and chest pain. Patient is aware and verbalizes understanding of plan of care.  Vitals:   10/27/23 1418  BP: 115/62  Pulse: (!) 56  Resp: 18  Temp: 97.9 F (36.6 C)  SpO2: 94%

## 2023-10-27 NOTE — Discharge Instructions (Signed)
 [name]:  Thank you for allowing [place] to take care of you today.  We hope you begin feeling better soon.  To-Do: Please follow-up with cardiology.  You should receive a phone call and next few business days to schedule your appointment.  If you do not receive a phone call, please call their office directly at [phone]  to schedule an appointment. Please return to the Emergency Department or call 911 if you experience chest pain, shortness of breath, severe pain, severe fever, altered mental status, or have any reason to think that you need emergency medical care.  Thank you again.  Hope you feel better soon.  Department of Emergency Medicine [facility]

## 2023-10-27 NOTE — ED Notes (Signed)
 Assumed care of patient. Denies chest pain at this time. Wife is at bedside.

## 2023-11-02 ENCOUNTER — Ambulatory Visit: Admitting: Physician Assistant

## 2023-11-02 NOTE — Progress Notes (Signed)
 Cardiology Office Note:    Date:  11/03/2023  [name], DOB 1941-07-22, MRN [id number] PCP: Garlan Fillers, MD  [facility] Providers Cardiologist:  Donato Schultz, MD       Patient Profile:      Coronary artery disease  CCTA 09/05/13: mod nonobstructive CAD [ostial LAD 0-25, proximal-mid LAD 25-50, D1 0-25, LCx 0-25, RCA mid 25-50, distal <25, PDA and PLVB 0-25 TTE 11/02/18: EF > 65, midl (LVH) Left Ventricular Hypertrophy, Gr 1 DD, NL RVSF Hypertension  Hyperlipidemia Intolerant of statins-memory loss Sinus bradycardia  OSA Memory loss          Discussed the use of AI scribe software for clinical note transcription with the patient, who gave verbal consent to proceed.  History of Present Illness [name] is a 83 y.o. male who returns for post hospital follow up. He was last seen by Dr. Anne Fu in 12/2022.  He was seen in the [facility] emergency room 10/27/2023 for chest pain.  He was seen by Dr. Herbie Baltimore for consultation.  Troponins were negative x 2.  Chest x-ray showed no acute disease.  EKG was personally reviewed and demonstrated sinus bradycardia without acute changes.  He was discharged home with plans for outpatient follow-up (consider CCTA).  He is accompanied by his wife, Erskine Squibb. He went to the emergency room with chest discomfort, characterized by shortness of breath and pain radiating to the left shoulder. This episode began around noon and lasted until approximately 3:30 to 4:00 PM, at which point he received treatment in the emergency room, including a lidocaine patch and nitroglycerin.   Since the emergency room visit, he has persistent L chest discomfort, described as a 'little discomfort' and rated as a 5 or 6 out of 10. He is uncertain about the exact onset of the pain today and has difficulty recalling if the pain ever completely resolves. The discomfort is more pronounced on the L side and he notes pain in his back. No recent episodes of shortness  of breath, syncope, or leg swelling. No difficulty breathing when lying flat, hemoptysis, or changes in bowel or urinary habits. He has a history of memory issues, which makes it challenging for him to describe his symptoms accurately.   ROS-See HPI     Studies Reviewed:   EKG Interpretation Date/Time:  Friday November 03 2023 11:14:42 EST Ventricular Rate:  50 PR Interval:  214 QRS Duration:  86 QT Interval:  434 QTC Calculation: 395 R Axis:   15  Text Interpretation: Sinus bradycardia with 1st degree A-V block No significant change since last tracing Confirmed by Tereso Newcomer [phone]) on 11/03/2023 11:16:04 AM     Results LABS - [facility]  Total cholesterol: 148 mg/dL (21/30/8657) HDL: 51 mg/dL (84/69/6295) LDL: 79 mg/dL (28/41/3244) Triglycerides: 88 mg/dL (08/31/7251) Hemoglobin: 15.6 g/dL (66/44/0347) Creatinine: 3.97 mg/dL (42/59/5638) Potassium: 4.0 mmol/L (10/27/2023) ALT: 20 U/L (06/28/2023) TSH: 2.451 mIU/L (06/28/2023)    Risk Assessment/Calculations:             Physical Exam:   VS:  BP 118/60   Pulse 80   Ht 5\' 7"  (1.702 m)   Wt 206 lb (93.4 kg)   SpO2 96%   BMI 32.26 kg/m    Wt Readings from Last 3 Encounters:  11/03/23 206 lb (93.4 kg)  10/27/23 203 lb 14.8 oz (92.5 kg)  06/28/23 203 lb 14.8 oz (92.5 kg)    Constitutional:      Appearance: Healthy  appearance. Not in distress.  Neck:     Vascular: JVD normal.  Pulmonary:     Breath sounds: Normal breath sounds. No wheezing. No rales.  Cardiovascular:     Bradycardia present. Regular rhythm.     Murmurs: There is no murmur.  Edema:    Peripheral edema absent.  Abdominal:     Palpations: Abdomen is soft.        Assessment and Plan:   Assessment & Plan Coronary artery disease involving native coronary artery of native heart with angina pectoris [facility]) He recently went to the emergency room with prolonged chest discomfort.  Troponins were negative.  Plan was to pursue outpatient coronary CTA.  He notes  recurrent chest discomfort today that sounds noncardiac and is likely musculoskeletal.  Prior CT in 2015 demonstrated nonobstructive CAD.  His symptoms are difficult for him to characterize given his memory issues.  Of note, he is bradycardic and has medical therapy for memory loss includes memantine and donepezil. - Order coronary CTA. - No beta-blocker given for CT with HR 50 on EKG today - Continue aspirin 81 mg daily. - Continue Crestor 20 mg daily. - Advise ER visit if symptoms worsen. - Follow up 6 months or sooner if CT abnormal Pure hypercholesterolemia Recent labs with LDL near goal.   - Continue Crestor 20 mg daily.      Dispo:  Return in about 6 months (around 05/05/2024) for Routine Follow Up, w/ Dr. Anne Fu.  Signed, Tereso Newcomer, PA-C

## 2023-11-03 ENCOUNTER — Encounter: Payer: Self-pay | Admitting: Physician Assistant

## 2023-11-03 ENCOUNTER — Ambulatory Visit: Attending: Physician Assistant | Admitting: Physician Assistant

## 2023-11-03 VITALS — BP 118/60 | HR 80 | Ht 67.0 in | Wt 206.0 lb

## 2023-11-03 DIAGNOSIS — I25119 Atherosclerotic heart disease of native coronary artery with unspecified angina pectoris: Secondary | ICD-10-CM | POA: Diagnosis not present

## 2023-11-03 DIAGNOSIS — E7800 Pure hypercholesterolemia, unspecified: Secondary | ICD-10-CM | POA: Insufficient documentation

## 2023-11-03 DIAGNOSIS — R079 Chest pain, unspecified: Secondary | ICD-10-CM | POA: Insufficient documentation

## 2023-11-03 DIAGNOSIS — I10 Essential (primary) hypertension: Secondary | ICD-10-CM

## 2023-11-03 MED ORDER — METOPROLOL TARTRATE 25 MG PO TABS
ORAL_TABLET | ORAL | 0 refills | Status: DC
Start: 2023-11-03 — End: 2023-11-03

## 2023-11-03 NOTE — Patient Instructions (Signed)
 Medication Instructions:  Your physician recommends that you continue on your current medications as directed. Please refer to the Current Medication list given to you today.  *If you need a refill on your cardiac medications before your next appointment, please call your pharmacy*   Lab Work: None ordered  If you have labs (blood work) drawn today and your tests are completely normal, you will receive your results only by: MyChart Message (if you have MyChart) OR A paper copy in the mail If you have any lab test that is abnormal or we need to change your treatment, we will call you to review the results.   Testing/Procedures: Your physician has requested that you have cardiac CT. Cardiac computed tomography (CT) is a painless test that uses an x-ray machine to take clear, detailed pictures of your heart. For further information please visit [email or web address] Please follow instruction sheet as given.     Your cardiac CT will be scheduled at one of the below locations:   [facility] [address] [place], [place] [address] [phone]  OR  [facility] [address] [place], [place] [address] [phone]  OR   [facility] [address] [place], [place] [address] [phone]  OR   [facility] [address] [place], [place] [address] [phone]  If scheduled at [facility], please arrive at the [facility] (Entrance C2) of [facility] 30 minutes prior to test start time. You can use the FREE valet parking offered at entrance C (encouraged to control the heart rate for the test)  Proceed to the [facility] Radiology Department (first floor) to check-in and test prep.  All radiology patients and guests should use entrance C2 at [facility], accessed from [facility], even though the hospital's physical address  listed is [address].    If scheduled at [facility] or [facility], please arrive 15 mins early for check-in and test prep.  There is spacious parking and easy access to the radiology department from the [facility] entrance. Please enter here and check-in with the desk attendant.   If scheduled at [facility], please arrive 30 minutes early for check-in and test prep.  Please follow these instructions carefully (unless otherwise directed):  An IV will be required for this test and Nitroglycerin will be given.  Hold all erectile dysfunction medications at least 3 days (72 hrs) prior to test. (Ie viagra, cialis, sildenafil, tadalafil, etc)   On the Night Before the Test: Be sure to Drink plenty of water. Do not consume any caffeinated/decaffeinated beverages or chocolate 12 hours prior to your test. Do not take any antihistamines 12 hours prior to your test.   On the Day of the Test: Drink plenty of water until 1 hour prior to the test. Do not eat any food 1 hour prior to test. You may take your regular medications prior to the test.    After the Test: Drink plenty of water. After receiving IV contrast, you may experience a mild flushed feeling. This is normal. On occasion, you may experience a mild rash up to 24 hours after the test. This is not dangerous. If this occurs, you can take Benadryl 25 mg, Zyrtec, Claritin, or Allegra and increase your fluid intake. (Patients taking Tikosyn should avoid Benadryl, and may take Zyrtec, Claritin, or Allegra) If  you experience trouble breathing, this can be serious. If it is severe call [phone] IMMEDIATELY. If it is mild, please call our office.  We will call to schedule your test 2-4 weeks out understanding that some insurance companies will need an authorization prior to the service being performed.   For more information and frequently asked questions, please  visit our website : [email or web address]  For non-scheduling related questions, please contact the cardiac imaging nurse navigator should you have any questions/concerns: Cardiac Imaging Nurse Navigators Direct Office Dial: [phone]   For scheduling needs, including cancellations and rescheduling, please call [place], [phone].    Follow-Up: At [facility], you and your health needs are our priority.  As part of our continuing mission to provide you with exceptional heart care, we have created designated Provider Care Teams.  These Care Teams include your primary Cardiologist (physician) and Advanced Practice Providers (APPs -  Physician Assistants and Nurse Practitioners) who all work together to provide you with the care you need, when you need it.  We recommend signing up for the patient portal called "MyChart".  Sign up information is provided on this After Visit Summary.  MyChart is used to connect with patients for Virtual Visits (Telemedicine).  Patients are able to view lab/test results, encounter notes, upcoming appointments, etc.  Non-urgent messages can be sent to your provider as well.   To learn more about what you can do with MyChart, go to [email or web address].    Your next appointment:   6 month(s)  Provider:   Donato Schultz, MD     Other Instructions     1st Floor: - Lobby - Registration  - Pharmacy  - Lab - Cafe  2nd Floor: - PV Lab - Diagnostic Testing (echo, CT, nuclear med)  3rd Floor: - Vacant  4th Floor: - TCTS (cardiothoracic surgery) - AFib Clinic - Structural Heart Clinic - Vascular Surgery  - Vascular Ultrasound  5th Floor: - HeartCare Cardiology (general and EP) - Clinical Pharmacy for coumadin, hypertension, lipid, weight-loss medications, and med management appointments    Valet parking services will be available as well.

## 2023-11-03 NOTE — Assessment & Plan Note (Signed)
 Recent labs with LDL near goal.   - Continue Crestor 20 mg daily.

## 2023-11-03 NOTE — Assessment & Plan Note (Signed)
 He recently went to the emergency room with prolonged chest discomfort.  Troponins were negative.  Plan was to pursue outpatient coronary CTA.  He notes recurrent chest discomfort today that sounds noncardiac and is likely musculoskeletal.  Prior CT in 2015 demonstrated nonobstructive CAD.  His symptoms are difficult for him to characterize given his memory issues.  Of note, he is bradycardic and has medical therapy for memory loss includes memantine and donepezil. - Order coronary CTA. - No beta-blocker given for CT with HR 50 on EKG today - Continue aspirin 81 mg daily. - Continue Crestor 20 mg daily. - Advise ER visit if symptoms worsen. - Follow up 6 months or sooner if CT abnormal

## 2023-11-21 ENCOUNTER — Telehealth (HOSPITAL_COMMUNITY): Payer: Self-pay | Admitting: *Deleted

## 2023-11-21 NOTE — Telephone Encounter (Signed)
 Reaching out to patient to offer assistance regarding upcoming cardiac imaging study; spoke to wife (ok per DPR), verbalizes understanding of appt date/time, parking situation and where to check in, pre-test NPO status and medications ordered, and verified current allergies; name and call back number provided for further questions should they arise Johney Frame RN Navigator Cardiac Imaging [facility] [phone] office [phone] cell

## 2023-11-22 ENCOUNTER — Other Ambulatory Visit: Payer: Self-pay | Admitting: Cardiology

## 2023-11-22 ENCOUNTER — Ambulatory Visit (HOSPITAL_BASED_OUTPATIENT_CLINIC_OR_DEPARTMENT_OTHER)
Admission: RE | Admit: 2023-11-22 | Discharge: 2023-11-22 | Disposition: A | Discharge status: Home / Self Care | Source: Ambulatory Visit | Attending: Cardiology | Admitting: Cardiology

## 2023-11-22 ENCOUNTER — Ambulatory Visit (HOSPITAL_COMMUNITY)
Admission: RE | Admit: 2023-11-22 | Discharge: 2023-11-22 | Disposition: A | Discharge status: Home / Self Care | Source: Ambulatory Visit | Attending: Physician Assistant | Admitting: Physician Assistant

## 2023-11-22 DIAGNOSIS — R931 Abnormal findings on diagnostic imaging of heart and coronary circulation: Secondary | ICD-10-CM | POA: Insufficient documentation

## 2023-11-22 DIAGNOSIS — I25119 Atherosclerotic heart disease of native coronary artery with unspecified angina pectoris: Secondary | ICD-10-CM | POA: Diagnosis not present

## 2023-11-22 DIAGNOSIS — K449 Diaphragmatic hernia without obstruction or gangrene: Secondary | ICD-10-CM | POA: Insufficient documentation

## 2023-11-22 DIAGNOSIS — E7800 Pure hypercholesterolemia, unspecified: Secondary | ICD-10-CM | POA: Diagnosis not present

## 2023-11-22 DIAGNOSIS — I700 Atherosclerosis of aorta: Secondary | ICD-10-CM | POA: Insufficient documentation

## 2023-11-22 DIAGNOSIS — R079 Chest pain, unspecified: Secondary | ICD-10-CM | POA: Insufficient documentation

## 2023-11-22 DIAGNOSIS — I10 Essential (primary) hypertension: Secondary | ICD-10-CM | POA: Insufficient documentation

## 2023-11-22 MED ORDER — IOHEXOL 350 MG/ML SOLN
95.0000 mL | Freq: Once | INTRAVENOUS | Status: AC | PRN
Start: 2023-11-22 — End: 2023-11-22
  Administered 2023-11-22: 95 mL via INTRAVENOUS

## 2023-11-22 MED ORDER — NITROGLYCERIN 0.4 MG SL SUBL
0.8000 mg | SUBLINGUAL_TABLET | Freq: Once | SUBLINGUAL | Status: AC
  Administered 2023-11-22: 0.8 mg via SUBLINGUAL

## 2023-11-22 MED ORDER — NITROGLYCERIN 0.4 MG SL SUBL
SUBLINGUAL_TABLET | SUBLINGUAL | Status: AC
  Filled 2023-11-22: qty 2

## 2023-11-23 ENCOUNTER — Other Ambulatory Visit: Payer: Self-pay | Admitting: *Deleted

## 2023-11-23 ENCOUNTER — Telehealth: Payer: Self-pay | Admitting: *Deleted

## 2023-11-23 DIAGNOSIS — E7800 Pure hypercholesterolemia, unspecified: Secondary | ICD-10-CM

## 2023-11-23 NOTE — Telephone Encounter (Signed)
-----   Message from Tereso Newcomer sent at 11/23/2023  8:11 AM EDT ----- Coronary CTA demonstrates moderate nonobstructive coronary artery disease.  There are no blockages noted that would contribute to chest pain at this time.  This is good news.  However, we do need to continue to aggressively manage risk factors.  Last LDL which could be obtained was above 70.  Goal LDL should be at least <70. PLAN:  -Please arrange fasting lipid panel Tereso Newcomer, PA-C    11/23/2023 8:07 AM

## 2023-11-27 DIAGNOSIS — E7800 Pure hypercholesterolemia, unspecified: Secondary | ICD-10-CM | POA: Diagnosis not present

## 2023-11-27 LAB — LIPID PANEL
Chol/HDL Ratio: 2.4 ratio (ref 0.0–5.0)
Cholesterol, Total: 134 mg/dL (ref 100–199)
HDL: 56 mg/dL (ref >39)
LDL Chol Calc (NIH): 62 mg/dL (ref 0–99)
Triglycerides: 82 mg/dL (ref 0–149)
VLDL Cholesterol Cal: 16 mg/dL (ref 5–40)

## 2023-11-28 NOTE — Progress Notes (Signed)
 Pt has been made aware of normal result and verbalized understanding.  jw

## 2024-01-18 ENCOUNTER — Ambulatory Visit: Payer: Medicare Other | Admitting: Adult Health

## 2024-01-23 ENCOUNTER — Telehealth: Payer: Self-pay | Admitting: Adult Health

## 2024-01-23 DIAGNOSIS — E785 Hyperlipidemia, unspecified: Secondary | ICD-10-CM | POA: Diagnosis not present

## 2024-01-23 DIAGNOSIS — I10 Essential (primary) hypertension: Secondary | ICD-10-CM | POA: Diagnosis not present

## 2024-01-23 DIAGNOSIS — Z125 Encounter for screening for malignant neoplasm of prostate: Secondary | ICD-10-CM | POA: Diagnosis not present

## 2024-01-23 DIAGNOSIS — I2510 Atherosclerotic heart disease of native coronary artery without angina pectoris: Secondary | ICD-10-CM | POA: Diagnosis not present

## 2024-01-23 NOTE — Telephone Encounter (Signed)
 Pt wife called to reschedule appt   Appt Scheduled

## 2024-01-30 ENCOUNTER — Ambulatory Visit: Admitting: Adult Health

## 2024-01-30 DIAGNOSIS — Z0000 Encounter for general adult medical examination without abnormal findings: Secondary | ICD-10-CM | POA: Diagnosis not present

## 2024-01-30 DIAGNOSIS — R82998 Other abnormal findings in urine: Secondary | ICD-10-CM | POA: Diagnosis not present

## 2024-01-30 DIAGNOSIS — G8929 Other chronic pain: Secondary | ICD-10-CM | POA: Diagnosis not present

## 2024-01-30 DIAGNOSIS — E785 Hyperlipidemia, unspecified: Secondary | ICD-10-CM | POA: Diagnosis not present

## 2024-01-30 DIAGNOSIS — M545 Low back pain, unspecified: Secondary | ICD-10-CM | POA: Diagnosis not present

## 2024-01-30 DIAGNOSIS — I2510 Atherosclerotic heart disease of native coronary artery without angina pectoris: Secondary | ICD-10-CM | POA: Diagnosis not present

## 2024-01-30 DIAGNOSIS — K219 Gastro-esophageal reflux disease without esophagitis: Secondary | ICD-10-CM | POA: Diagnosis not present

## 2024-01-30 DIAGNOSIS — H9193 Unspecified hearing loss, bilateral: Secondary | ICD-10-CM | POA: Diagnosis not present

## 2024-01-30 DIAGNOSIS — G301 Alzheimer's disease with late onset: Secondary | ICD-10-CM | POA: Diagnosis not present

## 2024-01-30 DIAGNOSIS — G4733 Obstructive sleep apnea (adult) (pediatric): Secondary | ICD-10-CM | POA: Diagnosis not present

## 2024-01-30 DIAGNOSIS — I10 Essential (primary) hypertension: Secondary | ICD-10-CM | POA: Diagnosis not present

## 2024-01-30 DIAGNOSIS — R3989 Other symptoms and signs involving the genitourinary system: Secondary | ICD-10-CM | POA: Diagnosis not present

## 2024-03-04 DIAGNOSIS — N400 Enlarged prostate without lower urinary tract symptoms: Secondary | ICD-10-CM | POA: Diagnosis not present

## 2024-03-04 DIAGNOSIS — N35011 Post-traumatic bulbous urethral stricture: Secondary | ICD-10-CM | POA: Diagnosis not present

## 2024-03-25 DIAGNOSIS — H903 Sensorineural hearing loss, bilateral: Secondary | ICD-10-CM | POA: Diagnosis not present

## 2024-05-01 ENCOUNTER — Ambulatory Visit (INDEPENDENT_AMBULATORY_CARE_PROVIDER_SITE_OTHER): Admitting: Adult Health

## 2024-05-01 ENCOUNTER — Encounter: Payer: Self-pay | Admitting: Adult Health

## 2024-05-01 VITALS — BP 109/62 | HR 54 | Ht 65.5 in | Wt 204.6 lb

## 2024-05-01 DIAGNOSIS — G4733 Obstructive sleep apnea (adult) (pediatric): Secondary | ICD-10-CM | POA: Diagnosis not present

## 2024-05-01 DIAGNOSIS — F0280 Dementia in other diseases classified elsewhere without behavioral disturbance: Secondary | ICD-10-CM

## 2024-05-01 DIAGNOSIS — G301 Alzheimer's disease with late onset: Secondary | ICD-10-CM | POA: Diagnosis not present

## 2024-05-01 NOTE — Patient Instructions (Signed)
 Your Plan:  Continue Aricept  and Namenda  Evaluate mask to see if it is leaking and needs to be tighter.  Also evaluate if he is a mouth breather may need to try a fullface mask in the future    Thank you for coming to see us  at [facility]. I hope we have been able to provide you high quality care today.  You may receive a patient satisfaction survey over the next few weeks. We would appreciate your feedback and comments so that we may continue to improve ourselves and the health of our patients.

## 2024-05-01 NOTE — Progress Notes (Signed)
 PATIENT: [name] DOB: 04-14-1941  REASON FOR VISIT: follow up HISTORY FROM: patient  Chief Complaint  Patient presents with   Follow-up    Rm 19, wife.  Cpap, ok, no concerns. Memory worsening.  Last MMSE 13.     HISTORY OF PRESENT ILLNESS: Today 05/01/24:  [name] Maiden is a 83 y.o. male with a history of obstructive sleep apnea and Alzheimer's disease. Returns today for follow-up.  Overall his wife feels that his memory is a little worse.  He is able to complete all ADLs independently but may have to be reminded at times.  He does not operate a motor vehicle.  Wife manages his medications appointments and finances.  She denies any significant changes in his mood or behavior other than at times he can get angry but not physical.  He remains on Aricept  and Namenda .  His CPAP report is below.  He currently wears the nasal pillows.  He does have a significant leak but the patient is not aware.  Wife states that occasionally she has heard the CPAP.  She is not sure if he is a mouth breather.  He returns today for an evaluation.    01/12/23: [name] is a 83 y.o. male with a history of obstructive sleep apnea on CPAP and Alzheimer's disease. Returns today for follow-up.   Obstructive sleep apnea on ASV: DL is below.   Alzheimer's disease: Patient his wife feel that his memory has remained stable.  He continues to live at home with his wife.  Able to complete all ADLs independently.  His wife continues to manage his medications, appointments and finances.  He remains on Aricept  and Namenda .  Wife drives to most places. She states that he may park the car. No change in mood or behavior.      01/11/22: [name] is an 83 year old male with a history of obstructive sleep apnea on CPAP and Alzheimer's dementia.SABRA  He returns today for follow-up.  OSA on ASV: Download shows residual AHI slightly elevated.  Patient does have a high leak. Wife states he is due for new supplies.    Memory: Feels that memory is the same. Lives at home with his wife. Able to complete ADLS independently. Operates a Librarian, academic. Wife states that he sometimes will forget streets but she is always with him. Never drives alone. She doesn't notice any safety issues. Wife manages meds, appointments, and finances. Eating ok. Continues on namenda  and Aricept .     07/12/21: [name] is an 83 year old male with a history of obstructive sleep apnea on ASV and Alzheimer's disease.  OSA on ASV: CPAP compliance is excellent.  Patient denies any new issues.  States that he is due for new supplies.  Alzheimer's disease: Patient lives at home with his wife.  He does feel that his memory has gotten slightly worse.  He is able to complete all ADLs independently.  Reports that he still operates a motor vehicle without difficulty.  Denies any changes in mood or behavior.  His wife assists with his medications and appointments.  Although she states that he can handle this without her help.  The wife manages the finances this is unchanged.  He is taking Aricept  5 mg at bedtime.  The wife thinks that they may actually be taking 10 mg she will check the dosage at home.    07/06/20: [name] is an 83 year old male with a history of obstructive sleep apnea on CPAP.  His download indicates that he use his machine nightly for compliance of 100%.  He uses machine greater than 4 hours each night.  On average he uses his machine 8 hours and 5 minutes.  His residual AHI is 9.9 on 5 to 15 cm of water with EPR 3.  At the last visit we increased his pressure 5-10 but his apnea has worsened.  According to his download the majority of his events are central.  Therefore we may need to consider BiPAP therapy.  HISTORY  12/31/19:   [name] is a 83 year old male with a history of obstructive sleep apnea on CPAP.  He returns today for follow-up.  His download indicates that he uses machine 29 out of 30 days for compliance of 97%.   He uses machine greater than 4 hours 28 days for compliance of 93%.  On average he uses his machine 7 hours and 44 minutes.  His residual AHI is 8.4 on 5 to 10 cm of water with EPR 3.  Leak in the 95th percentile is 35.4 L/min.  Pressure in the 95th percentile is 9.3 and maximum pressure of 9.8.  Reports that he is tolerating the machine better.  He has found it beneficial.  REVIEW OF SYSTEMS: Out of a complete 14 system review of symptoms, the patient complains only of the following symptoms, and all other reviewed systems are negative.  ESS 4 FSS 17  ALLERGIES: Allergies  Allergen Reactions   Carisoprodol     Other Reaction(s): Unknown   Meclizine      Other Reaction(s): Elevated  BPH sxs   Other     Other Reaction(s): Unknown   Sulfa Antibiotics Nausea Only and Rash   Sulfamethoxazole Nausea Only and Rash   Sulfasalazine Nausea Only and Rash    HOME MEDICATIONS: Outpatient Medications Prior to Visit  Medication Sig Dispense Refill   acetaminophen  (TYLENOL ) 500 MG tablet Take 1,000 mg by mouth every 6 (six) hours as needed for mild pain or moderate pain.     aspirin  81 MG tablet Take 81 mg by mouth daily.     bifidobacterium infantis (ALIGN) capsule Take 1 capsule by mouth daily.     Calcium  Carb-Cholecalciferol  (CALCIUM  500 + D) 500-5 MG-MCG TABS Take 1 tablet by mouth 2 (two) times daily for 14 days. 28 tablet 0   desmopressin (DDAVP) 0.2 MG tablet Take 0.4 mg by mouth at bedtime.   0   donepezil  (ARICEPT ) 5 MG tablet TAKE 1 TABLET BY MOUTH EVERYDAY AT BEDTIME 90 tablet 3   FERREX 150 150 MG capsule Take 150 mg by mouth daily.     gabapentin (NEURONTIN) 100 MG capsule TAKE 1 CAPSULE BY MOUTH EVERYDAY AT BEDTIME  1   meclizine  (ANTIVERT ) 25 MG tablet Take 25 mg by mouth daily as needed.     memantine  (NAMENDA ) 10 MG tablet TAKE 1 TABLET BY MOUTH TWICE A DAY 180 tablet 4   Multiple Vitamins-Minerals (CENTRUM SILVER PO) Take 1 tablet by mouth daily.     nitroGLYCERIN  (NITROSTAT )  0.4 MG SL tablet Place 1 tablet (0.4 mg total) under the tongue every 5 (five) minutes as needed for chest pain. 30 tablet 0   ondansetron  (ZOFRAN ) 4 MG tablet Take 1 tablet (4 mg total) by mouth every 6 (six) hours. 12 tablet 0   Polyethyl Glycol-Propyl Glycol (SYSTANE OP) Apply 2 drops to eye daily as needed (Dry eyes). 2 drops each eye     rOPINIRole (REQUIP) 1 MG tablet Take  2 tablets by mouth at bedtime.      rosuvastatin  (CRESTOR ) 20 MG tablet Take 20 mg by mouth daily.     potassium chloride  (KLOR-CON ) 10 MEQ tablet Take 1 tablet (10 mEq total) by mouth daily for 7 days. (Patient not taking: Reported on 05/01/2024) 7 tablet 0   No facility-administered medications prior to visit.    PAST MEDICAL HISTORY: Past Medical History:  Diagnosis Date   Acid reflux    a. well-controlled with ranitidine.   BPH (benign prostatic hyperplasia)    a. s/p TURP 2012.   Colon polyps    COVID-19    Diastolic dysfunction    a. 10/2018 Echo: EF >65%. Impaired relaxation.  Nl RV fxn.   Dizziness    Edema leg    Essential hypertension 09/10/2013   Eye problem    a. L eye blurred vision - seeing eye doctor for tissue pulling away from eye.   Hard of hearing    Hiatal hernia    a. Small-mod by CXR 2015.   High cholesterol    Non-obstructive CAD (coronary artery disease)    a. 11/2000: LM nl, LAD min irregs, LCX nl, RCA min irregs; b. 08/2013 Cor CTA: LM nl, LAD 0-25ost, 25-50p, D1 0-25, LCX 0-25, RCA dominant, 25-31m, RCA <25d, RPLV 0-25.   OSA (obstructive sleep apnea)    Prostatitis    Pulmonary nodules    Shoulder pain    Sinus bradycardia    a. 07/2019 Zio: SB to sinus rhythm, 38-49.  No AV block, pauses, prolonged/sustained arrhythmias.    PAST SURGICAL HISTORY: Past Surgical History:  Procedure Laterality Date   CORONARY ANGIOPLASTY     EYE SURGERY     PARS PLANA VITRECTOMY Left 08/29/2013   WITH MEMBRANE PEEL, 23 GAUGE   TRANSURETHRAL RESECTION OF PROSTATE     TRANSURETHRAL RESECTION  OF PROSTATE     Urethral stricture surgery     VASTECTOMY      FAMILY HISTORY: Family History  Problem Relation Age of Onset   Other Mother        Died at 11 - old age   Stroke Father        Died at 26   Dementia Brother    Stroke Brother    Tuberculosis Paternal Grandfather    Sleep apnea Son    CAD Neg Hx     SOCIAL HISTORY: Social History   Socioeconomic History   Marital status: Married    Spouse name: June   Number of children: Not on file   Years of education: Not on file   Highest education level: Not on file  Occupational History   Not on file  Tobacco Use   Smoking status: Never   Smokeless tobacco: Never  Vaping Use   Vaping status: Never Used  Substance and Sexual Activity   Alcohol  use: Yes    Comment: Once per month   Drug use: No   Sexual activity: Not on file  Other Topics Concern   Not on file  Social History Narrative   Not on file   Social Drivers of Health   Financial Resource Strain: Not on file  Food Insecurity: Not on file  Transportation Needs: Not on file  Physical Activity: Not on file  Stress: Not on file  Social Connections: Not on file  Intimate Partner Violence: Not on file      PHYSICAL EXAM  Vitals:   05/01/24 1104  BP: 109/62  Pulse: ROLLEN)  54  Weight: 204 lb 9.6 oz (92.8 kg)  Height: 5' 5.5 (1.664 m)     Body mass index is 33.53 kg/m.     01/12/2023   11:04 AM 01/11/2022   10:54 AM 07/12/2021   10:10 AM  MMSE - Mini Mental State Exam  Orientation to time 0 1 2  Orientation to Place 2 2 3   Registration 3 3 3   Attention/ Calculation 0 1 1  Recall 0 0 0  Language- name 2 objects 2 2 2   Language- repeat 1 1 1   Language- follow 3 step command 3 3 3   Language- read & follow direction 1 1 1   Write a sentence 1 1 1   Copy design 0 1 0  Total score 13 16 17      Generalized: Well developed, in no acute distress  Chest: Lungs clear to auscultation bilaterally  Neurological examination  Mentation: Alert  oriented to time, place, history taking. Follows all commands speech and language fluent Cranial nerve II-XII: Extraocular movements were full, visual field were full on confrontational test Head turning and shoulder shrug  were normal and symmetric. Motor: The motor testing reveals 5 over 5 strength of all 4 extremities. Good symmetric motor tone is noted throughout.  Sensory: Sensory testing is intact to soft touch on all 4 extremities. No evidence of extinction is noted.  Gait and station: uses a cane when ambulating.    DIAGNOSTIC DATA (LABS, IMAGING, TESTING) - I reviewed patient records, labs, notes, testing and imaging myself where available.  Lab Results  Component Value Date   WBC 8.1 10/27/2023   HGB 15.6 10/27/2023   HCT 47.4 10/27/2023   MCV 96.7 10/27/2023   PLT 246 10/27/2023      Component Value Date/Time   NA 142 10/27/2023 1503   NA 138 10/31/2018 0950   K 4.0 10/27/2023 1503   CL 108 10/27/2023 1503   CO2 24 10/27/2023 1503   GLUCOSE 108 (H) 10/27/2023 1503   BUN 14 10/27/2023 1503   BUN 16 10/31/2018 0950   CREATININE 0.97 10/27/2023 1503   CALCIUM  9.2 10/27/2023 1503   PROT 4.0 (L) 06/28/2023 1905   PROT 6.0 02/10/2014 0956   ALBUMIN 2.4 (L) 06/28/2023 1905   ALBUMIN 4.5 02/10/2014 0956   AST 20 06/28/2023 1905   ALT 20 06/28/2023 1905   ALKPHOS 35 (L) 06/28/2023 1905   BILITOT 0.5 06/28/2023 1905   GFRNONAA >60 10/27/2023 1503   GFRAA 82 10/31/2018 0950   Lab Results  Component Value Date   CHOL 134 11/27/2023   HDL 56 11/27/2023   LDLCALC 62 11/27/2023   TRIG 82 11/27/2023   CHOLHDL 2.4 11/27/2023     ASSESSMENT AND PLAN 83 y.o. year old male  has a past medical history of Acid reflux, BPH (benign prostatic hyperplasia), Colon polyps, COVID-19, Diastolic dysfunction, Dizziness, Edema leg, Essential hypertension (09/10/2013), Eye problem, Hard of hearing, Hiatal hernia, High cholesterol, Non-obstructive CAD (coronary artery disease), OSA  (obstructive sleep apnea), Prostatitis, Pulmonary nodules, Shoulder pain, and Sinus bradycardia. here with:  OSA on CPAP  - CPAP compliance excellent -Residual AHI slightly elevated most likely due to mask leakage.  Wife will help him put his mask on tonight to ensure that it is put on appropriately.  She will also evaluate if he is a mouth breather.  May need to consider fullface mask in the future   2.  Alzheimer's disease  -Continue Aricept  5 mg at bedtime (low HR) - Continue  Namenda  10 mg BID  Advised if symptoms worsen or he develops new symptoms they should let us  know. Follow-up in 1 year  or sooner if needed    Duwaine Russell, MSN, NP-C 05/01/2024, 11:15 AM [facility] [address] [place], [place] [address] [phone]  The patient's condition requires frequent monitoring and adjustments in the treatment plan, reflecting the ongoing complexity of care.  This provider is the continuing focal point for all needed services for this condition.

## 2024-05-27 DIAGNOSIS — L905 Scar conditions and fibrosis of skin: Secondary | ICD-10-CM | POA: Diagnosis not present

## 2024-05-27 DIAGNOSIS — Z85828 Personal history of other malignant neoplasm of skin: Secondary | ICD-10-CM | POA: Diagnosis not present

## 2024-05-27 DIAGNOSIS — L570 Actinic keratosis: Secondary | ICD-10-CM | POA: Diagnosis not present

## 2024-05-27 DIAGNOSIS — D225 Melanocytic nevi of trunk: Secondary | ICD-10-CM | POA: Diagnosis not present

## 2024-05-27 DIAGNOSIS — L821 Other seborrheic keratosis: Secondary | ICD-10-CM | POA: Diagnosis not present

## 2024-05-27 DIAGNOSIS — D2372 Other benign neoplasm of skin of left lower limb, including hip: Secondary | ICD-10-CM | POA: Diagnosis not present

## 2024-06-15 DIAGNOSIS — Z23 Encounter for immunization: Secondary | ICD-10-CM | POA: Diagnosis not present

## 2024-07-01 ENCOUNTER — Encounter: Payer: Self-pay | Admitting: Physician Assistant

## 2024-07-01 ENCOUNTER — Ambulatory Visit: Admitting: Physician Assistant

## 2024-07-01 ENCOUNTER — Other Ambulatory Visit: Payer: Self-pay

## 2024-07-01 DIAGNOSIS — M25561 Pain in right knee: Secondary | ICD-10-CM | POA: Diagnosis not present

## 2024-07-01 DIAGNOSIS — G8929 Other chronic pain: Secondary | ICD-10-CM | POA: Diagnosis not present

## 2024-07-01 DIAGNOSIS — M7061 Trochanteric bursitis, right hip: Secondary | ICD-10-CM

## 2024-07-01 MED ORDER — METHYLPREDNISOLONE ACETATE 40 MG/ML IJ SUSP
40.0000 mg | INTRAMUSCULAR | Status: AC | PRN
  Administered 2024-07-01: 40 mg via INTRA_ARTICULAR

## 2024-07-01 MED ORDER — LIDOCAINE HCL 1 % IJ SOLN
3.0000 mL | INTRAMUSCULAR | Status: AC | PRN
  Administered 2024-07-01: 3 mL

## 2024-07-01 NOTE — Progress Notes (Signed)
   Procedure Note  Patient: [name]             Date of Birth: 05-01-1941           MRN: [id number]             Visit Date: 07/01/2024 HPI: Mr. [name] comes in today requesting a cortisone injection of his right knee and also an injection for right hip trochanteric bursitis.   He states pains came back to both areas over the last 1 to 2 weeks.  Last injections for the right knee and right hip were given by Dr. Vernetta on 01/19/2022.  He has tried Tylenol  which helps some.  No new injuries.  Nondiabetic  Review of systems: Denies fevers chills or ongoing infections.  Physical exam: General Well-developed well-nourished male ambulates with a cane. Bilateral hips: Good range of motion of both hips.  Tenderness over the right hip at the trochanteric region.  No tenderness over the left hip trochanteric region. Right knee: No abnormal warmth erythema.  No effusion.  Good range of motion.  Global tenderness.  No instability valgus varus stressing.  Right knee 2 views: No acute fractures.  Knee is well located.  Mild changes patellofemoral and mild medial joint line narrowing.  No acute findings.  Procedures: Visit Diagnoses:  1. Chronic pain of right knee   2. Trochanteric bursitis, right hip     Large Joint Inj: R knee on 07/01/2024 1:42 PM Indications: pain Details: 22 G 1.5 in needle, anterolateral approach  Arthrogram: No  Medications: 3 mL lidocaine  1 %; 40 mg methylPREDNISolone  acetate 40 MG/ML Outcome: tolerated well, no immediate complications Procedure, treatment alternatives, risks and benefits explained, specific risks discussed. Consent was given by the patient. Immediately prior to procedure a time out was called to verify the correct patient, procedure, equipment, support staff and site/side marked as required. Patient was prepped and draped in the usual sterile fashion.    Large Joint Inj: R greater trochanter on 07/01/2024 1:42 PM Indications: pain Details: 22 G 1.5 in  needle, lateral approach  Arthrogram: No  Medications: 3 mL lidocaine  1 %; 40 mg methylPREDNISolone  acetate 40 MG/ML Outcome: tolerated well, no immediate complications Procedure, treatment alternatives, risks and benefits explained, specific risks discussed. Consent was given by the patient. Immediately prior to procedure a time out was called to verify the correct patient, procedure, equipment, support staff and site/side marked as required. Patient was prepped and draped in the usual sterile fashion.

## 2024-07-16 DIAGNOSIS — Z23 Encounter for immunization: Secondary | ICD-10-CM | POA: Diagnosis not present

## 2024-08-11 ENCOUNTER — Other Ambulatory Visit: Payer: Self-pay | Admitting: Adult Health

## 2024-09-16 NOTE — Progress Notes (Unsigned)
 " Cardiology Office Note   Date:  09/18/2024  ID:  Donald, Beasley November 10, 1940, MRN [id number] PCP: [name], MD  [facility] Providers Cardiologist:  Oneil Parchment, MD   History of Present Illness [name] is a 84 y.o. male who returns for a follow-up appointment.  History includes a [name] emergency room visit on October 27, 2023 for chest pain.  Seen by Dr. Anner in consultation.  Troponins were negative x 2.  Chest x-ray showed no acute disease.  EKG was personally reviewed and demonstrated sinus bradycardia without acute changes.  He was discharged home with plans for outpatient follow-up.  He was last seen by Glendia Ferrier, PA-C November 03, 2023.  At that time he went to the emergency room with chest discomfort characterized by shortness of breath and pain radiating to his left shoulder.  Episode began around noon and lasted approximately 3 to 4 hours for which point he received treatment in the emergency room putting lidocaine  patch and nitroglycerin .  Since his emergency room visit he had persistent left-sided chest discomfort described as a little discomfort and rated it as 5 or 6 out of 10.  Uncertain about exact onset of pain and has difficulty recalling if the pain ever completely resolved.  The discomfort was more pronounced on the left side and he noted pain in his back.  No recent episodes of shortness of breath, syncope or leg swelling.  No difficulty breathing when laying flat, hemoptysis or changes in bowel or urine habits.  He has a history of memory issues for which she finds it challenging to describe his symptoms accurately.  Today, he presents with a hx of coronary artery disease who presents for follow-up after a previous emergency room visit for chest discomfort. He was referred by [name] for follow-up after an emergency room visit.  In March, he had chest discomfort leading to an emergency room visit. CT coronary angiography showed no  significant obstructive disease requiring stenting. Since then he has had no chest pain or resting shortness of breath. He notes rare exertional dyspnea with stair climbing that resolves quickly. He denies peripheral edema or palpitations.  He has dizziness less than once a month in the setting of known vertigo and takes meclizine .  His medications are rosuvastatin  20 mg daily, aspirin  81 mg daily, and nitroglycerin  as needed, which he has not used recently. Blood pressure has been stable. Recent labs show LDL 57, triglycerides 91, and total cholesterol 128.  He walks intermittently but knee weakness limits sustained physical activity.  Reports no shortness of breath nor dyspnea on exertion. Reports no chest pain, pressure, or tightness. No edema, orthopnea, PND. Reports no palpitations.   Discussed the use of AI scribe software for clinical note transcription with the patient, who gave verbal consent to proceed.  ROS: Pertinent ROS in HPI  Studies Reviewed     CT coronary, FFR 11/22/2023 FINDINGS: FFRct analysis was performed on the original cardiac CT angiogram dataset. Diagrammatic representation of the FFRct analysis is provided in a separate PDF document in PACS. This dictation was created using the PDF document and an interactive 3D model of the results. 3D model is not available in the EMR/PACS. Normal FFR range is >0.80.   1. Left Main: findings   2. LAD: findings 0.96   diagonal: 0.90   3. LCX: findings 0.91, 0.87 0.86   4.      RCA: findings 0.92, 0.89, 0.86  IMPRESSION: FFR not c/w significant obstructive disease.   Note: These examples are not recommendations of [employer] and only provided as examples of what other customers are doing.     Electronically Signed   By: Redell Pietro HERO     Physical Exam VS:  BP (!) 106/56   Pulse (!) 57   Ht 5' 9 (1.753 m)   Wt 202 lb (91.6 kg)   SpO2 96%   BMI 29.83 kg/m        Wt Readings from Last 3 Encounters:   09/18/24 202 lb (91.6 kg)  05/01/24 204 lb 9.6 oz (92.8 kg)  11/03/23 206 lb (93.4 kg)    GEN: Well nourished, well developed in no acute distress NECK: No JVD; No carotid bruits CARDIAC: RRR, no murmurs, rubs, gallops RESPIRATORY:  Clear to auscultation without rales, wheezing or rhonchi  ABDOMEN: Soft, non-tender, non-distended EXTREMITIES:  No edema; No deformity   ASSESSMENT AND PLAN  Coronary artery disease with angina pectoris Coronary artery disease confirmed on CT, no significant blockages. No recent angina or dyspnea. Bradycardia asymptomatic. - Continue aspirin  81 mg daily. - Continue rosuvastatin  20 mg daily. - Use nitroglycerin  as needed for chest pain.  Essential hypertension Blood pressure stable, no symptoms of hypotension. - Continue current antihypertensive regimen.  Pure hypercholesterolemia Cholesterol levels controlled, LDL, triglycerides, and total cholesterol within target. - Continue rosuvastatin  20 mg daily.      Dispo: He can follow-up in 6 months with MD  Signed, Orren LOISE Fabry, PA-C   "

## 2024-09-18 ENCOUNTER — Encounter: Payer: Self-pay | Admitting: Physician Assistant

## 2024-09-18 ENCOUNTER — Ambulatory Visit: Attending: Physician Assistant | Admitting: Physician Assistant

## 2024-09-18 VITALS — BP 106/56 | HR 57 | Ht 69.0 in | Wt 202.0 lb

## 2024-09-18 DIAGNOSIS — I25119 Atherosclerotic heart disease of native coronary artery with unspecified angina pectoris: Secondary | ICD-10-CM | POA: Insufficient documentation

## 2024-09-18 DIAGNOSIS — I10 Essential (primary) hypertension: Secondary | ICD-10-CM | POA: Insufficient documentation

## 2024-09-18 DIAGNOSIS — R079 Chest pain, unspecified: Secondary | ICD-10-CM | POA: Insufficient documentation

## 2024-09-18 DIAGNOSIS — E7800 Pure hypercholesterolemia, unspecified: Secondary | ICD-10-CM | POA: Diagnosis present

## 2024-09-18 MED ORDER — NITROGLYCERIN 0.4 MG SL SUBL: 0.4000 mg | SUBLINGUAL_TABLET | SUBLINGUAL | 3 refills | Status: AC | PRN

## 2024-09-18 NOTE — Patient Instructions (Signed)
 Medication Instructions:  Your physician recommends that you continue on your current medications as directed. Please refer to the Current Medication list given to you today. *If you need a refill on your cardiac medications before your next appointment, please call your pharmacy*  Lab Work: None ordered If you have labs (blood work) drawn today and your tests are completely normal, you will receive your results only by: MyChart Message (if you have MyChart) OR A paper copy in the mail If you have any lab test that is abnormal or we need to change your treatment, we will call you to review the results.  Testing/Procedures: None ordered  Follow-Up: At [facility], you and your health needs are our priority.  As part of our continuing mission to provide you with exceptional heart care, our providers are all part of one team.  This team includes your primary Cardiologist (physician) and Advanced Practice Providers or APPs (Physician Assistants and Nurse Practitioners) who all work together to provide you with the care you need, when you need it.  Your next appointment:   6 month(s)  Provider:   Oneil Parchment, MD    We recommend signing up for the patient portal called MyChart.  Sign up information is provided on this After Visit Summary.  MyChart is used to connect with patients for Virtual Visits (Telemedicine).  Patients are able to view lab/test results, encounter notes, upcoming appointments, etc.  Non-urgent messages can be sent to your provider as well.   To learn more about what you can do with MyChart, go to [email or web address].   Other Instructions

## 2025-05-01 ENCOUNTER — Ambulatory Visit: Admitting: Adult Health
